# Patient Record
Sex: Female | Born: 1945 | Race: White | Hispanic: No | Smoking: Never smoker
Health system: Southern US, Community
[De-identification: ages and names within clinical notes are randomized; demographics above are authoritative.]

## PROBLEM LIST (undated history)

## (undated) DIAGNOSIS — M8580 Other specified disorders of bone density and structure, unspecified site: Secondary | ICD-10-CM

## (undated) DIAGNOSIS — Z9889 Other specified postprocedural states: Secondary | ICD-10-CM

## (undated) DIAGNOSIS — E785 Hyperlipidemia, unspecified: Secondary | ICD-10-CM

## (undated) DIAGNOSIS — I3139 Other pericardial effusion (noninflammatory): Secondary | ICD-10-CM

## (undated) DIAGNOSIS — J302 Other seasonal allergic rhinitis: Secondary | ICD-10-CM

## (undated) DIAGNOSIS — E119 Type 2 diabetes mellitus without complications: Secondary | ICD-10-CM

## (undated) DIAGNOSIS — I313 Pericardial effusion (noninflammatory): Secondary | ICD-10-CM

## (undated) DIAGNOSIS — R112 Nausea with vomiting, unspecified: Secondary | ICD-10-CM

## (undated) HISTORY — DX: Hyperlipidemia, unspecified: E78.5

## (undated) HISTORY — DX: Type 2 diabetes mellitus without complications: E11.9

## (undated) HISTORY — PX: OTHER SURGICAL HISTORY: SHX169

## (undated) HISTORY — DX: Other specified disorders of bone density and structure, unspecified site: M85.80

## (undated) HISTORY — DX: Other seasonal allergic rhinitis: J30.2

---

## 1999-10-08 ENCOUNTER — Other Ambulatory Visit: Admission: RE | Admit: 1999-10-08 | Discharge: 1999-10-08 | Payer: Self-pay | Admitting: Obstetrics and Gynecology

## 2001-01-06 ENCOUNTER — Other Ambulatory Visit: Admission: RE | Admit: 2001-01-06 | Discharge: 2001-01-06 | Payer: Self-pay | Admitting: Obstetrics and Gynecology

## 2002-01-23 ENCOUNTER — Other Ambulatory Visit: Admission: RE | Admit: 2002-01-23 | Discharge: 2002-01-23 | Payer: Self-pay | Admitting: Obstetrics and Gynecology

## 2003-02-06 ENCOUNTER — Other Ambulatory Visit: Admission: RE | Admit: 2003-02-06 | Discharge: 2003-02-06 | Payer: Self-pay | Admitting: Obstetrics and Gynecology

## 2004-02-25 ENCOUNTER — Other Ambulatory Visit: Admission: RE | Admit: 2004-02-25 | Discharge: 2004-02-25 | Payer: Self-pay | Admitting: Obstetrics and Gynecology

## 2011-02-23 ENCOUNTER — Other Ambulatory Visit (HOSPITAL_COMMUNITY): Payer: Managed Care, Other (non HMO)

## 2011-03-04 ENCOUNTER — Encounter (HOSPITAL_COMMUNITY): Admission: RE | Payer: Self-pay | Source: Ambulatory Visit

## 2011-03-04 ENCOUNTER — Ambulatory Visit (HOSPITAL_COMMUNITY)
Admission: RE | Admit: 2011-03-04 | Payer: Managed Care, Other (non HMO) | Source: Ambulatory Visit | Admitting: Obstetrics and Gynecology

## 2011-03-04 SURGERY — DILATATION & CURETTAGE/HYSTEROSCOPY WITH RESECTOCOPE
Anesthesia: General

## 2011-08-25 ENCOUNTER — Other Ambulatory Visit: Payer: Self-pay | Admitting: Family Medicine

## 2011-08-25 DIAGNOSIS — R109 Unspecified abdominal pain: Secondary | ICD-10-CM

## 2011-08-30 ENCOUNTER — Other Ambulatory Visit: Payer: Managed Care, Other (non HMO)

## 2012-02-16 ENCOUNTER — Encounter (HOSPITAL_COMMUNITY): Payer: Self-pay | Admitting: Pharmacy Technician

## 2012-02-16 ENCOUNTER — Encounter: Payer: Self-pay | Admitting: Cardiothoracic Surgery

## 2012-02-16 ENCOUNTER — Ambulatory Visit (INDEPENDENT_AMBULATORY_CARE_PROVIDER_SITE_OTHER): Payer: Medicare Other | Admitting: Cardiothoracic Surgery

## 2012-02-16 ENCOUNTER — Other Ambulatory Visit: Payer: Self-pay | Admitting: *Deleted

## 2012-02-16 VITALS — BP 107/71 | HR 72 | Resp 18 | Ht 64.25 in | Wt 113.0 lb

## 2012-02-16 DIAGNOSIS — I313 Pericardial effusion (noninflammatory): Secondary | ICD-10-CM

## 2012-02-16 DIAGNOSIS — J302 Other seasonal allergic rhinitis: Secondary | ICD-10-CM | POA: Insufficient documentation

## 2012-02-16 DIAGNOSIS — I319 Disease of pericardium, unspecified: Secondary | ICD-10-CM

## 2012-02-16 DIAGNOSIS — M8580 Other specified disorders of bone density and structure, unspecified site: Secondary | ICD-10-CM | POA: Insufficient documentation

## 2012-02-16 DIAGNOSIS — I3139 Other pericardial effusion (noninflammatory): Secondary | ICD-10-CM | POA: Insufficient documentation

## 2012-02-16 NOTE — Progress Notes (Signed)
PCP is Lupita Raider, MD Referring Provider is Donato Schultz, MD  Chief Complaint  Patient presents with  . Pericardial Effusion    Referral from Dr Anne Fu for surgical eval, Chest Ct on 02/15/12     HPI: 66 year old Caucasian female with no history of malignancy, history of diabetes and hypertension was evaluated by her primary care physician for severe episodic nausea. Ultrasound of the abdomen was negative but did indicate evidence of pericardial fluid. A followup CT scan of the chest showed a large circumferential pericardial effusion. There were no pulmonary nodules, there is no mediastinal adenopathy or other sign of malignancy. There is no evidence of pleural effusion. The patient was evaluated by Dr. Anne Fu and a 2-D echocardiogram confirmed the presence of pericardial effusion without tamponade. She was referred for evaluation for pericardial window and drainage.  Patient denies chest pain, weight loss, fever, cough, symptoms of viral prodrome Patient's husband died earlier this summer of stroke and dialysis-dependent renal failure. He history of hepatitis. The patient herself has no history of hepatitis Her diabetes is fairly well controlled last A1c was 6.9   Past Medical History  Diagnosis Date  . Diabetes mellitus   . Hyperlipidemia   . Seasonal allergies   . Osteopenia     Past Surgical History  Procedure Date  . Right wrist fx   . Rectal polyps removed     Family History  Problem Relation Age of Onset  . Heart attack Father   . Diabetes Brother   . Heart disease Brother   . Cancer Sister     ovarian    Social History History  Substance Use Topics  . Smoking status: Never Smoker   . Smokeless tobacco: Not on file  . Alcohol Use: No    Current Outpatient Prescriptions  Medication Sig Dispense Refill  . insulin glargine (LANTUS) 100 UNIT/ML injection Inject 7 Units into the skin at bedtime.      Marland Kitchen lisinopril (PRINIVIL,ZESTRIL) 5 MG tablet Take 5 mg by  mouth daily.      . metFORMIN (GLUCOPHAGE) 1000 MG tablet Take 1,000 mg by mouth 2 (two) times daily with a meal.      . Multiple Vitamin (MULTIVITAMIN) tablet Take 1 tablet by mouth daily.      . pravastatin (PRAVACHOL) 40 MG tablet Take 40 mg by mouth daily.      . traZODone (DESYREL) 50 MG tablet Take 50 mg by mouth at bedtime.        No Known Allergies  Review of Systems no weight loss fever or change in vision or difficulty swallowing no active dental complaints no history of thoracic trauma History of cardiac arrhythmia angina coronary disease MI CT scan of the chest showed no evidence of aortic pathology or aneurysm No GI history of otitis jaundice blood per No peripheral rash or disease no TIA no DVT no stroke   BP 107/71  Pulse 72  Resp 18  Ht 5' 4.25" (1.632 m)  Wt 113 lb (51.256 kg)  BMI 19.25 kg/m2  SpO2 98% Physical Exam Alert and comfortable HEENT normocephalic dentition good Neck without JVD mass or bruit Lymphatics without palpable adenopathy the neck Thorax without tenderness or deformity breath sounds clear and equal Cardiac rhythm regular murmur rub or gallop Abdomen soft nontender without pulsatile mass no organomegaly Extremities without clubbing cyanosis or edema Vascular palpable pulses in all extremities no varicosities or venous insufficiency Neurologic alert and oriented without focal motor deficit  Diagnostic Tests: The scan of  the chest reviewed, report a 2-D echo performed at [facility] cardiology today is pending but her a report by Dr. 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

## 2012-02-17 MED ORDER — DEXTROSE 5 % IV SOLN
1.5000 g | INTRAVENOUS | Status: AC
  Administered 2012-02-18: 1.5 g via INTRAVENOUS
  Filled 2012-02-17: qty 1.5

## 2012-02-18 ENCOUNTER — Encounter (HOSPITAL_COMMUNITY)
Admission: RE | Disposition: A | Discharge status: Home / Self Care | Payer: Self-pay | Source: Ambulatory Visit | Attending: Cardiothoracic Surgery

## 2012-02-18 ENCOUNTER — Inpatient Hospital Stay (HOSPITAL_COMMUNITY): Payer: Medicare Other | Admitting: Anesthesiology

## 2012-02-18 ENCOUNTER — Inpatient Hospital Stay (HOSPITAL_COMMUNITY): Payer: Medicare Other

## 2012-02-18 ENCOUNTER — Encounter (HOSPITAL_COMMUNITY): Payer: Self-pay | Admitting: *Deleted

## 2012-02-18 ENCOUNTER — Encounter (HOSPITAL_COMMUNITY): Payer: Self-pay | Admitting: Anesthesiology

## 2012-02-18 ENCOUNTER — Inpatient Hospital Stay (HOSPITAL_COMMUNITY)
Admission: RE | Admit: 2012-02-18 | Discharge: 2012-02-23 | DRG: 238 | Disposition: A | Discharge status: Home / Self Care | Payer: Medicare Other | Source: Ambulatory Visit | Attending: Cardiothoracic Surgery | Admitting: Cardiothoracic Surgery

## 2012-02-18 DIAGNOSIS — M899 Disorder of bone, unspecified: Secondary | ICD-10-CM | POA: Diagnosis present

## 2012-02-18 DIAGNOSIS — Z9889 Other specified postprocedural states: Secondary | ICD-10-CM

## 2012-02-18 DIAGNOSIS — I3139 Other pericardial effusion (noninflammatory): Secondary | ICD-10-CM | POA: Diagnosis present

## 2012-02-18 DIAGNOSIS — I313 Pericardial effusion (noninflammatory): Secondary | ICD-10-CM | POA: Diagnosis present

## 2012-02-18 DIAGNOSIS — I319 Disease of pericardium, unspecified: Principal | ICD-10-CM | POA: Diagnosis present

## 2012-02-18 DIAGNOSIS — I441 Atrioventricular block, second degree: Secondary | ICD-10-CM | POA: Diagnosis not present

## 2012-02-18 DIAGNOSIS — I309 Acute pericarditis, unspecified: Secondary | ICD-10-CM

## 2012-02-18 DIAGNOSIS — IMO0001 Reserved for inherently not codable concepts without codable children: Secondary | ICD-10-CM | POA: Diagnosis present

## 2012-02-18 HISTORY — PX: SUBXYPHOID PERICARDIAL WINDOW: SHX5075

## 2012-02-18 HISTORY — DX: Other pericardial effusion (noninflammatory): I31.39

## 2012-02-18 HISTORY — DX: Pericardial effusion (noninflammatory): I31.3

## 2012-02-18 HISTORY — DX: Other specified postprocedural states: Z98.890

## 2012-02-18 HISTORY — DX: Nausea with vomiting, unspecified: R11.2

## 2012-02-18 LAB — COMPREHENSIVE METABOLIC PANEL
ALT: 13 U/L (ref 0–35)
AST: 19 U/L (ref 0–37)
Albumin: 4 g/dL (ref 3.5–5.2)
Alkaline Phosphatase: 68 U/L (ref 39–117)
BUN: 14 mg/dL (ref 6–23)
CO2: 24 mEq/L (ref 19–32)
Calcium: 9.6 mg/dL (ref 8.4–10.5)
Chloride: 102 mEq/L (ref 96–112)
Creatinine, Ser: 0.43 mg/dL — ABNORMAL LOW (ref 0.50–1.10)
GFR calc Af Amer: >90 mL/min (ref >90)
GFR calc non Af Amer: >90 mL/min (ref >90)
Glucose, Bld: 222 mg/dL — ABNORMAL HIGH (ref 70–99)
Potassium: 4.1 mEq/L (ref 3.5–5.1)
Sodium: 138 mEq/L (ref 135–145)
Total Bilirubin: 0.2 mg/dL — ABNORMAL LOW (ref 0.3–1.2)
Total Protein: 7.2 g/dL (ref 6.0–8.3)

## 2012-02-18 LAB — BLOOD GAS, ARTERIAL
Acid-Base Excess: 2.3 mmol/L — ABNORMAL HIGH (ref 0.0–2.0)
Bicarbonate: 26.4 mEq/L — ABNORMAL HIGH (ref 20.0–24.0)
Drawn by: 181601
FIO2: 0.21 %
O2 Saturation: 97.9 %
Patient temperature: 98.6
TCO2: 27.6 mmol/L (ref 0–100)
pCO2 arterial: 41 mmHg (ref 35.0–45.0)
pH, Arterial: 7.424 (ref 7.350–7.450)
pO2, Arterial: 102 mmHg — ABNORMAL HIGH (ref 80.0–100.0)

## 2012-02-18 LAB — PROTIME-INR
INR: 0.96 (ref 0.00–1.49)
Prothrombin Time: 12.7 seconds (ref 11.6–15.2)

## 2012-02-18 LAB — GLUCOSE, CAPILLARY
Glucose-Capillary: 116 mg/dL — ABNORMAL HIGH (ref 70–99)
Glucose-Capillary: 159 mg/dL — ABNORMAL HIGH (ref 70–99)
Glucose-Capillary: 159 mg/dL — ABNORMAL HIGH (ref 70–99)
Glucose-Capillary: 190 mg/dL — ABNORMAL HIGH (ref 70–99)
Glucose-Capillary: 97 mg/dL (ref 70–99)

## 2012-02-18 LAB — CBC
HCT: 34.6 % — ABNORMAL LOW (ref 36.0–46.0)
Hemoglobin: 11.5 g/dL — ABNORMAL LOW (ref 12.0–15.0)
MCH: 29.1 pg (ref 26.0–34.0)
MCHC: 33.2 g/dL (ref 30.0–36.0)
MCV: 87.6 fL (ref 78.0–100.0)
Platelets: 293 10*3/uL (ref 150–400)
RBC: 3.95 MIL/uL (ref 3.87–5.11)
RDW: 13 % (ref 11.5–15.5)
WBC: 6.3 10*3/uL (ref 4.0–10.5)

## 2012-02-18 LAB — URINALYSIS, ROUTINE W REFLEX MICROSCOPIC
Bilirubin Urine: NEGATIVE
Glucose, UA: NEGATIVE mg/dL
Hgb urine dipstick: NEGATIVE
Ketones, ur: NEGATIVE mg/dL
Leukocytes, UA: NEGATIVE
Nitrite: NEGATIVE
Protein, ur: NEGATIVE mg/dL
Specific Gravity, Urine: 1.024 (ref 1.005–1.030)
Urobilinogen, UA: 1 mg/dL (ref 0.0–1.0)
pH: 7 (ref 5.0–8.0)

## 2012-02-18 LAB — SURGICAL PCR SCREEN
MRSA, PCR: NEGATIVE
Staphylococcus aureus: NEGATIVE

## 2012-02-18 LAB — TYPE AND SCREEN
ABO/RH(D): A NEG
Antibody Screen: NEGATIVE

## 2012-02-18 LAB — APTT: aPTT: 27 seconds (ref 24–37)

## 2012-02-18 LAB — ABO/RH: ABO/RH(D): A NEG

## 2012-02-18 SURGERY — CREATION, PERICARDIAL WINDOW, SUBXIPHOID APPROACH
Anesthesia: General | Site: Chest | Wound class: Clean

## 2012-02-18 MED ORDER — GLYCOPYRROLATE 0.2 MG/ML IJ SOLN
INTRAMUSCULAR | Status: DC | PRN
  Administered 2012-02-18: .6 mg via INTRAVENOUS

## 2012-02-18 MED ORDER — BISACODYL 5 MG PO TBEC
10.0000 mg | DELAYED_RELEASE_TABLET | Freq: Every day | ORAL | Status: DC
  Administered 2012-02-18 – 2012-02-23 (×6): 10 mg via ORAL
  Filled 2012-02-18 (×6): qty 2

## 2012-02-18 MED ORDER — ADULT MULTIVITAMIN W/MINERALS CH
1.0000 | ORAL_TABLET | Freq: Every day | ORAL | Status: DC
  Administered 2012-02-19 – 2012-02-23 (×5): 1 via ORAL
  Filled 2012-02-18 (×5): qty 1

## 2012-02-18 MED ORDER — MIDAZOLAM HCL 2 MG/2ML IJ SOLN
1.0000 mg | INTRAMUSCULAR | Status: DC | PRN
  Administered 2012-02-18: 2 mg via INTRAVENOUS

## 2012-02-18 MED ORDER — PANTOPRAZOLE SODIUM 40 MG PO TBEC
40.0000 mg | DELAYED_RELEASE_TABLET | Freq: Every day | ORAL | Status: DC
  Administered 2012-02-18 – 2012-02-23 (×6): 40 mg via ORAL
  Filled 2012-02-18 (×6): qty 1

## 2012-02-18 MED ORDER — ACETAMINOPHEN 10 MG/ML IV SOLN
INTRAVENOUS | Status: AC
  Administered 2012-02-18: 1000 mg via INTRAVENOUS
  Filled 2012-02-18: qty 100

## 2012-02-18 MED ORDER — PROPOFOL 10 MG/ML IV BOLUS
INTRAVENOUS | Status: DC | PRN
  Administered 2012-02-18: 170 mg via INTRAVENOUS

## 2012-02-18 MED ORDER — INSULIN ASPART 100 UNIT/ML ~~LOC~~ SOLN
0.0000 [IU] | SUBCUTANEOUS | Status: DC
  Administered 2012-02-18: 4 [IU] via SUBCUTANEOUS
  Administered 2012-02-18 – 2012-02-19 (×2): 2 [IU] via SUBCUTANEOUS
  Administered 2012-02-19: 4 [IU] via SUBCUTANEOUS
  Administered 2012-02-19: 12 [IU] via SUBCUTANEOUS

## 2012-02-18 MED ORDER — FENTANYL CITRATE 0.05 MG/ML IJ SOLN
INTRAMUSCULAR | Status: AC
  Filled 2012-02-18: qty 2

## 2012-02-18 MED ORDER — OXYCODONE HCL 5 MG PO TABS: 5.0000 mg | ORAL_TABLET | Freq: Once | ORAL | Status: DC | PRN

## 2012-02-18 MED ORDER — POTASSIUM CHLORIDE IN NACL 20-0.9 MEQ/L-% IV SOLN
INTRAVENOUS | Status: DC
  Administered 2012-02-18 – 2012-02-19 (×2): via INTRAVENOUS
  Filled 2012-02-18 (×5): qty 1000

## 2012-02-18 MED ORDER — MIDAZOLAM HCL 2 MG/2ML IJ SOLN: 1.0000 mg | INTRAMUSCULAR | Status: DC | PRN

## 2012-02-18 MED ORDER — MUPIROCIN 2 % EX OINT
TOPICAL_OINTMENT | Freq: Two times a day (BID) | CUTANEOUS | Status: DC
  Administered 2012-02-18: 09:00:00 via NASAL
  Filled 2012-02-18: qty 22

## 2012-02-18 MED ORDER — POTASSIUM CHLORIDE 10 MEQ/50ML IV SOLN: 10.0000 meq | Freq: Every day | INTRAVENOUS | Status: DC | PRN

## 2012-02-18 MED ORDER — LISINOPRIL 5 MG PO TABS
5.0000 mg | ORAL_TABLET | Freq: Every day | ORAL | Status: DC
  Administered 2012-02-20 – 2012-02-23 (×4): 5 mg via ORAL
  Filled 2012-02-18 (×4): qty 1

## 2012-02-18 MED ORDER — FENTANYL CITRATE 0.05 MG/ML IJ SOLN
INTRAMUSCULAR | Status: DC | PRN
  Administered 2012-02-18: 100 ug via INTRAVENOUS
  Administered 2012-02-18: 50 ug via INTRAVENOUS
  Administered 2012-02-18: 25 ug via INTRAVENOUS
  Administered 2012-02-18: 75 ug via INTRAVENOUS

## 2012-02-18 MED ORDER — TRAMADOL HCL 50 MG PO TABS
50.0000 mg | ORAL_TABLET | Freq: Four times a day (QID) | ORAL | Status: DC | PRN
  Administered 2012-02-19: 50 mg via ORAL
  Filled 2012-02-18: qty 1

## 2012-02-18 MED ORDER — FENTANYL CITRATE 0.05 MG/ML IJ SOLN
100.0000 ug | Freq: Once | INTRAMUSCULAR | Status: AC
Start: 2012-02-18 — End: 2012-02-18
  Administered 2012-02-18: 100 ug via INTRAVENOUS

## 2012-02-18 MED ORDER — HYDROMORPHONE HCL PF 1 MG/ML IJ SOLN
0.2500 mg | INTRAMUSCULAR | Status: DC | PRN
  Administered 2012-02-18 (×4): 0.5 mg via INTRAVENOUS

## 2012-02-18 MED ORDER — OXYCODONE HCL 5 MG/5ML PO SOLN: 5.0000 mg | Freq: Once | ORAL | Status: DC | PRN

## 2012-02-18 MED ORDER — MEPERIDINE HCL 25 MG/ML IJ SOLN: 6.2500 mg | INTRAMUSCULAR | Status: DC | PRN

## 2012-02-18 MED ORDER — PROMETHAZINE HCL 25 MG/ML IJ SOLN
INTRAMUSCULAR | Status: AC
  Filled 2012-02-18: qty 1

## 2012-02-18 MED ORDER — LACTATED RINGERS IV SOLN
INTRAVENOUS | Status: DC
  Administered 2012-02-18: 11:00:00 via INTRAVENOUS

## 2012-02-18 MED ORDER — LACTATED RINGERS IV SOLN
INTRAVENOUS | Status: DC | PRN
  Administered 2012-02-18: 11:00:00 via INTRAVENOUS

## 2012-02-18 MED ORDER — NEOSTIGMINE METHYLSULFATE 1 MG/ML IJ SOLN
INTRAMUSCULAR | Status: DC | PRN
  Administered 2012-02-18: 4 mg via INTRAVENOUS

## 2012-02-18 MED ORDER — EPHEDRINE SULFATE 50 MG/ML IJ SOLN
INTRAMUSCULAR | Status: DC | PRN
  Administered 2012-02-18: 10 mg via INTRAVENOUS

## 2012-02-18 MED ORDER — OXYCODONE-ACETAMINOPHEN 5-325 MG PO TABS
1.0000 | ORAL_TABLET | ORAL | Status: DC | PRN
  Administered 2012-02-19 – 2012-02-21 (×6): 2 via ORAL
  Administered 2012-02-22: 1 via ORAL
  Filled 2012-02-18 (×2): qty 2
  Filled 2012-02-18: qty 1
  Filled 2012-02-18 (×6): qty 2

## 2012-02-18 MED ORDER — DIPHENHYDRAMINE HCL 50 MG/ML IJ SOLN
INTRAMUSCULAR | Status: DC | PRN
  Administered 2012-02-18: 12.5 mg via INTRAVENOUS

## 2012-02-18 MED ORDER — PROMETHAZINE HCL 25 MG/ML IJ SOLN
6.2500 mg | INTRAMUSCULAR | Status: DC | PRN
  Administered 2012-02-18: 6.25 mg via INTRAVENOUS

## 2012-02-18 MED ORDER — MIDAZOLAM HCL 2 MG/2ML IJ SOLN
INTRAMUSCULAR | Status: AC
  Filled 2012-02-18: qty 2

## 2012-02-18 MED ORDER — FENTANYL CITRATE 0.05 MG/ML IJ SOLN
25.0000 ug | INTRAMUSCULAR | Status: DC | PRN
  Administered 2012-02-18 – 2012-02-19 (×3): 50 ug via INTRAVENOUS
  Filled 2012-02-18 (×3): qty 2

## 2012-02-18 MED ORDER — LIDOCAINE HCL (CARDIAC) 20 MG/ML IV SOLN
INTRAVENOUS | Status: DC | PRN
  Administered 2012-02-18: 50 mg via INTRAVENOUS

## 2012-02-18 MED ORDER — ONDANSETRON HCL 4 MG/2ML IJ SOLN
INTRAMUSCULAR | Status: DC | PRN
  Administered 2012-02-18: 4 mg via INTRAVENOUS

## 2012-02-18 MED ORDER — ONDANSETRON HCL 4 MG/2ML IJ SOLN
4.0000 mg | Freq: Four times a day (QID) | INTRAMUSCULAR | Status: DC | PRN
  Administered 2012-02-19: 4 mg via INTRAVENOUS
  Filled 2012-02-18 (×2): qty 2

## 2012-02-18 MED ORDER — ACETAMINOPHEN 10 MG/ML IV SOLN
1000.0000 mg | Freq: Four times a day (QID) | INTRAVENOUS | Status: AC
  Administered 2012-02-18 – 2012-02-19 (×4): 1000 mg via INTRAVENOUS
  Filled 2012-02-18 (×2): qty 100

## 2012-02-18 MED ORDER — FENTANYL CITRATE 0.05 MG/ML IJ SOLN: 50.0000 ug | INTRAMUSCULAR | Status: DC | PRN

## 2012-02-18 MED ORDER — ATROPINE SULFATE 0.1 MG/ML IJ SOLN
INTRAMUSCULAR | Status: AC
  Filled 2012-02-18: qty 10

## 2012-02-18 MED ORDER — HYDROMORPHONE HCL PF 1 MG/ML IJ SOLN
INTRAMUSCULAR | Status: AC
  Filled 2012-02-18: qty 1

## 2012-02-18 MED ORDER — SENNOSIDES-DOCUSATE SODIUM 8.6-50 MG PO TABS
1.0000 | ORAL_TABLET | Freq: Every evening | ORAL | Status: DC | PRN
  Administered 2012-02-18: 1 via ORAL
  Filled 2012-02-18: qty 1

## 2012-02-18 MED ORDER — SIMVASTATIN 20 MG PO TABS
20.0000 mg | ORAL_TABLET | Freq: Every day | ORAL | Status: DC
  Administered 2012-02-18 – 2012-02-22 (×5): 20 mg via ORAL
  Filled 2012-02-18 (×6): qty 1

## 2012-02-18 MED ORDER — ROCURONIUM BROMIDE 100 MG/10ML IV SOLN
INTRAVENOUS | Status: DC | PRN
  Administered 2012-02-18: 50 mg via INTRAVENOUS

## 2012-02-18 MED ORDER — ONE-DAILY MULTI VITAMINS PO TABS: 1.0000 | ORAL_TABLET | Freq: Every day | ORAL | Status: DC

## 2012-02-18 MED ORDER — OXYCODONE HCL 5 MG PO TABS
5.0000 mg | ORAL_TABLET | ORAL | Status: AC | PRN
  Administered 2012-02-18 – 2012-02-19 (×5): 10 mg via ORAL
  Filled 2012-02-18 (×5): qty 2

## 2012-02-18 MED ORDER — DEXTROSE 5 % IV SOLN
1.5000 g | Freq: Two times a day (BID) | INTRAVENOUS | Status: AC
  Administered 2012-02-18 – 2012-02-19 (×2): 1.5 g via INTRAVENOUS
  Filled 2012-02-18 (×2): qty 1.5

## 2012-02-18 SURGICAL SUPPLY — 48 items
APL SKNCLS STERI-STRIP NONHPOA (GAUZE/BANDAGES/DRESSINGS) ×1
ATTRACTOMAT 16X20 MAGNETIC DRP (DRAPES) ×3 IMPLANT
BENZOIN TINCTURE PRP APPL 2/3 (GAUZE/BANDAGES/DRESSINGS) ×3 IMPLANT
CANISTER SUCTION 2500CC (MISCELLANEOUS) ×3 IMPLANT
CATH THORACIC 28FR (CATHETERS) ×2 IMPLANT
CATH THORACIC 28FR RT ANG (CATHETERS) IMPLANT
CATH THORACIC 36FR (CATHETERS) IMPLANT
CATH THORACIC 36FR RT ANG (CATHETERS) IMPLANT
CLIP TI MEDIUM 24 (CLIP) ×2 IMPLANT
CLIP TI WIDE RED SMALL 24 (CLIP) ×2 IMPLANT
CLOSURE WOUND 1/2 X4 (GAUZE/BANDAGES/DRESSINGS) ×1
CLOTH BEACON ORANGE TIMEOUT ST (SAFETY) ×3 IMPLANT
CONT SPEC 4OZ CLIKSEAL STRL BL (MISCELLANEOUS) ×4 IMPLANT
COVER SURGICAL LIGHT HANDLE (MISCELLANEOUS) ×6 IMPLANT
DRAIN CHANNEL 28F RND 3/8 FF (WOUND CARE) ×3 IMPLANT
DRAPE LAPAROSCOPIC ABDOMINAL (DRAPES) ×3 IMPLANT
DRAPE PROXIMA HALF (DRAPES) ×3 IMPLANT
ELECT REM PT RETURN 9FT ADLT (ELECTROSURGICAL) ×3
ELECTRODE REM PT RTRN 9FT ADLT (ELECTROSURGICAL) ×1 IMPLANT
GLOVE BIO SURGEON STRL SZ7.5 (GLOVE) ×6 IMPLANT
HEMOSTAT POWDER SURGIFOAM 1G (HEMOSTASIS) IMPLANT
KIT BASIN OR (CUSTOM PROCEDURE TRAY) ×3 IMPLANT
KIT ROOM TURNOVER OR (KITS) ×3 IMPLANT
NS IRRIG 1000ML POUR BTL (IV SOLUTION) ×3 IMPLANT
PACK CHEST (CUSTOM PROCEDURE TRAY) ×3 IMPLANT
PAD ARMBOARD 7.5X6 YLW CONV (MISCELLANEOUS) ×6 IMPLANT
PAD ELECT DEFIB RADIOL ZOLL (MISCELLANEOUS) ×3 IMPLANT
SPONGE GAUZE 4X4 12PLY (GAUZE/BANDAGES/DRESSINGS) ×2 IMPLANT
STRIP CLOSURE SKIN 1/2X4 (GAUZE/BANDAGES/DRESSINGS) ×2 IMPLANT
SUT SILK  1 MH (SUTURE) ×4
SUT SILK 1 MH (SUTURE) IMPLANT
SUT SILK 2 0 SH CR/8 (SUTURE) ×3 IMPLANT
SUT VIC AB 1 CTX 18 (SUTURE) ×3 IMPLANT
SUT VIC AB 1 CTX 36 (SUTURE) ×3
SUT VIC AB 1 CTX36XBRD ANBCTR (SUTURE) ×1 IMPLANT
SUT VIC AB 3-0 X1 27 (SUTURE) ×3 IMPLANT
SWAB COLLECTION DEVICE MRSA (MISCELLANEOUS) IMPLANT
SYR 50ML SLIP (SYRINGE) IMPLANT
SYRINGE 10CC LL (SYRINGE) IMPLANT
SYSTEM SAHARA CHEST DRAIN ATS (WOUND CARE) ×2 IMPLANT
TAPE CLOTH SOFT 2X10 (GAUZE/BANDAGES/DRESSINGS) ×2 IMPLANT
TOWEL OR 17X24 6PK STRL BLUE (TOWEL DISPOSABLE) ×3 IMPLANT
TOWEL OR 17X26 10 PK STRL BLUE (TOWEL DISPOSABLE) ×9 IMPLANT
TRAP SPECIMEN MUCOUS 40CC (MISCELLANEOUS) ×4 IMPLANT
TRAY FOLEY CATH 14FRSI W/METER (CATHETERS) ×3 IMPLANT
TRAY FOLEY IC TEMP SENS 14FR (CATHETERS) ×3 IMPLANT
TUBE ANAEROBIC SPECIMEN COL (MISCELLANEOUS) IMPLANT
WATER STERILE IRR 1000ML POUR (IV SOLUTION) ×6 IMPLANT

## 2012-02-18 NOTE — Progress Notes (Signed)
  Echocardiogram Echocardiogram Transesophageal has been performed.  Audrey [name] 02/18/2012, 11:52 AM

## 2012-02-18 NOTE — Anesthesia Preprocedure Evaluation (Addendum)
Anesthesia Evaluation    Airway Mallampati: II  Neck ROM: Full    Dental  (+) Teeth Intact   Pulmonary neg pulmonary ROS,  breath sounds clear to auscultation        Cardiovascular Rhythm:Regular Rate:Normal  Pericardial effusion   Neuro/Psych    GI/Hepatic   Endo/Other  diabetes  Renal/GU      Musculoskeletal   Abdominal   Peds  Hematology  (+) Blood dyscrasia, anemia ,   Anesthesia Other Findings   Reproductive/Obstetrics                          Anesthesia Physical Anesthesia Plan  ASA: III  Anesthesia Plan: General   Post-op Pain Management:    Induction: Intravenous  Airway Management Planned: Oral ETT  Additional Equipment: Arterial line, CVP and TEE  Intra-op Plan:   Post-operative Plan: Extubation in OR  Informed Consent:   Dental advisory given  Plan Discussed with: CRNA and Surgeon  Anesthesia Plan Comments:        Anesthesia Quick Evaluation

## 2012-02-18 NOTE — Progress Notes (Signed)
TCTS BRIEF SICU PROGRESS NOTE  Day of Surgery  S/P Procedure(s) (LRB): SUBXYPHOID PERICARDIAL WINDOW (N/A)   Feels comfortable NSR w/ stable BP O2 sats 97% UOP adequate  Plan: Continue routine early postop  Seven Dollens H 02/18/2012 6:44 PM

## 2012-02-18 NOTE — Transfer of Care (Signed)
Immediate Anesthesia Transfer of Care Note  Patient: Audrey [name]  Procedure(s) Performed: Procedure(s) (LRB) with comments: SUBXYPHOID PERICARDIAL WINDOW (N/A) - TEE  Patient Location: PACU  Anesthesia Type:General  Level of Consciousness: awake and alert   Airway & Oxygen Therapy: Patient Spontanous Breathing and Patient connected to face mask oxygen  Post-op Assessment: Report given to PACU RN, Post -op Vital signs reviewed and stable and Patient moving all extremities X 4  Post vital signs: Reviewed and stable  Complications: No apparent anesthesia complications

## 2012-02-18 NOTE — Consult Note (Addendum)
Admit date: 02/18/2012 Referring Physician    Dr. Donata Clay Primary Physician  Dr. Cam Hai Primary Cardiologist  Dr. Donato Schultz Reason for Consultation  Heart block  HPI: This is a [phone] WF with a history of DM, dyslipidemia and recent episodic nausea with recent abdominal US showing pericardial fluid and followup CT showed large pericardial effusion.  A 2D echo confirmed large pericardial effusion with no tamponade.  SHe presented today and underwent pericardial window with drain.  Post-op she had some transient type I second degree AV block c/w Wenkebach block which has resolved.  Currently she is hemodynamically stable.       PMH:   Past Medical History  Diagnosis Date  . Diabetes mellitus   . Hyperlipidemia   . Seasonal allergies   . Osteopenia   . PONV (postoperative nausea and vomiting)   . Pericardial effusion      PSH:   Past Surgical History  Procedure Date  . Right wrist fx   . Rectal polyps removed     Allergies:  Review of patient's allergies indicates no known allergies. Prior to Admit Meds:   Prescriptions prior to admission  Medication Sig Dispense Refill  . Ascorbic Acid (VITAMIN C PO) Take 1 tablet by mouth daily.      Marland Kitchen CINNAMON PO Take 1 tablet by mouth daily.      . insulin glargine (LANTUS) 100 UNIT/ML injection Inject 7 Units into the skin at bedtime.      Marland Kitchen lisinopril (PRINIVIL,ZESTRIL) 5 MG tablet Take 5 mg by mouth daily.      . metFORMIN (GLUCOPHAGE) 1000 MG tablet Take 1,000 mg by mouth 2 (two) times daily with a meal.      . Multiple Vitamin (MULTIVITAMIN) tablet Take 1 tablet by mouth daily.      . pravastatin (PRAVACHOL) 40 MG tablet Take 40 mg by mouth daily.      . traZODone (DESYREL) 50 MG tablet Take 50 mg by mouth at bedtime as needed. For sleep       Fam HX:    Family History  Problem Relation Age of Onset  . Heart attack Father   . Diabetes Brother   . Heart disease Brother   . Cancer Sister     ovarian   Social HX:     History   Social History  . Marital Status: Widowed    Spouse Name: died Aug 14, 2011    Number of Children: 1  . Years of Education: N/A   Occupational History  . computer/ desk top    Social History Main Topics  . Smoking status: Never Smoker   . Smokeless tobacco: Not on file  . Alcohol Use: No  . Drug Use: No  . Sexually Active: Not on file   Other Topics Concern  . Not on file   Social History Narrative  . No narrative on file     ROS:  All 11 ROS were addressed and are negative except what is stated in the HPI  Physical Exam: Blood pressure 130/77, pulse 92, temperature 97.5 F (36.4 C), temperature source Oral, resp. rate 25, height 5\' 4"  (1.626 m), weight 49.8 kg (109 lb 12.6 oz), SpO2 100.00%.    General: Well developed, well nourished, in no acute distress Head: Eyes PERRLA, No xanthomas.   Normal cephalic and atramatic  Lungs:   Clear bilaterally to auscultation and percussion. Heart:   HRRR S1 S2 Pulses are 2+ & equal.  No carotid bruit. No JVD.  No abdominal bruits. No femoral bruits. Abdomen: Bowel sounds are positive, abdomen soft and non-tender without masses Extremities:   No clubbing, cyanosis or edema.  DP +1 Neuro: Alert and oriented X 3. Psych:  Good affect, responds appropriately    Labs:   Lab Results  Component Value Date   WBC 6.3 02/18/2012   HGB 11.5* 02/18/2012   HCT 34.6* 02/18/2012   MCV 87.6 02/18/2012   PLT 293 02/18/2012    Lab 02/18/12 0730  NA 138  K 4.1  CL 102  CO2 24  BUN 14  CREATININE 0.43*  CALCIUM 9.6  PROT 7.2  BILITOT 0.2*  ALKPHOS 68  ALT 13  AST 19  GLUCOSE 222*   No results found for this basename: PTT   Lab Results  Component Value Date   INR 0.96 02/18/2012       Radiology:  Dg Chest 2 View  02/18/2012  *RADIOLOGY REPORT*  Clinical Data: Preoperative radiograph.  Pericardial effusion.  CHEST - 2 VIEW  Comparison: 09/14/2005  Findings: Prominent cardiac contour.  Mediastinal contours  otherwise within normal range.  Lungs are predominately clear. Unchanged small infrahilar density which may reflect a vessel on end.  No pleural effusion or pneumothorax. No acute osseous finding.  IMPRESSION: Prominent cardiac contour.  Pericardial effusion is not excluded.   Original Report Authenticated By: Jearld Lesch, M.D.    Dg Chest Portable 1 View  02/18/2012  *RADIOLOGY REPORT*  Clinical Data: Pericardial effusion.  Status post pericardial window and a central line placement.  PORTABLE CHEST - 1 VIEW  Comparison: 02/18/2012  Findings: Two chest tubes are in place.  Tiny right apical pneumothorax.  Central venous catheter tip is in the superior vena cava at the level of the azygos vein.  The heart size and pulmonary vascularity are normal.  No infiltrates.  No effusions.  IMPRESSION: Tubes and central line good position.  Tiny right apical pneumothorax.   Original Report Authenticated By: Francene Boyers, M.D.     EKG:  NSR with transient type I second degree AV block  ASSESSMENT:  1.  Transient type I second degree AV block c/w Wenkebach block most likely vagal in origin and has now resolved.  She is on no AV nodal blocking agents at home. 2.  Large Pericardial effusion s/p window and drain - cultures and cytology pending 3.  DM  PLAN:   1.  Continue to monitor on telemetry - no indication for temporary pacer at this time 2.  Check TSH  Quintella Reichert, MD  02/18/2012  4:07 PM

## 2012-02-18 NOTE — Brief Op Note (Signed)
02/18/2012  12:24 PM  PATIENT:  Audrey [name]  66 y.o. female  PRE-OPERATIVE DIAGNOSIS:  PERICARDIAL EFFUSION  POST-OPERATIVE DIAGNOSIS:  PERICARDIAL EFFUSION  PROCEDURE:  TEE, SUBXYPHOID PERICARDIAL WINDOW    SURGEON:  Surgeon(s) and Role:    * Kerin Perna, MD - Primary  PHYSICIAN ASSISTANT: Doree Fudge PA-C   ANESTHESIA:   general  EBL:  Total I/O In: 1200 [I.V.:1200] Out: 350 [Urine:350]  BLOOD ADMINISTERED:none  DRAINS: One 67 French Chest Tube(s) in the right pleural space and (One 28 ) Blake drain(s) in the pericardial space   LOCAL MEDICATIONS USED:  NONE  SPECIMEN:  Source of Specimen:  Pericardial biopsies  DISPOSITION OF SPECIMEN:  PATHOLOGY. Cultures and cytology also sent  COUNTS CORRECT:  YES  DICTATION: .Dragon Dictation  PLAN OF CARE: Admit to inpatient   PATIENT DISPOSITION:  PACU - hemodynamically stable.   Delay start of Pharmacological VTE agent (>24hrs) due to surgical blood loss or risk of bleeding: yes

## 2012-02-18 NOTE — Progress Notes (Signed)
The patient was examined and preop studies reviewed. There has been no change from the prior exam and the patient is ready for surgery.  Plan pericardial window on [facility]

## 2012-02-18 NOTE — Anesthesia Postprocedure Evaluation (Signed)
  Anesthesia Post-op Note  Patient: Audrey [name]  Procedure(s) Performed: Procedure(s) (LRB) with comments: SUBXYPHOID PERICARDIAL WINDOW (N/A) - TEE  Patient Location: PACU  Anesthesia Type:General  Level of Consciousness: awake  Airway and Oxygen Therapy: Patient Spontanous Breathing  Post-op Pain: mild  Post-op Assessment: Post-op Vital signs reviewed  Post-op Vital Signs: stable  Complications: No apparent anesthesia complications

## 2012-02-19 DIAGNOSIS — I319 Disease of pericardium, unspecified: Principal | ICD-10-CM

## 2012-02-19 LAB — BASIC METABOLIC PANEL
BUN: 7 mg/dL (ref 6–23)
Calcium: 9 mg/dL (ref 8.4–10.5)
GFR calc non Af Amer: >90 mL/min (ref >90)
Glucose, Bld: 177 mg/dL — ABNORMAL HIGH (ref 70–99)
Potassium: 4.3 mEq/L (ref 3.5–5.1)

## 2012-02-19 LAB — POCT I-STAT 3, ART BLOOD GAS (G3+)
Acid-Base Excess: 1 mmol/L (ref 0.0–2.0)
Bicarbonate: 25.9 mEq/L — ABNORMAL HIGH (ref 20.0–24.0)
O2 Saturation: 96 %
Patient temperature: 99
TCO2: 27 mmol/L (ref 0–100)
pCO2 arterial: 43.1 mmHg (ref 35.0–45.0)
pH, Arterial: 7.388 (ref 7.350–7.450)
pO2, Arterial: 86 mmHg (ref 80.0–100.0)

## 2012-02-19 LAB — GLUCOSE, CAPILLARY
Glucose-Capillary: 150 mg/dL — ABNORMAL HIGH (ref 70–99)
Glucose-Capillary: 189 mg/dL — ABNORMAL HIGH (ref 70–99)
Glucose-Capillary: 257 mg/dL — ABNORMAL HIGH (ref 70–99)
Glucose-Capillary: 97 mg/dL (ref 70–99)
Glucose-Capillary: 192 mg/dL — ABNORMAL HIGH (ref 70–99)

## 2012-02-19 LAB — CBC
HCT: 36.3 % (ref 36.0–46.0)
Hemoglobin: 12.3 g/dL (ref 12.0–15.0)
MCH: 29.6 pg (ref 26.0–34.0)
MCHC: 33.9 g/dL (ref 30.0–36.0)
MCV: 87.5 fL (ref 78.0–100.0)

## 2012-02-19 LAB — TSH: TSH: 1.637 u[IU]/mL (ref 0.350–4.500)

## 2012-02-19 MED ORDER — INSULIN ASPART 100 UNIT/ML ~~LOC~~ SOLN
0.0000 [IU] | Freq: Three times a day (TID) | SUBCUTANEOUS | Status: DC
  Administered 2012-02-20: 5 [IU] via SUBCUTANEOUS
  Administered 2012-02-20: 2 [IU] via SUBCUTANEOUS
  Administered 2012-02-20 – 2012-02-22 (×4): 3 [IU] via SUBCUTANEOUS
  Administered 2012-02-22: 5 [IU] via SUBCUTANEOUS
  Administered 2012-02-23: 2 [IU] via SUBCUTANEOUS

## 2012-02-19 MED ORDER — INSULIN GLARGINE 100 UNIT/ML ~~LOC~~ SOLN
7.0000 [IU] | Freq: Every day | SUBCUTANEOUS | Status: DC
  Administered 2012-02-19 – 2012-02-20 (×2): 7 [IU] via SUBCUTANEOUS

## 2012-02-19 MED ORDER — INSULIN ASPART 100 UNIT/ML ~~LOC~~ SOLN
0.0000 [IU] | Freq: Every day | SUBCUTANEOUS | Status: DC
  Administered 2012-02-21: 2 [IU] via SUBCUTANEOUS

## 2012-02-19 MED ORDER — TRAZODONE HCL 50 MG PO TABS
50.0000 mg | ORAL_TABLET | Freq: Every evening | ORAL | Status: DC | PRN
  Filled 2012-02-19: qty 1

## 2012-02-19 NOTE — Op Note (Signed)
NAME:  Audrey, [name] NO.:  [id number]  MEDICAL RECORD NO.:  [id number]  LOCATION:  2314                         FACILITY:  [facility]  PHYSICIAN:  Burna Forts, M.D.DATE OF BIRTH:  Oct 14, 1945  DATE OF PROCEDURE:  02/18/2012 DATE OF DISCHARGE:                              OPERATIVE REPORT   INDICATIONS FOR PROCEDURE:  Ms. Audrey [name] is a 66 year old female who presents today for drainage of a presumed pericardial effusion.  She was brought to the holding area the day of surgery where under local anesthesia, central line and arterial lines are placed.  We have been asked by Dr. Kathlee Nations Trigt, her cardiothoracic surgeon, to place the TEE probe intraoperatively for assessment of effusion in cardiac structures and function.  After induction of general anesthesia, the TEE probe was prepared and passed oropharyngeally into the stomach and withdrawn slightly for imaging of the cardiac structures.  On initial exam after insertion of the TEE, there is seem to be a large pericardial effusion.  There is a small cardiac silhouette seen in short- axis view within this large effusion.  In the short axis view, there is a circumferential effusion appreciated.  There is a larger effusion noted posteriorly with width of 4.5 cm.  Anteriorly and laterally, the effusion is only 1-1.5 cm in depth.  Again, this is seen in the short- axis view.  Attention was then turned to the cardiac structures themselves and of note:  The left ventricular chamber seen in the short axis view as a normal functioning chamber, incised and structured.  The right ventricular chamber is the same.  All valvular structures are normal in appearance and function.  On views of the atria, of the right atrium does demonstrate some collapse of the free atrial wall during the cardiac cycle.  Overall, this is a normal cardiac structure in both function and formed.  The procedure is begun.  The drainage of the  effusion is carried out. An postcardiac TEE examination shows essentially no effusion left with well-outlined cardiac structures seen in both short and long axis views. The effusion is now gone.  The patient is returned to the PACU in stable condition.          ______________________________ Burna Forts, M.D.     JTM/MEDQ  D:  02/18/2012  T:  02/19/2012  Job:  [id number]

## 2012-02-19 NOTE — Op Note (Signed)
NAMEMarland Kitchen  Audrey [name], Audrey [name] NO.:  [id number]  MEDICAL RECORD NO.:  [id number]  LOCATION:  2314                         FACILITY:  [facility]  PHYSICIAN:  Kerin Perna, M.D.  DATE OF BIRTH:  1945-06-04  DATE OF PROCEDURE:  02/18/2012 DATE OF DISCHARGE:                              OPERATIVE REPORT   OPERATION:  Subxiphoid pericardial window.  SURGEON:  Kerin Perna, M.D.  ASSISTANT:  Doree Fudge, PA-C  PREOPERATIVE DIAGNOSES:  Large pericardial effusion with symptomatic epigastric pain and nausea.  POSTOPERATIVE DIAGNOSES:  Large pericardial effusion with symptomatic epigastric pain and nausea.  ANESTHESIA:  General.  INDICATIONS:  The patient is a 66 year old Caucasian female, nonsmoker, who presents with recent episodes of severe upper abdominal discomfort and nausea with increasing frequency.  An ultrasound of the abdomen showed no pathologic findings, but there was pericardial fluid noted.  A followup CT scan as well as a 2D echocardiogram performed by her cardiologist, Dr. Anne Fu demonstrated a large circumferential pericardial effusion without tamponade.  Pericardial window and drainage of the fluid was recommended.  Prior to surgery, I examined the patient in the office and reviewed the results of the 2D echo and CT scan.  I discussed the indications and benefits of drainage of the pericardial fluid as well as the details of the surgery including the location of the surgical incision, use of general anesthesia, and the expected postoperative hospital recovery. The risks of the operation including risks to anesthesia, bleeding, infection were explained to the patient.  She understood and agreed to proceed with surgery.  OPERATIVE PROCEDURE:  The patient was brought to the operating room and placed in supine on the operating room table where general anesthesia was induced.  A transesophageal echo probe was placed by the anesthesiologist, which  confirmed the preoperative diagnosis of a large pericardial effusion.  The patient was prepped and draped as a sterile field.  A proper time-out was performed.  A small incision was made, centered on the xiphoid.  The xiphoid was excised.  The fascia was opened and the sternal elevating retractor was placed.  The soft tissue over the anterior pericardium was debrided and the pericardium appeared to be normal.  The right pleura was very thin at this area and an opening in the right pleura was noted.  A 15-blade scalpel was used to make an incision in the pericardium and clear, but somewhat viscous fluid was drained under pressure.  A 500 mL total was drained.  This was sent for cytology and culture.  A 3-cm x 4- cm pericardial section was excised and sent for culture and pathology.  The anterior surface of the heart was examined and the epicardium was normal without evidence of inflammation or edema.  The pericardial tissue itself was thin and delicate without evidence of inflammation or pathology.  A soft Bard catheter was placed in the deep portion of the pericardium and brought out through separate incision.  A small chest tube was placed in the right pleural space and brought out through separate incision.  These were secured to the skin.  The retractors were removed. The fascia was closed with interrupted #1 Vicryl.  The  subcutaneous and skin layers were closed in running Vicryl and the patient had a sterile dressing applied.  The chest tubes were connected to an underwater seal Pleur-evac system.  The patient was extubated and returned to the recovery room in stable condition.     Kerin Perna, M.D.     PV/MEDQ  D:  02/18/2012  T:  02/19/2012  Job:  [id number]  cc:   Jake Bathe, MD

## 2012-02-19 NOTE — Progress Notes (Signed)
Pt transferred to 3314 via wheelchair. Vitals per frequient flowsheet

## 2012-02-19 NOTE — Progress Notes (Signed)
TCTS BRIEF SICU PROGRESS NOTE  1 Day Post-Op  S/P Procedure(s) (LRB): SUBXYPHOID PERICARDIAL WINDOW (N/A)   Stable day  Plan: Continue current plan  OWEN,CLARENCE H 02/19/2012 5:48 PM

## 2012-02-19 NOTE — Progress Notes (Addendum)
   CARDIOTHORACIC SURGERY PROGRESS NOTE  1 Day Post-Op  S/P Procedure(s) (LRB): SUBXYPHOID PERICARDIAL WINDOW (N/A)  Subjective: Feels sore.  Some nausea earlier.  No SOB.  Objective: Vital signs in last 24 hours: Temp:  [96 F (35.6 C)-99 F (37.2 C)] 98.9 F (37.2 C) (12/14 0810) Pulse Rate:  [44-108] 95  (12/14 0900) Cardiac Rhythm:  [-] Sinus tachycardia (12/14 0800) Resp:  [9-28] 19  (12/14 0900) BP: (100-132)/(45-87) 114/55 mmHg (12/14 0900) SpO2:  [92 %-100 %] 93 % (12/14 0900) Arterial Line BP: (76-178)/(27-80) 102/53 mmHg (12/14 0900) Weight:  [51.8 kg (114 lb 3.2 oz)] 51.8 kg (114 lb 3.2 oz) (12/14 0700)  Physical Exam:  Rhythm:   sinus  Breath sounds: clear  Heart sounds:  RRR  Incisions:  Dressings dry  Abdomen:  Soft, non-distended, non-tender  Extremities:  Warm, well-perfused  Chest tube(s):  No air leak, low volume serosanguinous output   Intake/Output from previous day: 12/13 0701 - 12/14 0700 In: 2821.7 [P.O.:60; I.V.:2411.7; IV Piggyback:350] Out: 1760 [Urine:1460; Chest Tube:300] Intake/Output this shift: Total I/O In: 452 [I.V.:300; IV Piggyback:152] Out: 625 [Urine:575; Chest Tube:50]  Lab Results:  Basename 02/19/12 0454 02/18/12 0730  WBC 8.9 6.3  HGB 12.3 11.5*  HCT 36.3 34.6*  PLT 255 293   BMET:  Basename 02/19/12 0454 02/18/12 0730  NA 136 138  K 4.3 4.1  CL 102 102  CO2 25 24  GLUCOSE 177* 222*  BUN 7 14  CREATININE 0.51 0.43*  CALCIUM 9.0 9.6    CBG (last 3)   Basename 02/19/12 0808 02/19/12 0401 02/18/12 2346  GLUCAP 189* 150* 97    CXR:  *RADIOLOGY REPORT*  Clinical Data: Pericardial effusion. Status post pericardial  window and a central line placement.  PORTABLE CHEST - 1 VIEW  Comparison: 02/18/2012  Findings: Two chest tubes are in place. Tiny right apical  pneumothorax. Central venous catheter tip is in the superior vena  cava at the level of the azygos vein.  The heart size and pulmonary vascularity are  normal. No  infiltrates. No effusions.  IMPRESSION:  Tubes and central line good position. Tiny right apical  pneumothorax.  Original Report Authenticated By: Francene Boyers, M.D.   Assessment/Plan: S/P Procedure(s) (LRB): SUBXYPHOID PERICARDIAL WINDOW (N/A)  Doing well POD1 Mobilize D/C a-line D/C right pleural tube Leave pericardial tube 1 more day Restart lantus insulin but hold metformin until po intake improves Transfer step down  Adrian Dinovo H 02/19/2012 10:28 AM

## 2012-02-19 NOTE — Progress Notes (Signed)
   Covering for [facility] cardiology. Consult note by Dr. Mayford Knife reviewed. Patient status post pericardial window with drainage of large pericardial effusion. Heart rhythm has been stable by telemetry - no progressive or recurring heart block. Heart rtae 90-110 in sinus rhythm and SBP 100-130 range. TSH normal at 1.6. Continue observation.  Jonelle Sidle, M.D., F.A.C.C.

## 2012-02-20 ENCOUNTER — Inpatient Hospital Stay (HOSPITAL_COMMUNITY): Payer: Medicare Other

## 2012-02-20 DIAGNOSIS — Z9889 Other specified postprocedural states: Secondary | ICD-10-CM

## 2012-02-20 LAB — CBC
HCT: 34.7 % — ABNORMAL LOW (ref 36.0–46.0)
Hemoglobin: 11.9 g/dL — ABNORMAL LOW (ref 12.0–15.0)
RBC: 3.91 MIL/uL (ref 3.87–5.11)
RDW: 13.2 % (ref 11.5–15.5)
WBC: 7.2 10*3/uL (ref 4.0–10.5)

## 2012-02-20 LAB — GLUCOSE, CAPILLARY
Glucose-Capillary: 205 mg/dL — ABNORMAL HIGH (ref 70–99)
Glucose-Capillary: 191 mg/dL — ABNORMAL HIGH (ref 70–99)
Glucose-Capillary: 253 mg/dL — ABNORMAL HIGH (ref 70–99)
Glucose-Capillary: 198 mg/dL — ABNORMAL HIGH (ref 70–99)

## 2012-02-20 LAB — COMPREHENSIVE METABOLIC PANEL
Albumin: 2.9 g/dL — ABNORMAL LOW (ref 3.5–5.2)
Alkaline Phosphatase: 67 U/L (ref 39–117)
BUN: 7 mg/dL (ref 6–23)
CO2: 26 mEq/L (ref 19–32)
Chloride: 99 mEq/L (ref 96–112)
GFR calc non Af Amer: >90 mL/min (ref >90)
Glucose, Bld: 161 mg/dL — ABNORMAL HIGH (ref 70–99)
Potassium: 4.1 mEq/L (ref 3.5–5.1)
Total Bilirubin: 0.4 mg/dL (ref 0.3–1.2)

## 2012-02-20 MED ORDER — METFORMIN HCL 500 MG PO TABS
500.0000 mg | ORAL_TABLET | Freq: Two times a day (BID) | ORAL | Status: DC
  Administered 2012-02-20: 500 mg via ORAL
  Filled 2012-02-20 (×4): qty 1

## 2012-02-20 MED ORDER — OXYCODONE-ACETAMINOPHEN 5-325 MG PO TABS: 1.0000 | ORAL_TABLET | ORAL | Status: DC | PRN

## 2012-02-20 MED ORDER — SODIUM CHLORIDE 0.9 % IJ SOLN
INTRAMUSCULAR | Status: AC
  Administered 2012-02-20: 04:00:00
  Filled 2012-02-20: qty 10

## 2012-02-20 NOTE — Progress Notes (Addendum)
2 Days Post-Op Procedure(s) (LRB): SUBXYPHOID PERICARDIAL WINDOW (N/A) Subjective:  Audrey [name] complains of fatigue this morning.  She continues to have pain at chest tube site.   Objective: Vital signs in last 24 hours: Temp:  [97.8 F (36.6 C)-99.1 F (37.3 C)] 98.4 F (36.9 C) (12/15 0719) Pulse Rate:  [95-120] 100  (12/15 0720) Cardiac Rhythm:  [-] Sinus tachycardia;Normal sinus rhythm (12/15 0720) Resp:  [15-27] 15  (12/15 0720) BP: (105-143)/(52-81) 135/61 mmHg (12/15 0720) SpO2:  [94 %-100 %] 95 % (12/15 0720) Arterial Line BP: (141-158)/(69-74) 141/69 mmHg (12/14 1100) Weight:  [114 lb 6.7 oz (51.9 kg)] 114 lb 6.7 oz (51.9 kg) (12/14 2038)   Intake/Output from previous day: 12/14 0701 - 12/15 0700 In: 1336.8 [P.O.:530; I.V.:654.8; IV Piggyback:152] Out: 3025 [Urine:2850; Chest Tube:175]  General appearance: alert, cooperative and no distress Heart: regular rate and rhythm Lungs: clear to auscultation bilaterally Abdomen: soft, non-tender; bowel sounds normal; no masses,  no organomegaly Extremities: extremities normal, atraumatic, no cyanosis or edema Wound: clean and dry  Lab Results:  [facility] 02/20/12 0455 02/19/12 0454  WBC 7.2 8.9  HGB 11.9* 12.3  HCT 34.7* 36.3  PLT 173 255   BMET:  Basename 02/20/12 0455 02/19/12 0454  NA 136 136  K 4.1 4.3  CL 99 102  CO2 26 25  GLUCOSE 161* 177*  BUN 7 7  CREATININE 0.52 0.51  CALCIUM 9.2 9.0    PT/INR:  Basename 02/18/12 0730  LABPROT 12.7  INR 0.96   ABG    Component Value Date/Time   PHART 7.388 02/19/2012 0508   HCO3 25.9* 02/19/2012 0508   TCO2 27 02/19/2012 0508   O2SAT 96.0 02/19/2012 0508   CBG (last 3)   Basename 02/20/12 0743 02/19/12 2205 02/19/12 1547  GLUCAP 205* 192* 97    Assessment/Plan: S/P Procedure(s) (LRB): SUBXYPHOID PERICARDIAL WINDOW (N/A)  1. Chest tube in place- 175cc out last 24 hours, may benefit to leave chest tube in one more day 2. DM- sugars uncontrolled, will  restart home Metformin 3. Dispo- chest tube management per Dr. Cornelius Moras   LOS: 2 days    BARRETT, Denny Peon 02/20/2012   I have seen and examined the patient and agree with the assessment and plan as outlined.  Leave pericardial tube 1 more day.  OWEN,CLARENCE H 02/20/2012 10:50 AM

## 2012-02-20 NOTE — Discharge Summary (Addendum)
Physician Discharge Summary  Patient ID: Audrey [name] MRN: [id number] DOB/AGE: 1945/03/18 66 y.o.  Admit date: 02/18/2012 Discharge date: 02/23/2012  Admission Diagnoses:  Patient Active Problem List  Diagnosis  . Diabetes mellitus  . Seasonal allergies  . Osteopenia  . Pericardial effusion  . Second degree AV block, Mobitz type I   Discharge Diagnoses:   Patient Active Problem List  Diagnosis  . Diabetes mellitus  . Seasonal allergies  . Osteopenia  . Pericardial effusion  . Second degree AV block, Mobitz type I  . S/P pericardial surgery   Discharged Condition: good  History of Present Illness:   Audrey [name] is a 66 yo white female no known history of malignancy or coronary disease who presented to her PCP with a complaint of severe episodic nausea.  Ultrasound of the abdomen was obtained and was negative.  There was however some pericardial fluid present.  CT scan of the chest was obtained and confirmed the presence of a large circumferential pericardial effusion.  There was no evidence of pulmonary nodule or mediastinal adenopathy present.  Patient underwent further workup by Dr. Jacklyn Shell who performed an Echocardiogram which again showed pericardial effusion without evidence of tamponade.  The patient was referred to [facility] for further evaluation.  She was evaluated by Dr. Donata Clay on 02/16/2012 at which time it was felt the patient would benefit from a pericardial window.  The risks and benefits of the procedure were explained to the patient and she was agreeable to proceed.    Hospital Course:   Audrey [name] presented to [facility] on 02/18/2012.  She was taken to the operating room and underwent a subxiphoid pericardial window.  Fluid was obtained for culture and cytology and pericardium was sent for pathology.  The patient tolerated the procedure well.  She was extubated and taken to the SICU in stable condition.  The patient has done well post operatively.  Her chest tube and  pericardial drain were removed without difficulty.  Chest xray did not show evidence of significant pleural effusion or pneumothorax.  Tissue and fluid cultures have been negative.  She was tachycardic with heart rates into the 110-120's. She had no further evidence of AV block and was started on low dose Lopressor.  She was placed on NSAIDS for discomfort possibly related to pericarditis.She will need to follow up with Dr. Donata Clay in 2 weeks with a chest xray.  She will also need to follow up with Dr. Anne Fu.      Treatments: surgery: Subxiphoid Pericardial Window  Disposition: Home   Medication List     As of 02/23/2012  9:43 AM    TAKE these medications         CINNAMON PO   Take 1 tablet by mouth daily.      insulin glargine 100 UNIT/ML injection   Commonly known as: LANTUS   Inject 7 Units into the skin at bedtime.      lisinopril 5 MG tablet   Commonly known as: PRINIVIL,ZESTRIL   Take 5 mg by mouth daily.      metFORMIN 1000 MG tablet   Commonly known as: GLUCOPHAGE   Take 1,000 mg by mouth 2 (two) times daily with a meal.      metoprolol tartrate 25 MG tablet   Commonly known as: LOPRESSOR   Take 0.5 tablets (12.5 mg total) by mouth 2 (two) times daily.      multivitamin tablet   Take 1 tablet by mouth daily.  oxyCODONE-acetaminophen 5-325 MG per tablet   Commonly known as: PERCOCET/ROXICET   Take 1-2 tablets by mouth every 4 (four) hours as needed.      pravastatin 40 MG tablet   Commonly known as: PRAVACHOL   Take 40 mg by mouth daily.      traZODone 50 MG tablet   Commonly known as: DESYREL   Take 50 mg by mouth at bedtime as needed. For sleep      VITAMIN C PO   Take 1 tablet by mouth daily.           dlise Signed: Lowella Dandy 02/20/2012, 10:41 AM

## 2012-02-21 ENCOUNTER — Inpatient Hospital Stay (HOSPITAL_COMMUNITY): Payer: Medicare Other

## 2012-02-21 ENCOUNTER — Encounter (HOSPITAL_COMMUNITY): Payer: Self-pay | Admitting: Cardiothoracic Surgery

## 2012-02-21 LAB — TISSUE CULTURE
Culture: NO GROWTH
Gram Stain: NONE SEEN

## 2012-02-21 LAB — GLUCOSE, CAPILLARY
Glucose-Capillary: 223 mg/dL — ABNORMAL HIGH (ref 70–99)
Glucose-Capillary: 224 mg/dL — ABNORMAL HIGH (ref 70–99)
Glucose-Capillary: 111 mg/dL — ABNORMAL HIGH (ref 70–99)
Glucose-Capillary: 222 mg/dL — ABNORMAL HIGH (ref 70–99)

## 2012-02-21 MED ORDER — METFORMIN HCL 500 MG PO TABS
1000.0000 mg | ORAL_TABLET | Freq: Two times a day (BID) | ORAL | Status: DC
  Administered 2012-02-21 – 2012-02-23 (×5): 1000 mg via ORAL
  Filled 2012-02-21 (×7): qty 2

## 2012-02-21 MED ORDER — IBUPROFEN 600 MG PO TABS
600.0000 mg | ORAL_TABLET | Freq: Three times a day (TID) | ORAL | Status: DC
  Administered 2012-02-21 – 2012-02-22 (×5): 600 mg via ORAL
  Filled 2012-02-21 (×10): qty 1

## 2012-02-21 MED ORDER — INSULIN GLARGINE 100 UNIT/ML ~~LOC~~ SOLN
4.0000 [IU] | Freq: Every day | SUBCUTANEOUS | Status: DC
  Administered 2012-02-21: 4 [IU] via SUBCUTANEOUS

## 2012-02-21 MED ORDER — METOPROLOL TARTRATE 12.5 MG HALF TABLET
12.5000 mg | ORAL_TABLET | Freq: Two times a day (BID) | ORAL | Status: DC
  Administered 2012-02-21 – 2012-02-23 (×5): 12.5 mg via ORAL
  Filled 2012-02-21 (×6): qty 1

## 2012-02-21 NOTE — Progress Notes (Addendum)
                   [address]            [place] [address]          [phone]      3 Days Post-Op Procedure(s) (LRB): SUBXYPHOID PERICARDIAL WINDOW (N/A)  Subjective: Patient not feeling all that well this am. Has occasional nausea but no emesis or abdominal pain. Has pain under left breast area (ribs).  Objective: Vital signs in last 24 hours: Temp:  [97.9 F (36.6 C)-98.4 F (36.9 C)] 97.9 F (36.6 C) (12/16 0445) Pulse Rate:  [98-107] 99  (12/16 0445) Cardiac Rhythm:  [-] Normal sinus rhythm (12/16 0445) Resp:  [15-24] 21  (12/16 0445) BP: (101-135)/(56-82) 101/56 mmHg (12/16 0445) SpO2:  [94 %-99 %] 99 % (12/16 0445)   Current Weight  02/19/12 51.9 kg (114 lb 6.7 oz)      Intake/Output from previous day: 12/15 0701 - 12/16 0700 In: 1140 [P.O.:720; I.V.:420] Out: 1745 [Urine:1550; Chest Tube:195]   Physical Exam:  Cardiovascular: Tachycardic Pulmonary: Clear to auscultation on right and diminished at left base; no rales, wheezes, or rhonchi. Abdomen: Soft, non tender, bowel sounds present. Extremities: No lower extremity edema. Wounds: Clean and dry.  No erythema or signs of infection.  Lab Results: CBC: Basename 02/20/12 0455 02/19/12 0454  WBC 7.2 8.9  HGB 11.9* 12.3  HCT 34.7* 36.3  PLT 173 255   BMET:  Basename 02/20/12 0455 02/19/12 0454  NA 136 136  K 4.1 4.3  CL 99 102  CO2 26 25  GLUCOSE 161* 177*  BUN 7 7  CREATININE 0.52 0.51  CALCIUM 9.2 9.0    PT/INR:  Lab Results  Component Value Date   INR 0.96 02/18/2012   ABG:  INR: Will add last result for INR, ABG once components are confirmed Will add last 4 CBG results once components are confirmed  Assessment/Plan:  1. CV - PACs/ST. Will start Lopressor with parameters 2.  Pulmonary - Pericardial tube with 195 cc of output and is on water seal. Leave for now. Encourage incentive spirometer.CXR this am appears to show patient is rotated to the left, no  pneumothorax, atelectasis and small left pleural effusion. 3.DM-CBGs 191/253/198. On Metformin and Insulin. Will increase Metformin to pre op dose for better glucose control   ZIMMERMAN,DONIELLE MPA-C 02/21/2012,7:15 AM patient examined and medical record reviewed,agree with above note.  Leave pericardial drain Start po ibupropen VAN TRIGT III,Kaylene Dawn 02/21/2012

## 2012-02-21 NOTE — Discharge Summary (Signed)
patient examined and medical record reviewed,agree with above note. VAN TRIGT III,Audrey [name] 02/21/2012    

## 2012-02-21 NOTE — Progress Notes (Signed)
Inpatient Diabetes Program Recommendations  AACE/ADA: New Consensus Statement on Inpatient Glycemic Control (2013)  Target Ranges:  Prepandial:   less than 140 mg/dL      Peak postprandial:   less than 180 mg/dL (1-2 hours)      Critically ill patients:  140 - 180 mg/dL   Reason for Visit: Hyperglycemia   Results for [name], [name] (MRN [id number]) as of 02/21/2012 16:16  Ref. Range 02/20/2012 07:43 02/20/2012 11:02 02/20/2012 17:24 02/20/2012 21:26 02/21/2012 08:04 02/21/2012 11:18  Glucose-Capillary Latest Range: 70-99 mg/dL 409 (H) 811 (H) 914 (H) 198 (H) 223 (H) 224 (H)    Note: Received home dose of Lantus 7 units last night.  Currently scheduled to receive Lantus 4 units at HS.  Please consider changing Lantus dose back to at least 7 units at HS.  May even benefit from slight increase to 8 or 9 units.  Thank you.  Tiphanie Vo S. Elsie Lincoln, RN, CNS, CDE Inpatient Diabetes Program, team pager [phone]

## 2012-02-21 NOTE — Progress Notes (Signed)
Utilization review completed.  

## 2012-02-21 NOTE — Progress Notes (Addendum)
Uncomfortable. Worse with the act of getting up out of seated position.   Sinus tachy. Vitals reviewed.  No RUB,Tachy RR  Await cytology  Hopefully when drainage removed, pain will be reduced.  66 year old with large pericardial effusion s/p pericardial window.   ?NSAIDS, such as ibuprofen 600 TID for discomfort, pericardial effusion. Will defer to Dr. Lorrin Mais team.   No further evidence of second degree heart block (likely vagal). OK with metoprolol.   In office, RF and RNP were positive. ANA neg.

## 2012-02-22 ENCOUNTER — Inpatient Hospital Stay (HOSPITAL_COMMUNITY): Payer: Medicare Other

## 2012-02-22 LAB — GLUCOSE, CAPILLARY
Glucose-Capillary: 219 mg/dL — ABNORMAL HIGH (ref 70–99)
Glucose-Capillary: 187 mg/dL — ABNORMAL HIGH (ref 70–99)
Glucose-Capillary: 271 mg/dL — ABNORMAL HIGH (ref 70–99)
Glucose-Capillary: 85 mg/dL (ref 70–99)

## 2012-02-22 LAB — BODY FLUID CULTURE: Culture: NO GROWTH

## 2012-02-22 MED ORDER — INSULIN GLARGINE 100 UNIT/ML ~~LOC~~ SOLN
7.0000 [IU] | Freq: Every day | SUBCUTANEOUS | Status: DC
  Administered 2012-02-22: 7 [IU] via SUBCUTANEOUS

## 2012-02-22 NOTE — Progress Notes (Addendum)
                    [address]            [place] [address]          [phone]     4 Days Post-Op Procedure(s) (LRB): SUBXYPHOID PERICARDIAL WINDOW (N/A)  Subjective: Feels well, less sore today.  No complaints.  Objective: Vital signs in last 24 hours: Patient Vitals for the past 24 hrs:  BP Temp Temp src Pulse Resp SpO2  02/22/12 0803 103/55 mmHg 98 F (36.7 C) Oral - - -  02/22/12 0406 115/60 mmHg 98.1 F (36.7 C) Oral - - -  02/22/12 0034 106/59 mmHg 98.1 F (36.7 C) Oral 85  17  96 %  02/21/12 2038 118/62 mmHg 98.5 F (36.9 C) Oral 90  20  96 %  02/21/12 1539 - 98.2 F (36.8 C) Oral - - 97 %  02/21/12 1120 106/57 mmHg 98.4 F (36.9 C) Oral - - -   Current Weight  02/19/12 114 lb 6.7 oz (51.9 kg)     Intake/Output from previous day: 12/16 0701 - 12/17 0700 In: 240 [P.O.:240] Out: 900 [Urine:875; Chest Tube:25]  CBGs 111-222-219  PHYSICAL EXAM:  Heart: RRR Lungs: Clear Wound: Clean and dry    Lab Results: CBC: Basename 02/20/12 0455  WBC 7.2  HGB 11.9*  HCT 34.7*  PLT 173   BMET:  Basename 02/20/12 0455  NA 136  K 4.1  CL 99  CO2 26  GLUCOSE 161*  BUN 7  CREATININE 0.52  CALCIUM 9.2    PT/INR: No results found for this basename: LABPROT,INR in the last 72 hours  CXR: Findings: The patient has undergone pericardial window. Right  internal jugular center venous catheter remains in appropriate  position. Heart size is stable. There is decreased left basilar  atelectasis since prior exam. Persistent tiny left pleural  effusion noted. Right lung remains clear. No pneumothorax  identified.  IMPRESSION:  1. Decreased left basilar atelectasis.  2. Persistent tiny left pleural effusion. No pneumothorax  identified.    Cytology negative   Assessment/Plan: S/P Procedure(s) (LRB): SUBXYPHOID PERICARDIAL WINDOW (N/A) Chest tube output scant- will d/c CT this am. CBGs trending up.  Continue po meds, and will increase  Lantus to home dose. Hopefully home in am if remains stable.    LOS: 4 days    COLLINS,GINA H 02/22/2012   patient examined and medical record reviewed,agree with above note.  Path of pericardial tissue negative, fluid cytology negative VAN TRIGT III,PETER 02/22/2012

## 2012-02-22 NOTE — Progress Notes (Signed)
Courtesy note  Feels better  Cytology negative  Chest tube to be removed as well as RIJ central line.   Ibuprofen.   Pericardial effusion - window. Reassuring fluid analysis.  Discussed with Dr. Maren Beach.

## 2012-02-23 ENCOUNTER — Inpatient Hospital Stay (HOSPITAL_COMMUNITY): Payer: Medicare Other

## 2012-02-23 LAB — GLUCOSE, CAPILLARY: Glucose-Capillary: 151 mg/dL — ABNORMAL HIGH (ref 70–99)

## 2012-02-23 MED ORDER — METOPROLOL TARTRATE 25 MG PO TABS: 12.5000 mg | ORAL_TABLET | Freq: Two times a day (BID) | ORAL | Status: DC

## 2012-02-23 NOTE — Progress Notes (Addendum)
                   [address]            [place] [address]          [phone]      5 Days Post-Op Procedure(s) (LRB): SUBXYPHOID PERICARDIAL WINDOW (N/A)  Subjective: Patient did not sleep well. Left rib pain is nearly gone now that pericardial tube was removed  Objective: Vital signs in last 24 hours: Temp:  [97.4 F (36.3 C)-99 F (37.2 C)] 97.7 F (36.5 C) (12/18 0743) Pulse Rate:  [73-94] 73  (12/18 0420) Cardiac Rhythm:  [-] Normal sinus rhythm;Sinus tachycardia (12/18 0826) Resp:  [16-19] 18  (12/18 0420) BP: (104-119)/(48-65) 108/51 mmHg (12/18 0743) SpO2:  [96 %-97 %] 96 % (12/18 0420)   Current Weight  02/19/12 51.9 kg (114 lb 6.7 oz)      Intake/Output from previous day: 12/17 0701 - 12/18 0700 In: 240 [P.O.:240] Out: 1150 [Urine:1150]   Physical Exam:  Cardiovascular: RRR Pulmonary: Clear to auscultation bilaterally; no rales, wheezes, or rhonchi. Abdomen: Soft, non tender, bowel sounds present. Extremities: No lower extremity edema. Wounds: Clean and dry.  No erythema or signs of infection.  Lab Results: CBC:No results found for this basename: WBC:2,HGB:2,HCT:2,PLT:2 in the last 72 hours BMET: No results found for this basename: NA:2,K:2,CL:2,CO2:2,GLUCOSE:2,BUN:2,CREATININE:2,CALCIUM:2 in the last 72 hours  PT/INR:  Lab Results  Component Value Date   INR 0.96 02/18/2012   ABG:  INR: Will add last result for INR, ABG once components are confirmed Will add last 4 CBG results once components are confirmed  Assessment/Plan:  1. CV - Has not had second degree heart block in days.Previous PACs/ST. SR.Will start Lopressor with parameters 2.  Pulmonary - Pericardial tube removed yesterday.Encourage incentive spirometer.CXR this am shows patient is rotated to the left, no pneumothorax. 3.DM-CBGs 271/85/151. On Metformin and Insulin.  4.Discharge patient  ZIMMERMAN,DONIELLE MPA-C 02/23/2012,9:30 AM

## 2012-02-23 NOTE — Progress Notes (Signed)
Discharge instructions given-forms signed and copies given. Pt verbalized understanding. PIV removed. No signs of infection at surgical sites. VSS. All meds current to time.

## 2012-02-25 NOTE — Discharge Summary (Signed)
patient examined and medical record reviewed,agree with above note. VAN TRIGT III,PETER 02/25/2012    

## 2012-03-03 ENCOUNTER — Other Ambulatory Visit: Payer: Self-pay | Admitting: *Deleted

## 2012-03-03 DIAGNOSIS — I313 Pericardial effusion (noninflammatory): Secondary | ICD-10-CM

## 2012-03-06 ENCOUNTER — Ambulatory Visit (INDEPENDENT_AMBULATORY_CARE_PROVIDER_SITE_OTHER): Payer: Self-pay | Admitting: Physician Assistant

## 2012-03-06 ENCOUNTER — Ambulatory Visit
Admission: RE | Admit: 2012-03-06 | Discharge: 2012-03-06 | Disposition: A | Discharge status: Home / Self Care | Payer: Medicare Other | Source: Ambulatory Visit | Attending: Cardiothoracic Surgery | Admitting: Cardiothoracic Surgery

## 2012-03-06 VITALS — BP 100/60 | HR 88 | Resp 16 | Ht 63.5 in | Wt 112.0 lb

## 2012-03-06 DIAGNOSIS — I309 Acute pericarditis, unspecified: Secondary | ICD-10-CM

## 2012-03-06 DIAGNOSIS — Z09 Encounter for follow-up examination after completed treatment for conditions other than malignant neoplasm: Secondary | ICD-10-CM

## 2012-03-06 DIAGNOSIS — I313 Pericardial effusion (noninflammatory): Secondary | ICD-10-CM

## 2012-03-06 NOTE — Progress Notes (Signed)
                    [address]            [place] [address]          [phone]     HPI: Patient returns for routine postoperative follow-up having undergone subxiphoid pericardial window on 02/18/2012 by Dr. Donata Clay.  The patient's postoperative course was generally uneventful.  Her fluid cultures and pericardial biopsy were negative for malignancy.  She was tachycardic, and was started on Lopressor.  She otherwise remained stable and was discharged home on 02/23/2012 in good condition.  Since hospital discharge, the patient has done well.  She remains weak and fatigues easily, but her breathing has been stable.  She is only taking ibuprofen prn for pain and has not required any narcotic pain medication.  She has no specific complaints today.    Current Outpatient Prescriptions  Medication Sig Dispense Refill  . Ascorbic Acid (VITAMIN C PO) Take 1 tablet by mouth daily.      Marland Kitchen CINNAMON PO Take 1 tablet by mouth daily.      Marland Kitchen ibuprofen (ADVIL,MOTRIN) 400 MG tablet Take 400 mg by mouth every 6 (six) hours as needed.      . insulin glargine (LANTUS) 100 UNIT/ML injection Inject 7 Units into the skin at bedtime.      Marland Kitchen lisinopril (PRINIVIL,ZESTRIL) 5 MG tablet Take 5 mg by mouth daily.      . metFORMIN (GLUCOPHAGE) 1000 MG tablet Take 1,000 mg by mouth 2 (two) times daily with a meal.      . metoprolol tartrate (LOPRESSOR) 25 MG tablet Take 0.5 tablets (12.5 mg total) by mouth 2 (two) times daily.  30 tablet  0  . Multiple Vitamin (MULTIVITAMIN) tablet Take 1 tablet by mouth daily.      . pravastatin (PRAVACHOL) 40 MG tablet Take 40 mg by mouth daily.      Marland Kitchen oxyCODONE-acetaminophen (PERCOCET/ROXICET) 5-325 MG per tablet Take 1-2 tablets by mouth every 4 (four) hours as needed.  30 tablet  0  . traZODone (DESYREL) 50 MG tablet Take 50 mg by mouth at bedtime as needed. For sleep         Physical Exam: BP 100/60 HR 88 Resp 16 Wounds: Clean and dry Heart: regular  rate and rhythm Lungs: Clear    Diagnostic Tests: Chest xray: Dg Chest 2 View  03/06/2012  *RADIOLOGY REPORT*  Clinical Data: History of pericardial effusion.  Follow-up after surgery 02/18/2012.  CHEST - 2 VIEW  Comparison: 02/23/2012 radiographs.  Findings: The heart size and mediastinal contours are stable without evidence of recurrent pericardial effusion.  Left pleural effusion and left lower lobe atelectasis are unchanged.  The right lung is clear.  There is no pneumothorax.  IMPRESSION: Stable examination with small left pleural effusion and left basilar atelectasis.   Original Report Authenticated By: Carey Bullocks, M.D.        Assessment/Plan: The patient has done well, status post pericardial window.  She has an appointment with Dr. Anne Fu next week.  She has no issues related to her surgery.  We will see her back as needed.  She may resume her regular activities at this point, and may return to work from our standpoint.

## 2012-03-20 ENCOUNTER — Other Ambulatory Visit: Payer: Self-pay | Admitting: Physician Assistant

## 2012-03-23 ENCOUNTER — Other Ambulatory Visit: Payer: Self-pay | Admitting: Physician Assistant

## 2013-05-31 ENCOUNTER — Encounter (HOSPITAL_COMMUNITY): Payer: Self-pay | Admitting: Dietician

## 2013-05-31 NOTE — Progress Notes (Signed)
[facility] Diabetes Class Completion  Date:May 31, 2013  Time: 1730  Pt attended [facility]'s Diabetes Group Education Class on May 31, 2013.   Patient was educated on the following topics:   -Survival skills (signs and symptoms of hyperglycemia and hypoglycemia, treatment for hypoglycemia, ideal levels for fasting and postprandial blood sugars, goal Hgb A1c level, foot care basics)  -Recommendations for physical activity   -Carbohydrate metabolism in relation to diabetes   -Meal planning (sources of carbohydrate, carbohydrate counting, meal planning strategies, food label reading, and portion control).  Handouts provided:  -"Diabetes and You: Taking Charge of Your Health"  -"Carbohydrate Counting and Meal Planning"  -"Your Guide to Better Office Visits"   Sherod Cisse A. Kerra Guilfoil, RD, LDN  

## 2013-09-17 ENCOUNTER — Encounter: Payer: Self-pay | Admitting: Cardiology

## 2013-10-24 ENCOUNTER — Ambulatory Visit: Payer: Medicare Other | Admitting: Cardiology

## 2013-11-01 ENCOUNTER — Ambulatory Visit: Payer: Medicare Other | Admitting: Cardiology

## 2013-11-13 ENCOUNTER — Telehealth: Payer: Self-pay | Admitting: Cardiology

## 2013-11-13 NOTE — Telephone Encounter (Signed)
Calling stating yesterday she felt like she was going to pass out, very weak, (L) arm hurting around elbow, heart fluttering.  She is wondering if she might have fluid build up around heart again.  States she is having some SOB when she walks. States her  BS was high yesterday at 4. Hasn't taken BP. States she usually doesn't have problem with BP.  Is taking Lisinopril 2.5 mg daily. Is not taking the Metoprolol. Today she states she just feels "washed out" and is nauseated. States she hasn't felt good over the past several weeks. Has an app w/Dr. Marlou Porch on 9/30 and she is scheduled  to see Dr. Loanne Drilling on 9/10. Advised would forward message to Dr. Marlou Porch for his recommendations.

## 2013-11-13 NOTE — Telephone Encounter (Signed)
New problem:    Per pt she has not been feeling well for the pass 2 days.  Yesterday heart fluttering, very tiered, some Nausea and thinks she needs to be seen.

## 2013-11-14 NOTE — Telephone Encounter (Signed)
See if she can come in today at 9:15am. Candee Furbish, MD

## 2013-11-14 NOTE — Telephone Encounter (Signed)
Left message for pt at home number to call back.  Was able to contact her at her work number.  She is not able to come into the office today for eval but has been double booked for tomorrow at 3:15 pm  She is aware is will probably have to wait to be seen since the schedule is already double booked.  She is not having problems at this time.

## 2013-11-15 ENCOUNTER — Encounter: Payer: Self-pay | Admitting: Endocrinology

## 2013-11-15 ENCOUNTER — Encounter: Payer: Self-pay | Admitting: Cardiology

## 2013-11-15 ENCOUNTER — Ambulatory Visit (INDEPENDENT_AMBULATORY_CARE_PROVIDER_SITE_OTHER): Payer: Medicare Other | Admitting: Cardiology

## 2013-11-15 ENCOUNTER — Ambulatory Visit (INDEPENDENT_AMBULATORY_CARE_PROVIDER_SITE_OTHER): Payer: Medicare Other | Admitting: Endocrinology

## 2013-11-15 VITALS — BP 112/74 | HR 95 | Ht 63.5 in | Wt 115.0 lb

## 2013-11-15 VITALS — BP 126/84 | HR 97 | Temp 98.0°F | Ht 63.5 in | Wt 114.0 lb

## 2013-11-15 DIAGNOSIS — E1165 Type 2 diabetes mellitus with hyperglycemia: Secondary | ICD-10-CM

## 2013-11-15 DIAGNOSIS — I313 Pericardial effusion (noninflammatory): Secondary | ICD-10-CM

## 2013-11-15 DIAGNOSIS — E119 Type 2 diabetes mellitus without complications: Secondary | ICD-10-CM

## 2013-11-15 DIAGNOSIS — M25529 Pain in unspecified elbow: Secondary | ICD-10-CM

## 2013-11-15 DIAGNOSIS — I3139 Other pericardial effusion (noninflammatory): Secondary | ICD-10-CM

## 2013-11-15 DIAGNOSIS — I319 Disease of pericardium, unspecified: Secondary | ICD-10-CM

## 2013-11-15 DIAGNOSIS — M25522 Pain in left elbow: Secondary | ICD-10-CM | POA: Insufficient documentation

## 2013-11-15 NOTE — Progress Notes (Signed)
[facility]. [address], [address] [place], [place]  [address] Phone: [phone] Fax:  [phone]  Date:  11/15/2013   ID:  Audrey [name], Audrey [name] 1945-06-30, MRN [id number]  PCP:  Mayra Neer, MD   History of Present Illness: Audrey [name] is a 67 y.o. female been feeling washed out for past few weeks. Sugar has been erratic. She's concerned that she may have effusion again, pericardial effusion. She had a window in the past. Her blood pressure has been running fairly low. She is on low-dose lisinopril. She felt an occasional heart flutter. She was worried that left elbow pain may be angina. She did pick up her nephew however. She denies any recent fevers, nausea, vomiting. She does have some generalized anxiety.   Wt Readings from Last 3 Encounters:  11/15/13 115 lb (52.164 kg)  11/15/13 114 lb (51.71 kg)  03/06/12 112 lb (50.803 kg)     Past Medical History  Diagnosis Date  . Diabetes mellitus   . Hyperlipidemia   . Seasonal allergies   . Osteopenia   . PONV (postoperative nausea and vomiting)   . Pericardial effusion     Past Surgical History  Procedure Laterality Date  . Right wrist fx    . Rectal polyps removed    . Subxyphoid pericardial window  02/18/2012    Procedure: SUBXYPHOID PERICARDIAL WINDOW;  Surgeon: Ivin Poot, MD;  Location: [facility] OR;  Service: Thoracic;  Laterality: N/A;  TEE    Current Outpatient Prescriptions  Medication Sig Dispense Refill  . aspirin 81 MG tablet Take 81 mg by mouth daily.      . Calcium Carb-Cholecalciferol (CALTRATE 600+D) 600-800 MG-UNIT TABS Take by mouth.      . diphenhydrAMINE (BENADRYL) 25 mg capsule Take 25 mg by mouth every 6 (six) hours as needed.      . insulin glargine (LANTUS) 100 UNIT/ML injection Inject 6 Units into the skin at bedtime.       . Insulin Lispro, Human, (HUMALOG KWIKPEN [place]) Inject 3 Units into the skin 3 (three) times daily with meals.       Marland Kitchen lisinopril (PRINIVIL,ZESTRIL) 5 MG tablet Take 2.5 mg  by mouth daily.       . pravastatin (PRAVACHOL) 40 MG tablet Take 40 mg by mouth daily.       No current facility-administered medications for this visit.    Allergies:    Allergies  Allergen Reactions  . Januvia [Sitagliptin]     Social History:  The patient  reports that she has never smoked. She does not have any smokeless tobacco history on file. She reports that she does not drink alcohol or use illicit drugs.   Family History  Problem Relation Age of Onset  . Heart attack Father   . Diabetes Brother   . Heart disease Brother   . Cancer Sister     ovarian    ROS:  Please see the history of present illness.   Generalized malaise, left elbow pain, weakness, fatigue.   All other systems reviewed and negative.   PHYSICAL EXAM: VS:  BP 112/74  Pulse 95  Ht 5' 3.5" (1.613 m)  Wt 115 lb (52.164 kg)  BMI 20.05 kg/m2 Thin, in no acute distress HEENT: normal, /AT, EOMI Neck: no JVD, normal carotid upstroke, no bruit Cardiac:  normal S1, S2; RRR; no murmurno RUB Lungs:  clear to auscultation bilaterally, no wheezing, rhonchi or rales Abd: soft, nontender, no hepatomegaly, no  bruits Ext: no edema, 2+ distal pulses Skin: warm and dry GU: deferred Neuro: no focal abnormalities noted, AAO x 3, anxious  EKG:  11/15/13-sinus rhythm, 95, vertical axis, no other ST segment changes. Right atrial enlargement noted.    ASSESSMENT AND PLAN:  1. Generalized weakness-with her prior pericardial effusion, window, I will check a echocardiogram to ensure proper structure and function and no return of significant effusion. I do not think that her left arm/elbow pain is associated 2 angina. It may be from lifting her nephew, tennis elbow. Also, her blood pressure usually runs on the low side and at home she felt quite weak as though she was going to pass out. Because of this, I will stop her lisinopril. Gatorade, liberalize salt. 2. Diabetes-he is had trouble recently controlling her blood  sugars. Because of her symptom of weakness/near syncope, I will stop her low-dose lisinopril. At some point in the future, we may wish to restart this from a renal protective situation. 3. We'll see her back in one month.  Signed, Candee Furbish, MD [facility]  11/15/2013 3:20 PM

## 2013-11-15 NOTE — Patient Instructions (Addendum)
good diet and exercise habits significanly improve the control of your diabetes.  please let me know if you wish to be referred to a dietician.  high blood sugar is very risky to your health.  you should see an eye doctor and dentist every year.  You are at higher than average risk for pneumonia and hepatitis-B.  You should be vaccinated against both.   controlling your blood pressure and cholesterol drastically reduces the damage diabetes does to your body.  this also applies to quitting smoking.  please discuss these with your doctor.  check your blood sugar twice a day.  vary the time of day when you check, between before the 3 meals, and at bedtime.  also check if you have symptoms of your blood sugar being too high or too low.  please keep a record of the readings and bring it to your next appointment here.  You can write it on any piece of paper.  please call us sooner if your blood sugar goes below 70, or if you have a lot of readings over 200.  Please reduce the lantus to 6 units at bedtime, and: Increase the humalog to 3 units 3 times a day (just before each meal). Please come back for a follow-up appointment in 2 weeks.

## 2013-11-15 NOTE — Progress Notes (Signed)
Subjective:    Patient ID: Audrey [name], female    DOB: February 18, 1946, 68 y.o.   MRN: [id number]  HPI pt states DM was dx'ed in 06-07-2005, when she presented with severe hyperglycemia but not DKA; she has mild if any neuropathy of the lower extremities; he is unaware of any associated chronic complications; he has been on insulin since 07-Jun-2009; pt says her diet and exercise are good; she has never had GDM, pancreatitis, severe hypoglycemia or DKA.   She says cbg's vary widely.  She has hypoglycemia twice a week, usually before breakfast.   Past Medical History  Diagnosis Date  . Diabetes mellitus   . Hyperlipidemia   . Seasonal allergies   . Osteopenia   . PONV (postoperative nausea and vomiting)   . Pericardial effusion     Past Surgical History  Procedure Laterality Date  . Right wrist fx    . Rectal polyps removed    . Subxyphoid pericardial window  02/18/2012    Procedure: SUBXYPHOID PERICARDIAL WINDOW;  Surgeon: Ivin Poot, MD;  Location: [facility];  Service: Thoracic;  Laterality: N/A;  TEE    History   Social History  . Marital Status: Widowed    Spouse Name: died 06/08/11    Number of Children: 1  . Years of Education: N/A   Occupational History  . computer/ desk top    Social History Main Topics  . Smoking status: Never Smoker   . Smokeless tobacco: Not on file  . Alcohol Use: No  . Drug Use: No  . Sexual Activity: Not on file   Other Topics Concern  . Not on file   Social History Narrative  . No narrative on file    Current Outpatient Prescriptions on File Prior to Visit  Medication Sig Dispense Refill  . aspirin 81 MG tablet Take 81 mg by mouth daily.      . insulin glargine (LANTUS) 100 UNIT/ML injection Inject 6 Units into the skin at bedtime.       . pravastatin (PRAVACHOL) 40 MG tablet Take 40 mg by mouth daily.       No current facility-administered medications on file prior to visit.    Allergies  Allergen Reactions  . Januvia [Sitagliptin]      Family History  Problem Relation Age of Onset  . Heart attack Father   . Diabetes Brother   . Heart disease Brother   . Cancer Sister     ovarian   BP 126/84  Pulse 97  Temp(Src) 98 F (36.7 C) (Oral)  Ht 5' 3.5" (1.613 m)  Wt 114 lb (51.71 kg)  BMI 19.87 kg/m2  SpO2 98%  Review of Systems denies weight loss, fever, blurry vision, headache, chest pain, sob, n/v, urinary frequency, muscle cramps, excessive diaphoresis, cold intolerance, rhinorrhea, and easy bruising.  She has depression (she says this is compromising her ability to care fore her DM).      Objective:   Physical Exam VS: see vs page GEN: no distress HEAD: head: no deformity eyes: no periorbital swelling, no proptosis external nose and ears are normal mouth: no lesion seen NECK: supple, thyroid is not enlarged CHEST WALL: no deformity LUNGS:  Clear to auscultation CV: reg rate and rhythm, no murmur ABD: abdomen is soft, nontender.  no hepatosplenomegaly.  not distended.  no hernia MUSCULOSKELETAL: muscle bulk and strength are grossly normal.  no obvious joint swelling.  gait is normal and steady EXTEMITIES: no deformity.  no  ulcer on the feet.  feet are of normal color and temp.  no edema PULSES: dorsalis pedis intact bilat.  no carotid bruit NEURO:  cn 2-12 grossly intact.   readily moves all 4's.  sensation is intact to touch on the feet SKIN:  Normal texture and temperature.  No rash or suspicious lesion is visible.   NODES:  None palpable at the neck PSYCH: alert, well-oriented.  Does not appear anxious nor depressed.   outside test results are reviewed: A1c=8.9.  i have reviewed the following outside records: Office notes  i reviewed electrocardiogram: 02/18/12    Assessment & Plan:  DM: moderate exacerbation Depression, new to me.  This complicates the rx of her DM: she is advised to work with her drs to optimize rx of this. Lean body habitus: pt is advised she is likely evolving type 1  DM, and will need lifelong insulin rx.   Side-effect of rx: hypoglycemia.  Patient is advised the following: Patient Instructions  good diet and exercise habits significanly improve the control of your diabetes.  please let me know if you wish to be referred to a dietician.  high blood sugar is very risky to your health.  you should see an eye doctor and dentist every year.  You are at higher than average risk for pneumonia and hepatitis-B.  You should be vaccinated against both.   controlling your blood pressure and cholesterol drastically reduces the damage diabetes does to your body.  this also applies to quitting smoking.  please discuss these with your doctor.  check your blood sugar twice a day.  vary the time of day when you check, between before the 3 meals, and at bedtime.  also check if you have symptoms of your blood sugar being too high or too low.  please keep a record of the readings and bring it to your next appointment here.  You can write it on any piece of paper.  please call us sooner if your blood sugar goes below 70, or if you have a lot of readings over 200.  Please reduce the lantus to 6 units at bedtime, and: Increase the humalog to 3 units 3 times a day (just before each meal). Please come back for a follow-up appointment in 2 weeks.

## 2013-11-15 NOTE — Patient Instructions (Signed)
Please stop your Lisinopril.  Continue all other medications as listed.  Please increase your fluid and sodium intake.  Your physician has requested that you have an echocardiogram. Echocardiography is a painless test that uses sound waves to create images of your heart. It provides your doctor with information about the size and shape of your heart and how well your heart's chambers and valves are working. This procedure takes approximately one hour. There are no restrictions for this procedure.  Follow up in 1 month with Dr Marlou Porch.

## 2013-11-16 ENCOUNTER — Telehealth: Payer: Self-pay | Admitting: Endocrinology

## 2013-11-16 NOTE — Telephone Encounter (Signed)
Patient called with a question  She was wondering if she still needs to wait 15 minutes after injection before she can eat?  Please advise   Thank you

## 2013-11-16 NOTE — Telephone Encounter (Signed)
Lvom advising to continue waiting 15 minutes after the injection to eat. Requested call back if pt had any questions.

## 2013-12-05 ENCOUNTER — Ambulatory Visit: Payer: Medicare Other | Admitting: Cardiology

## 2013-12-05 ENCOUNTER — Ambulatory Visit: Payer: Medicare Other | Admitting: Internal Medicine

## 2013-12-05 ENCOUNTER — Ambulatory Visit (HOSPITAL_COMMUNITY): Payer: Medicare Other | Attending: Cardiovascular Disease

## 2013-12-05 ENCOUNTER — Ambulatory Visit (INDEPENDENT_AMBULATORY_CARE_PROVIDER_SITE_OTHER): Payer: Medicare Other | Admitting: Endocrinology

## 2013-12-05 ENCOUNTER — Ambulatory Visit: Payer: Medicare Other | Admitting: Endocrinology

## 2013-12-05 ENCOUNTER — Encounter: Payer: Self-pay | Admitting: Endocrinology

## 2013-12-05 VITALS — BP 132/74 | HR 90 | Temp 98.3°F | Ht 63.5 in | Wt 116.0 lb

## 2013-12-05 DIAGNOSIS — I319 Disease of pericardium, unspecified: Secondary | ICD-10-CM | POA: Insufficient documentation

## 2013-12-05 DIAGNOSIS — I313 Pericardial effusion (noninflammatory): Secondary | ICD-10-CM

## 2013-12-05 DIAGNOSIS — E1165 Type 2 diabetes mellitus with hyperglycemia: Secondary | ICD-10-CM

## 2013-12-05 DIAGNOSIS — E785 Hyperlipidemia, unspecified: Secondary | ICD-10-CM | POA: Diagnosis not present

## 2013-12-05 DIAGNOSIS — E119 Type 2 diabetes mellitus without complications: Secondary | ICD-10-CM | POA: Diagnosis not present

## 2013-12-05 DIAGNOSIS — I3139 Other pericardial effusion (noninflammatory): Secondary | ICD-10-CM

## 2013-12-05 NOTE — Progress Notes (Signed)
2D Echo completed. 12/05/2013

## 2013-12-05 NOTE — Progress Notes (Signed)
Subjective:    Patient ID: Audrey [name], female    DOB: 05/10/1945, 68 y.o.   MRN: [id number]  HPI Pt returns for f/u of diabetes mellitus: DM type: type 1 is dx'ed, based on lean body habitus and insulin sensitivity.   Dx'ed: 3614 Complications: none Therapy: insulin since 06/11/09 GDM: never DKA: never Severe hypoglycemia: never Pancreatitis: never Other: she takes multiple daily injections Interval history:  she brings a record of her cbg's which i have reviewed today.  It varies from 79-300, but most are in the 100's.  It is highest at hs, and lowest in am, but not necessarily so.   Past Medical History  Diagnosis Date  . Diabetes mellitus   . Hyperlipidemia   . Seasonal allergies   . Osteopenia   . PONV (postoperative nausea and vomiting)   . Pericardial effusion     Past Surgical History  Procedure Laterality Date  . Right wrist fx    . Rectal polyps removed    . Subxyphoid pericardial window  02/18/2012    Procedure: SUBXYPHOID PERICARDIAL WINDOW;  Surgeon: Ivin Poot, MD;  Location: [facility];  Service: Thoracic;  Laterality: N/A;  TEE    History   Social History  . Marital Status: Widowed    Spouse Name: died 12-Jun-2011    Number of Children: 1  . Years of Education: N/A   Occupational History  . computer/ desk top    Social History Main Topics  . Smoking status: Never Smoker   . Smokeless tobacco: Not on file  . Alcohol Use: No  . Drug Use: No  . Sexual Activity: Not on file   Other Topics Concern  . Not on file   Social History Narrative  . No narrative on file    Current Outpatient Prescriptions on File Prior to Visit  Medication Sig Dispense Refill  . aspirin 81 MG tablet Take 81 mg by mouth daily.      . Calcium Carb-Cholecalciferol (CALTRATE 600+D) 600-800 MG-UNIT TABS Take by mouth.      . diphenhydrAMINE (BENADRYL) 25 mg capsule Take 25 mg by mouth every 6 (six) hours as needed.      . insulin glargine (LANTUS) 100 UNIT/ML injection Inject 5  Units into the skin at bedtime.       . Insulin Lispro, Human, (HUMALOG KWIKPEN [place]) Inject 3 Units into the skin 3 (three) times daily with meals. 3 times a day (just before each meal) 3-3-4 units      . pravastatin (PRAVACHOL) 40 MG tablet Take 40 mg by mouth daily.       No current facility-administered medications on file prior to visit.    Allergies  Allergen Reactions  . Januvia [Sitagliptin]     Family History  Problem Relation Age of Onset  . Heart attack Father   . Diabetes Brother   . Heart disease Brother   . Cancer Sister     ovarian    BP 132/74  Pulse 90  Temp(Src) 98.3 F (36.8 C) (Oral)  Ht 5' 3.5" (1.613 m)  Wt 116 lb (52.617 kg)  BMI 20.22 kg/m2  SpO2 97%  Review of Systems She denies hypoglycemia and weight change.      Objective:   Physical Exam VITAL SIGNS:  See vs page GENERAL: no distress SKIN:  Insulin injection sites at the anterior abdomen are normal Pulses: dorsalis pedis intact bilat.   Feet: no deformity.  no edema. Skin:  no ulcer  on the feet.  normal color and temp.  Neuro: sensation is intact to touch on the feet.       Assessment & Plan:  DM: mild exacerbation: Based on the pattern of her cbg's, she needs some adjustment in her therapy.     Patient is advised the following: Patient Instructions  check your blood sugar twice a day.  vary the time of day when you check, between before the 3 meals, and at bedtime.  also check if you have symptoms of your blood sugar being too high or too low.  please keep a record of the readings and bring it to your next appointment here.  You can write it on any piece of paper.  please call us sooner if your blood sugar goes below 70, or if you have a lot of readings over 200.  Please reduce the lantus to 5 units at bedtime, and: Increase the humalog to 3 times a day (just before each meal) 3-3-4 units Please come back for a follow-up appointment in 6 weeks.

## 2013-12-05 NOTE — Patient Instructions (Addendum)
check your blood sugar twice a day.  vary the time of day when you check, between before the 3 meals, and at bedtime.  also check if you have symptoms of your blood sugar being too high or too low.  please keep a record of the readings and bring it to your next appointment here.  You can write it on any piece of paper.  please call us sooner if your blood sugar goes below 70, or if you have a lot of readings over 200.  Please reduce the lantus to 5 units at bedtime, and: Increase the humalog to 3 times a day (just before each meal) 3-3-4 units Please come back for a follow-up appointment in 6 weeks.

## 2013-12-10 ENCOUNTER — Telehealth: Payer: Self-pay | Admitting: Cardiology

## 2013-12-10 NOTE — Telephone Encounter (Signed)
Reviewed results of echo with pt who states understanding.

## 2013-12-10 NOTE — Telephone Encounter (Signed)
New problem:    Pt needs a call back at work till 2:45 pm home after that for her Echo results.

## 2014-01-16 ENCOUNTER — Ambulatory Visit (INDEPENDENT_AMBULATORY_CARE_PROVIDER_SITE_OTHER): Payer: Medicare Other | Admitting: Endocrinology

## 2014-01-16 ENCOUNTER — Ambulatory Visit: Payer: Medicare Other | Admitting: Endocrinology

## 2014-01-16 ENCOUNTER — Encounter: Payer: Self-pay | Admitting: Endocrinology

## 2014-01-16 VITALS — BP 122/80 | HR 90 | Temp 98.2°F | Ht 63.5 in | Wt 116.0 lb

## 2014-01-16 DIAGNOSIS — E109 Type 1 diabetes mellitus without complications: Secondary | ICD-10-CM

## 2014-01-16 LAB — TSH: TSH: 0.95 u[IU]/mL (ref 0.35–4.50)

## 2014-01-16 LAB — BASIC METABOLIC PANEL
BUN: 21 mg/dL (ref 6–23)
CHLORIDE: 105 meq/L (ref 96–112)
CO2: 22 meq/L (ref 19–32)
CREATININE: 0.8 mg/dL (ref 0.4–1.2)
Calcium: 9.9 mg/dL (ref 8.4–10.5)
GFR: 73.5 mL/min (ref >60.00)
GLUCOSE: 258 mg/dL — AB (ref 70–99)
Potassium: 4.1 mEq/L (ref 3.5–5.1)
Sodium: 137 mEq/L (ref 135–145)

## 2014-01-16 LAB — HEMOGLOBIN A1C: HEMOGLOBIN A1C: 9.5 % — AB (ref 4.6–6.5)

## 2014-01-16 LAB — LIPID PANEL
CHOL/HDL RATIO: 3
Cholesterol: 135 mg/dL (ref 0–200)
HDL: 44 mg/dL (ref >39.00)
LDL Cholesterol: 77 mg/dL (ref 0–99)
NonHDL: 91
TRIGLYCERIDES: 68 mg/dL (ref 0.0–149.0)
VLDL: 13.6 mg/dL (ref 0.0–40.0)

## 2014-01-16 LAB — MICROALBUMIN / CREATININE URINE RATIO
CREATININE, U: 33.7 mg/dL
Microalb Creat Ratio: 2.1 mg/g (ref 0.0–30.0)
Microalb, Ur: 0.7 mg/dL (ref 0.0–1.9)

## 2014-01-16 NOTE — Progress Notes (Signed)
Subjective:    Patient ID: Audrey [name], female    DOB: Jul 04, 1945, 68 y.o.   MRN: [id number]  HPI Pt returns for f/u of diabetes mellitus: DM type: type 1 is dx'ed, based on lean body habitus and insulin sensitivity.   Dx'ed: 2706 Complications: none Therapy: insulin since 2009-06-20 GDM: never DKA: never Severe hypoglycemia: never Pancreatitis: never Other: she takes multiple daily injections Interval history: she brings a record of her cbg's which i have reviewed today.  It varies from 92-300, but most are in the high-100's.  It is lowest in the afternoon, and highest at hs, and in am.  pt states she feels well in general.   Past Medical History  Diagnosis Date  . Diabetes mellitus   . Hyperlipidemia   . Seasonal allergies   . Osteopenia   . PONV (postoperative nausea and vomiting)   . Pericardial effusion     Past Surgical History  Procedure Laterality Date  . Right wrist fx    . Rectal polyps removed    . Subxyphoid pericardial window  02/18/2012    Procedure: SUBXYPHOID PERICARDIAL WINDOW;  Surgeon: Ivin Poot, MD;  Location: [facility];  Service: Thoracic;  Laterality: N/A;  TEE    History   Social History  . Marital Status: Widowed    Spouse Name: died 2011-06-21    Number of Children: 1  . Years of Education: N/A   Occupational History  . computer/ desk top    Social History Main Topics  . Smoking status: Never Smoker   . Smokeless tobacco: Not on file  . Alcohol Use: No  . Drug Use: No  . Sexual Activity: Not on file   Other Topics Concern  . Not on file   Social History Narrative    Current Outpatient Prescriptions on File Prior to Visit  Medication Sig Dispense Refill  . aspirin 81 MG tablet Take 81 mg by mouth daily.    . Calcium Carb-Cholecalciferol (CALTRATE 600+D) 600-800 MG-UNIT TABS Take by mouth.    . diphenhydrAMINE (BENADRYL) 25 mg capsule Take 25 mg by mouth every 6 (six) hours as needed.    . insulin glargine (LANTUS) 100 UNIT/ML injection  Inject 5 Units into the skin at bedtime.     . Insulin Lispro, Human, (HUMALOG KWIKPEN [place]) Inject 3 Units into the skin 3 (three) times daily with meals. 3 times a day (just before each meal) 3-2-7 units    . pravastatin (PRAVACHOL) 40 MG tablet Take 40 mg by mouth daily.     No current facility-administered medications on file prior to visit.    Allergies  Allergen Reactions  . Januvia [Sitagliptin]     Family History  Problem Relation Age of Onset  . Heart attack Father   . Diabetes Brother   . Heart disease Brother   . Cancer Sister     ovarian    BP 122/80 mmHg  Pulse 90  Temp(Src) 98.2 F (36.8 C) (Oral)  Ht 5' 3.5" (1.613 m)  Wt 116 lb (52.617 kg)  BMI 20.22 kg/m2  SpO2 97%  Review of Systems She denies hypoglycemia and weight change.      Objective:   Physical Exam VITAL SIGNS:  See vs page GENERAL: no distress Pulses: dorsalis pedis intact bilat.   Feet: no deformity.  no edema Skin:  normal temp. Neuro: sensation is intact to touch on the feet  Lab Results  Component Value Date   HGBA1C 9.5* 01/16/2014  Assessment & Plan:  DM: moderate exacerbation: i discussed with Audrey [name], the possibility of pump rx.  They discussed, and pt declines for now.     Patient is advised the following: Patient Instructions  check your blood sugar twice a day.  vary the time of day when you check, between before the 3 meals, and at bedtime.  also check if you have symptoms of your blood sugar being too high or too low.  please keep a record of the readings and bring it to your next appointment here.  You can write it on any piece of paper.  please call us sooner if your blood sugar goes below 70, or if you have a lot of readings over 200.  Please continue the lantus, 5 units at bedtime, and: change the humalog to 3 times a day (just before each meal) 3-2-7 units. Blood and urine tests are being requested for you today.  We'll contact you with results. Please come back for  a follow-up appointment in 3 months.

## 2014-01-16 NOTE — Patient Instructions (Addendum)
check your blood sugar twice a day.  vary the time of day when you check, between before the 3 meals, and at bedtime.  also check if you have symptoms of your blood sugar being too high or too low.  please keep a record of the readings and bring it to your next appointment here.  You can write it on any piece of paper.  please call us sooner if your blood sugar goes below 70, or if you have a lot of readings over 200.  Please continue the lantus, 5 units at bedtime, and: change the humalog to 3 times a day (just before each meal) 3-2-7 units. Blood and urine tests are being requested for you today.  We'll contact you with results. Please come back for a follow-up appointment in 3 months.

## 2014-01-17 ENCOUNTER — Telehealth: Payer: Self-pay | Admitting: Endocrinology

## 2014-01-17 NOTE — Telephone Encounter (Signed)
Patient stated that Dr Audrey [name] changed  Humalog 7 units, at dinner her b/s dropped to 33, she thinks 7 units is to much for her.  Please advise

## 2014-01-17 NOTE — Telephone Encounter (Addendum)
See below and please advise, Thanks!  

## 2014-01-17 NOTE — Addendum Note (Signed)
Addended by: Renato Shin on: 01/17/2014 04:43 PM   Modules accepted: Level of Service

## 2014-01-17 NOTE — Telephone Encounter (Signed)
Requested call back to discuss.  

## 2014-01-17 NOTE — Telephone Encounter (Signed)
Ok, please reduce supper humalog to 6 units

## 2014-01-18 NOTE — Telephone Encounter (Signed)
Pt advised of not below and voiced understanding.

## 2014-02-18 ENCOUNTER — Ambulatory Visit (INDEPENDENT_AMBULATORY_CARE_PROVIDER_SITE_OTHER): Payer: Medicare Other | Admitting: Endocrinology

## 2014-02-18 ENCOUNTER — Encounter: Payer: Self-pay | Admitting: Endocrinology

## 2014-02-18 VITALS — BP 126/74 | HR 94 | Temp 98.3°F | Ht 63.5 in | Wt 115.0 lb

## 2014-02-18 DIAGNOSIS — E109 Type 1 diabetes mellitus without complications: Secondary | ICD-10-CM

## 2014-02-18 NOTE — Progress Notes (Signed)
Subjective:    Patient ID: Audrey [name], female    DOB: [birth year], 68 y.o.   MRN: [id number]  HPI Pt returns for f/u of diabetes mellitus:  DM type: type 1 is dx'ed, based on lean body habitus and insulin sensitivity.   Dx'ed: 1093 Complications: none. Therapy: insulin since 09-Jun-2009 GDM: never DKA: never Severe hypoglycemia: never. Pancreatitis: never Other: she takes multiple daily injections; she declines insulin pump. Interval history: no cbg record, but states cbg's vary from 53 (at HS) to 200 (am).  pt states she feels well in general.  Past Medical History  Diagnosis Date  . Diabetes mellitus   . Hyperlipidemia   . Seasonal allergies   . Osteopenia   . PONV (postoperative nausea and vomiting)   . Pericardial effusion     Past Surgical History  Procedure Laterality Date  . Right wrist fx    . Rectal polyps removed    . Subxyphoid pericardial window  02/18/2012    Procedure: SUBXYPHOID PERICARDIAL WINDOW;  Surgeon: Ivin Poot, MD;  Location: [facility];  Service: Thoracic;  Laterality: N/A;  TEE    History   Social History  . Marital Status: Widowed    Spouse Name: died Jun 10, 2011    Number of Children: 1  . Years of Education: N/A   Occupational History  . computer/ desk top    Social History Main Topics  . Smoking status: Never Smoker   . Smokeless tobacco: Not on file  . Alcohol Use: No  . Drug Use: No  . Sexual Activity: Not on file   Other Topics Concern  . Not on file   Social History Narrative    Current Outpatient Prescriptions on File Prior to Visit  Medication Sig Dispense Refill  . aspirin 81 MG tablet Take 81 mg by mouth daily.    . Calcium Carb-Cholecalciferol (CALTRATE 600+D) 600-800 MG-UNIT TABS Take by mouth.    . diphenhydrAMINE (BENADRYL) 25 mg capsule Take 25 mg by mouth every 6 (six) hours as needed.    . insulin glargine (LANTUS) 100 UNIT/ML injection Inject 8 Units into the skin at bedtime.     . Insulin Lispro, Human, (HUMALOG  KWIKPEN [place]) Inject 3 Units into the skin 3 (three) times daily with meals. 3 times a day (just before each meal) 3-2-5 units    . pravastatin (PRAVACHOL) 40 MG tablet Take 40 mg by mouth daily.     No current facility-administered medications on file prior to visit.    Allergies  Allergen Reactions  . Januvia [Sitagliptin]     Family History  Problem Relation Age of Onset  . Heart attack Father   . Diabetes Brother   . Heart disease Brother   . Cancer Sister     ovarian    BP 126/74 mmHg  Pulse 94  Temp(Src) 98.3 F (36.8 C) (Oral)  Ht 5' 3.5" (1.613 m)  Wt 115 lb (52.164 kg)  BMI 20.05 kg/m2  SpO2 96%    Review of Systems Denies LOC and weight change.      Objective:   Physical Exam VITAL SIGNS:  See vs page GENERAL: no distress Pulses: dorsalis pedis intact bilat.   Feet: no deformity.  no edema Skin:  no ulcer on the feet.  normal color and temp. Neuro: sensation is intact to touch on the feet   Lab Results  Component Value Date   HGBA1C 9.5* 01/16/2014       Assessment & Plan:  DM: moderate exacerbation.   Noncompliance with cbg recording: I'll work around this as best I can.     Patient is advised the following: Patient Instructions  Please increase the lantus to 8 units at bedtime, and:  change the humalog to 3 times a day (just before each meal) 3-2-5 units. check your blood sugar 4 times a day: before the 3 meals, and at bedtime.  also check if you have symptoms of your blood sugar being too high or too low.  please keep a record of the readings and bring it to your next appointment here.  You can write it on any piece of paper.  please call us sooner if your blood sugar goes below 70, or if you have a lot of readings over 200. Please come back for a follow-up appointment in 2 months.

## 2014-02-18 NOTE — Patient Instructions (Addendum)
Please increase the lantus to 8 units at bedtime, and:  change the humalog to 3 times a day (just before each meal) 3-2-5 units. check your blood sugar 4 times a day: before the 3 meals, and at bedtime.  also check if you have symptoms of your blood sugar being too high or too low.  please keep a record of the readings and bring it to your next appointment here.  You can write it on any piece of paper.  please call us sooner if your blood sugar goes below 70, or if you have a lot of readings over 200. Please come back for a follow-up appointment in 2 months.

## 2014-03-04 ENCOUNTER — Telehealth: Payer: Self-pay | Admitting: Endocrinology

## 2014-03-04 ENCOUNTER — Other Ambulatory Visit: Payer: Self-pay | Admitting: *Deleted

## 2014-03-04 MED ORDER — GLUCOSE BLOOD VI STRP: ORAL_STRIP | Status: DC

## 2014-03-04 NOTE — Telephone Encounter (Signed)
Patient called stating that per last visit Dr. Loanne Drilling had increased her testing to 4x daily   Please call in test strips as she is out   Freestyle lite  Pharmacy: [facility]   Thank you

## 2014-03-07 ENCOUNTER — Telehealth: Payer: Self-pay | Admitting: Endocrinology

## 2014-03-07 NOTE — Telephone Encounter (Signed)
Patient called stating that per last visit Dr. Loanne Drilling had increased her testing to 4x daily   Please call in test strips as she is out   Freestyle lite  Pharmacy: [facility]   Thank you

## 2014-04-24 ENCOUNTER — Ambulatory Visit (INDEPENDENT_AMBULATORY_CARE_PROVIDER_SITE_OTHER): Payer: Medicare Other | Admitting: Endocrinology

## 2014-04-24 ENCOUNTER — Encounter: Payer: Self-pay | Admitting: Endocrinology

## 2014-04-24 VITALS — BP 128/78 | HR 96 | Temp 98.1°F | Ht 63.0 in | Wt 116.0 lb

## 2014-04-24 DIAGNOSIS — E119 Type 2 diabetes mellitus without complications: Secondary | ICD-10-CM

## 2014-04-24 LAB — HEMOGLOBIN A1C: HEMOGLOBIN A1C: 8.8 % — AB (ref 4.6–6.5)

## 2014-04-24 NOTE — Progress Notes (Signed)
Subjective:    Patient ID: Audrey [name], female    DOB: [birth year], 69 y.o.   MRN: [id number]  HPI Pt returns for f/u of diabetes mellitus:  DM type: type 1 is dx'ed, based on lean body habitus and insulin sensitivity.   Dx'ed: 8937 Complications: none. Therapy: insulin since 2009/06/24 GDM: never DKA: never Severe hypoglycemia: never. Pancreatitis: never Other: she takes multiple daily injections; she declines insulin pump. Interval history: she brings a record of her cbg's which i have reviewed today.  It varies from 63-200's.  There is no trend throughout the day.  cbg's are mildly low approx 2-3 times per month.  This happens after a smaller-than-expected meal.  She seldom misses the insulin.   Past Medical History  Diagnosis Date  . Diabetes mellitus   . Hyperlipidemia   . Seasonal allergies   . Osteopenia   . PONV (postoperative nausea and vomiting)   . Pericardial effusion     Past Surgical History  Procedure Laterality Date  . Right wrist fx    . Rectal polyps removed    . Subxyphoid pericardial window  02/18/2012    Procedure: SUBXYPHOID PERICARDIAL WINDOW;  Surgeon: Ivin Poot, MD;  Location: [facility];  Service: Thoracic;  Laterality: N/A;  TEE    History   Social History  . Marital Status: Widowed    Spouse Name: died 25-Jun-2011  . Number of Children: 1  . Years of Education: N/A   Occupational History  . computer/ desk top    Social History Main Topics  . Smoking status: Never Smoker   . Smokeless tobacco: Not on file  . Alcohol Use: No  . Drug Use: No  . Sexual Activity: Not on file   Other Topics Concern  . Not on file   Social History Narrative    Current Outpatient Prescriptions on File Prior to Visit  Medication Sig Dispense Refill  . aspirin 81 MG tablet Take 81 mg by mouth daily.    . Calcium Carb-Cholecalciferol (CALTRATE 600+D) 600-800 MG-UNIT TABS Take by mouth.    . diphenhydrAMINE (BENADRYL) 25 mg capsule Take 25 mg by mouth every 6  (six) hours as needed.    Marland Kitchen glucose blood (FREESTYLE LITE) test strip Use to check blood sugar before each meal and at bedtime (4 times per day) dx code E11.9 125 each 3  . insulin glargine (LANTUS) 100 UNIT/ML injection Inject 9 Units into the skin at bedtime.     . Insulin Lispro, Human, (HUMALOG KWIKPEN [place]) Inject 3-5 Units into the skin 3 (three) times daily with meals.     . pravastatin (PRAVACHOL) 40 MG tablet Take 40 mg by mouth daily.     No current facility-administered medications on file prior to visit.    Allergies  Allergen Reactions  . Januvia [Sitagliptin]     Family History  Problem Relation Age of Onset  . Heart attack Father   . Diabetes Brother   . Heart disease Brother   . Cancer Sister     ovarian    BP 128/78 mmHg  Pulse 96  Temp(Src) 98.1 F (36.7 C) (Oral)  Ht 5\' 3"  (1.6 m)  Wt 116 lb (52.617 kg)  BMI 20.55 kg/m2  SpO2 97%    Review of Systems Denies LOC and weight change    Objective:   Physical Exam VITAL SIGNS:  See vs page GENERAL: no distress.  Pulses: dorsalis pedis intact bilat.   MSK: no deformity of  the feet CV: no leg edema Skin:  no ulcer on the feet.  normal temp on the feet. Neuro: sensation is intact to touch on the feet.    Lab Results  Component Value Date   HGBA1C 8.8* 04/24/2014        Assessment & Plan:  DM: moderate exacerbation.  She does not take enough insulin for the "V-GO" pump to be an option.    Patient is advised the following: Patient Instructions  Please continue the lantus, 8 units at bedtime, and:  change the humalog to 3-5 units (depending on the size of the meal), 3 times a day (just before each meal).   check your blood sugar 4 times a day: before the 3 meals, and at bedtime.  also check if you have symptoms of your blood sugar being too high or too low.  please keep a record of the readings and bring it to your next appointment here.  You can write it on any piece of paper.  please call us  sooner if your blood sugar goes below 70, or if you have a lot of readings over 200. Please come back for a follow-up appointment in 3 months.   addendum: increase lantus 9 units qhs.

## 2014-04-24 NOTE — Patient Instructions (Addendum)
Please continue the lantus, 8 units at bedtime, and:  change the humalog to 3-5 units (depending on the size of the meal), 3 times a day (just before each meal).   check your blood sugar 4 times a day: before the 3 meals, and at bedtime.  also check if you have symptoms of your blood sugar being too high or too low.  please keep a record of the readings and bring it to your next appointment here.  You can write it on any piece of paper.  please call us sooner if your blood sugar goes below 70, or if you have a lot of readings over 200. Please come back for a follow-up appointment in 3 months.

## 2014-04-26 ENCOUNTER — Telehealth: Payer: Self-pay | Admitting: Endocrinology

## 2014-04-26 NOTE — Telephone Encounter (Signed)
Audrey [name] called back returning your call  About lab she ask that you call back.  Thanks Baxter Flattery

## 2014-04-26 NOTE — Telephone Encounter (Signed)
Unable to reach pt will try again at a later time.  

## 2014-05-07 NOTE — Telephone Encounter (Signed)
error 

## 2014-07-24 ENCOUNTER — Other Ambulatory Visit: Payer: Self-pay

## 2014-07-24 ENCOUNTER — Ambulatory Visit (INDEPENDENT_AMBULATORY_CARE_PROVIDER_SITE_OTHER): Payer: Medicare Other | Admitting: Endocrinology

## 2014-07-24 ENCOUNTER — Encounter: Payer: Medicare Other | Attending: Endocrinology | Admitting: Nutrition

## 2014-07-24 ENCOUNTER — Encounter: Payer: Self-pay | Admitting: Endocrinology

## 2014-07-24 VITALS — BP 132/70 | HR 96 | Ht 63.0 in | Wt 117.0 lb

## 2014-07-24 DIAGNOSIS — Z794 Long term (current) use of insulin: Secondary | ICD-10-CM | POA: Insufficient documentation

## 2014-07-24 DIAGNOSIS — E109 Type 1 diabetes mellitus without complications: Secondary | ICD-10-CM | POA: Diagnosis not present

## 2014-07-24 DIAGNOSIS — E119 Type 2 diabetes mellitus without complications: Secondary | ICD-10-CM

## 2014-07-24 DIAGNOSIS — Z713 Dietary counseling and surveillance: Secondary | ICD-10-CM | POA: Insufficient documentation

## 2014-07-24 NOTE — Progress Notes (Signed)
This patient is reporting irratic blood sugars and is asking about insulin pump therapy. We discussed the advantages and disadvantages of pump therapy, as well as cost.  She has chosen either the [place] or OmniPod pump due to it's simplicity.   She was given brochures and we discussed each pumps advantages and disadvantages.  She will look over the brochures and call me if questions.    She was scheduled for a c-peptide and FBS for tomorrow morning and will let me know if she has made a decision.

## 2014-07-24 NOTE — Progress Notes (Signed)
Subjective:    Patient ID: Audrey [name], female    DOB: 10-23-1945, 69 y.o.   MRN: [id number]  HPI Pt returns for f/u of diabetes mellitus:  DM type: type 1 is dx'ed, based on lean body habitus and insulin sensitivity.   Dx'ed: 7564 Complications: none. Therapy: insulin since 06/13/09 GDM: never.   DKA: never. Severe hypoglycemia: never. Pancreatitis: never Other: she takes multiple daily injections; she declines insulin pump. Interval history: she brings a record of her cbg's which i have reviewed today.  It varies from 39-200, but most are in the 100's.  There is no trend throughout the day, except it may be lower in the afternoon, and at HS. pt states she feels well in general.  She misses less than 1 insulin dose per week.   Past Medical History  Diagnosis Date  . Diabetes mellitus   . Hyperlipidemia   . Seasonal allergies   . Osteopenia   . PONV (postoperative nausea and vomiting)   . Pericardial effusion     Past Surgical History  Procedure Laterality Date  . Right wrist fx    . Rectal polyps removed    . Subxyphoid pericardial window  02/18/2012    Procedure: SUBXYPHOID PERICARDIAL WINDOW;  Surgeon: Ivin Poot, MD;  Location: [facility];  Service: Thoracic;  Laterality: N/A;  TEE    History   Social History  . Marital Status: Widowed    Spouse Name: died 06/14/11  . Number of Children: 1  . Years of Education: N/A   Occupational History  . computer/ desk top    Social History Main Topics  . Smoking status: Never Smoker   . Smokeless tobacco: Not on file  . Alcohol Use: No  . Drug Use: No  . Sexual Activity: Not on file   Other Topics Concern  . Not on file   Social History Narrative    Current Outpatient Prescriptions on File Prior to Visit  Medication Sig Dispense Refill  . aspirin 81 MG tablet Take 81 mg by mouth daily.    . Calcium Carb-Cholecalciferol (CALTRATE 600+D) 600-800 MG-UNIT TABS Take by mouth.    . diphenhydrAMINE (BENADRYL) 25 mg capsule  Take 25 mg by mouth every 6 (six) hours as needed.    Marland Kitchen glucose blood (FREESTYLE LITE) test strip Use to check blood sugar before each meal and at bedtime (4 times per day) dx code E11.9 125 each 3  . insulin glargine (LANTUS) 100 UNIT/ML injection Inject 9 Units into the skin at bedtime.     . Insulin Lispro, Human, (HUMALOG KWIKPEN [place]) Inject 3-5 Units into the skin 3 (three) times daily with meals.     . pravastatin (PRAVACHOL) 40 MG tablet Take 40 mg by mouth daily.     No current facility-administered medications on file prior to visit.    Allergies  Allergen Reactions  . Januvia [Sitagliptin]     Family History  Problem Relation Age of Onset  . Heart attack Father   . Diabetes Brother   . Heart disease Brother   . Cancer Sister     ovarian    BP 132/70 mmHg  Pulse 96  Ht 5\' 3"  (1.6 m)  Wt 117 lb (53.071 kg)  BMI 20.73 kg/m2  SpO2 93%    Review of Systems Denies LOC.     Objective:   Physical Exam VITAL SIGNS:  See vs page GENERAL: no distress Pulses: dorsalis pedis intact bilat.   MSK:  no deformity of the feet CV: no leg edema Skin:  no ulcer on the feet.  normal color and temp on the feet. Neuro: sensation is intact to touch on the feet   Lab Results  Component Value Date   HGBA1C 8.5* 07/25/2014       Assessment & Plan:  DM: i discussed with Leonia Reader, RN.  Pt will pursue pump rx.  Same insulin for now.    Patient is advised the following: Patient Instructions  Please continue the same insulin for now Please see [name] today to discuss the pump.   check your blood sugar 4 times a day: before the 3 meals, and at bedtime.  also check if you have symptoms of your blood sugar being too high or too low.  please keep a record of the readings and bring it to your next appointment here.  You can write it on any piece of paper.  please call us sooner if your blood sugar goes below 70, or if you have a lot of readings over 200. Please come back for a  follow-up appointment in 3 months.

## 2014-07-24 NOTE — Patient Instructions (Addendum)
Please continue the same insulin for now Please see [name] today to discuss the pump.   check your blood sugar 4 times a day: before the 3 meals, and at bedtime.  also check if you have symptoms of your blood sugar being too high or too low.  please keep a record of the readings and bring it to your next appointment here.  You can write it on any piece of paper.  please call us sooner if your blood sugar goes below 70, or if you have a lot of readings over 200. Please come back for a follow-up appointment in 3 months.

## 2014-07-25 ENCOUNTER — Other Ambulatory Visit (INDEPENDENT_AMBULATORY_CARE_PROVIDER_SITE_OTHER): Payer: Medicare Other

## 2014-07-25 DIAGNOSIS — E109 Type 1 diabetes mellitus without complications: Secondary | ICD-10-CM

## 2014-07-25 DIAGNOSIS — E119 Type 2 diabetes mellitus without complications: Secondary | ICD-10-CM

## 2014-07-25 LAB — HEMOGLOBIN A1C: Hgb A1c MFr Bld: 8.5 % — ABNORMAL HIGH (ref 4.6–6.5)

## 2014-07-26 ENCOUNTER — Telehealth: Payer: Self-pay | Admitting: Endocrinology

## 2014-07-26 NOTE — Telephone Encounter (Signed)
Patient contacted and advised her A1C results were 8.5. Patient will be notified once C-peptide results come back.

## 2014-07-26 NOTE — Telephone Encounter (Signed)
Patient would like to know if her A1C results are available   Please call patient after 3:15 pm today    Thank you

## 2014-07-27 LAB — C-PEPTIDE: C PEPTIDE: 0.6 ng/mL — AB (ref 1.1–4.4)

## 2014-07-30 ENCOUNTER — Other Ambulatory Visit: Payer: Self-pay

## 2014-07-30 MED ORDER — INSULIN GLARGINE 100 UNIT/ML ~~LOC~~ SOLN: 9.0000 [IU] | Freq: Every day | SUBCUTANEOUS | Status: DC

## 2014-08-06 ENCOUNTER — Telehealth: Payer: Self-pay

## 2014-08-06 ENCOUNTER — Telehealth: Payer: Self-pay | Admitting: Endocrinology

## 2014-08-06 NOTE — Telephone Encounter (Signed)
I contacted the patient. Patient wanted to verify what blood test she is having done tomorrow. I advised she is having the fasting blood glucose done tomorrow. The compnay for her insulin pump requires this blood test. Patient voiced understanding.

## 2014-08-06 NOTE — Telephone Encounter (Signed)
I contacted the patient and left a voicemail advising we need her to come in for a fasting blood glucose. Requested the patient call back to schedule a lab appointment.

## 2014-08-06 NOTE — Telephone Encounter (Signed)
Patient ask you to call her. °

## 2014-08-07 ENCOUNTER — Other Ambulatory Visit: Payer: Self-pay

## 2014-08-07 ENCOUNTER — Other Ambulatory Visit (INDEPENDENT_AMBULATORY_CARE_PROVIDER_SITE_OTHER): Payer: Medicare Other

## 2014-08-07 DIAGNOSIS — E119 Type 2 diabetes mellitus without complications: Secondary | ICD-10-CM

## 2014-08-07 LAB — GLUCOSE, RANDOM: GLUCOSE: 178 mg/dL — AB (ref 70–99)

## 2014-08-08 LAB — C-PEPTIDE: C PEPTIDE: 0.86 ng/mL (ref 0.80–3.90)

## 2014-08-13 ENCOUNTER — Other Ambulatory Visit: Payer: Self-pay | Admitting: Endocrinology

## 2014-08-13 DIAGNOSIS — E109 Type 1 diabetes mellitus without complications: Secondary | ICD-10-CM

## 2014-08-29 ENCOUNTER — Telehealth: Payer: Self-pay | Admitting: Endocrinology

## 2014-08-29 MED ORDER — GLUCOSE BLOOD VI STRP: ORAL_STRIP | Status: DC

## 2014-08-29 MED ORDER — INSULIN LISPRO 100 UNIT/ML (KWIKPEN): 3.0000 [IU] | PEN_INJECTOR | Freq: Three times a day (TID) | SUBCUTANEOUS | Status: DC

## 2014-08-29 MED ORDER — INSULIN GLARGINE 100 UNIT/ML SOLOSTAR PEN: PEN_INJECTOR | SUBCUTANEOUS | Status: DC

## 2014-08-29 NOTE — Telephone Encounter (Signed)
I contacted the pt. Pt stated since coming off the metformin her blood sugars have been consisently over 200. Pt is checking her blood sugar 4 times per day. (1 fasting 2,3,and 4 before meals). Pt is currently taking 9 units of lantus and bedtime and 3-5 units of humalog before each meal. Pt is requesting another oral diabetic medication to be called in to take along with her Insulin. Could you review and please advise during Dr. Cordelia Pen absence? Thanks!

## 2014-08-29 NOTE — Telephone Encounter (Signed)
Since I don't know her history well I would recommend increasing her Lantus to 14 units until Dr. Loanne Drilling comes back

## 2014-08-29 NOTE — Telephone Encounter (Signed)
Pt called saud she has high sugar readings and wants nurse to call her @ [phone]

## 2014-08-30 ENCOUNTER — Other Ambulatory Visit: Payer: Self-pay

## 2014-08-30 MED ORDER — GLUCOSE BLOOD VI STRP: ORAL_STRIP | Status: DC

## 2014-08-30 NOTE — Telephone Encounter (Signed)
I contacted the pt and advised of note below and voiced understanding.  

## 2014-09-03 ENCOUNTER — Telehealth: Payer: Self-pay

## 2014-09-03 NOTE — Telephone Encounter (Signed)
The pt stated she was having issues with the insulin pump being shipped to her. I spoke with [name] about this and she is going to contact a representative and let me know what she is able to find out.

## 2014-09-03 NOTE — Telephone Encounter (Signed)
No, i don't think this would help. What do you think about the pump?  Please let me know

## 2014-09-03 NOTE — Telephone Encounter (Signed)
Could you review the telephone note from 08/29/2014. Pt wanted to know if she could add another oral DM medication. Thanks!

## 2014-09-05 NOTE — Telephone Encounter (Signed)
Pt advised of note below and voiced understanding.  

## 2014-09-05 NOTE — Telephone Encounter (Signed)
I called the pt. She states her insurance is not going to cover the omni pod insulin pump. Pt was advised that her insurance would cover a more "traditional" insulin pump and she does not want to persue these options. Pt stated this morning her fasting blood sugar was 290. Yesterday fasting blood sugar was 322, before dinner 255 and before bed 269. Pt is taking 14 units of Lantus at bed time and 3-5 units of Humalog before each meal. Please advise, Thanks!

## 2014-09-05 NOTE — Telephone Encounter (Signed)
Please increase humalog to 6 units 3 times a day (just before each meal) Please call us next week to tell us how the blood sugar is doing

## 2014-09-10 ENCOUNTER — Telehealth: Payer: Self-pay | Admitting: Endocrinology

## 2014-09-10 NOTE — Telephone Encounter (Signed)
Pt has had a few times the blood sugar had dropped over the weekend she does think the increase in her insulin dosages should help the more she is on them.

## 2014-09-11 NOTE — Telephone Encounter (Signed)
As cbg's are so variable, please continue the same insulins.

## 2014-09-11 NOTE — Telephone Encounter (Signed)
Pt advised of note below and voiced understanding. Pt wanted Md to be informed today at 3pm her blood sugar dropped to 39.

## 2014-09-11 NOTE — Telephone Encounter (Signed)
Pt called to report blood sugar readings.  7/1:  Fasting: 302 Before Lunch: 303 Before Supper: 260 Before Bed: 119  7/2: Fasting: 230 Before Lunch: 97 Before Dinner: 100  7/4:  Fasting: 155 Before Dinner: 157  7/5:  Fasting: 205  Before Lunch:232 Before Supper: 255  7/6: Fasting: 186  Before Lunch: 200  Pt confirmed she is taking 6 units of humalog before each meal and 14 units of Lantus.  Thanks!

## 2014-09-11 NOTE — Addendum Note (Signed)
Addended by: Renato Shin on: 09/11/2014 04:50 PM   Modules accepted: Orders, Medications

## 2014-09-11 NOTE — Telephone Encounter (Signed)
Ok, please reduce lunch humalog to 3 units

## 2014-09-12 NOTE — Telephone Encounter (Signed)
Pt advised of note below and voiced understanding.  

## 2014-09-17 ENCOUNTER — Telehealth: Payer: Self-pay | Admitting: Endocrinology

## 2014-09-17 NOTE — Telephone Encounter (Signed)
Pt called to give sugar readings, please call pt at home after 3pm

## 2014-09-17 NOTE — Telephone Encounter (Signed)
I will call this pt after 3pm to receive the blood sugar readings.

## 2014-09-20 ENCOUNTER — Telehealth: Payer: Self-pay | Admitting: Endocrinology

## 2014-09-20 ENCOUNTER — Telehealth: Payer: Self-pay

## 2014-09-20 NOTE — Telephone Encounter (Signed)
Please reduce lantus to 6 units at bedtime i'll see you next time.   Please call next week, to report cbg's

## 2014-09-20 NOTE — Telephone Encounter (Signed)
09/16/2014 breakfast 159 lunch 50 dinner 198 bedtime 39 09/17/2014 breakfast 252 lunch 315 dinner 80  09/18/2014 breakfast 151 lunch 59 dinner 194 09/19/2014 breakfast 154 lunch 57 dinner 106 bedtime 45 09/20/2014 breakfast 157   These are the pt blood sugar readings.Please advise pt as to what she should do to keep them in the normal ranges.

## 2014-09-20 NOTE — Telephone Encounter (Signed)
Pt called to give sugar readings please give pt a call back @ [phone]

## 2014-09-20 NOTE — Telephone Encounter (Signed)
Spoke with pt and she understands that she is to reduce Lantus to 6 units at bedtime.

## 2014-09-20 NOTE — Telephone Encounter (Signed)
Left pt a VM with this information.I will try and call her back.

## 2014-10-08 ENCOUNTER — Telehealth: Payer: Self-pay | Admitting: Endocrinology

## 2014-10-08 NOTE — Telephone Encounter (Signed)
Increase Lantus to 10 units and reduce morning Humalog to 4 units

## 2014-10-08 NOTE — Telephone Encounter (Signed)
Pt advised of note below and voiced understanding.  

## 2014-10-08 NOTE — Telephone Encounter (Signed)
Patient ask if you would call her about her b/s readings, she is at work call her before 2:45 pm

## 2014-10-08 NOTE — Telephone Encounter (Signed)
Pt called and reported blood sugar readings. Blood sugar readings are from 7/23-7/28.  7/23: Fasting: 230 2 hours before lunch: 101 2 hours before supper: 191  7/24:  Fasting: 246  2 hours before lunch: 136 2 hours before supper: 207   7/25 Fasting: 253  2 hours before lunch: 92 2 hours before supper: 139  7/26: Fasting: 181 2 hours before lunch: 242  2 hours before supper: 95  7/27: Fasting: 233 2 hours before lunch: 101  2 hours before supper: 103  7/28:  Fasting: 205  2 hours before lunch: 91 2 hours before supper: 148  Pt is taking Humalog 6-3-6 and 6 units of Lantus at bedtime.  Could you please review during Dr. Cordelia Pen absence? thanks!

## 2014-10-18 ENCOUNTER — Telehealth: Payer: Self-pay | Admitting: Endocrinology

## 2014-10-18 NOTE — Telephone Encounter (Signed)
Readings: 8/5 AM 216, lunch 269, dinner 84 8/6 lunch 55, 211 dinner, 68 bedtime 8/7 150 AM 104 Lunch 86 dinner 8/8 AM 225 lunch 100 dinner 138 8/9 am 180 lunch 257 dinner 161 8/10 am 121 lunch 96 dinner 119 8/11 154 am 74 lunch 124 dinner

## 2014-10-18 NOTE — Telephone Encounter (Signed)
See note below. Pt is currently taking 10 units of Lantus and 4-3-6 units of humalog before each meal. Please advise, Thanks!

## 2014-10-18 NOTE — Telephone Encounter (Signed)
Left voicemail advising of note below. Requested call back if the pt would like to discuss.  

## 2014-10-18 NOTE — Telephone Encounter (Signed)
Reduce breakfast humalog to 2 units  Ret as scheduled

## 2014-11-07 ENCOUNTER — Ambulatory Visit (INDEPENDENT_AMBULATORY_CARE_PROVIDER_SITE_OTHER): Payer: Medicare Other | Admitting: Endocrinology

## 2014-11-07 ENCOUNTER — Encounter: Payer: Self-pay | Admitting: Endocrinology

## 2014-11-07 VITALS — BP 128/87 | HR 79 | Temp 97.7°F | Ht 63.0 in | Wt 117.0 lb

## 2014-11-07 DIAGNOSIS — E119 Type 2 diabetes mellitus without complications: Secondary | ICD-10-CM | POA: Diagnosis not present

## 2014-11-07 LAB — POCT GLYCOSYLATED HEMOGLOBIN (HGB A1C): Hemoglobin A1C: 8.6

## 2014-11-07 MED ORDER — INSULIN GLARGINE 100 UNIT/ML ~~LOC~~ SOLN: 10.0000 [IU] | Freq: Every day | SUBCUTANEOUS | Status: DC

## 2014-11-07 NOTE — Progress Notes (Signed)
Subjective:    Patient ID: Audrey [name], female    DOB: [birth year], 69 y.o.   MRN: [id number]  HPI Pt returns for f/u of diabetes mellitus:  DM type: type 1 is dx'ed, based on lean body habitus and insulin sensitivity.   Dx'ed: 6237 Complications: none. Therapy: insulin since 06/10/2009 GDM: never.   DKA: never. Severe hypoglycemia: never. Pancreatitis: never. Other: she takes multiple daily injections; she declines insulin pump, at least for now. Interval history: She takes 10 units of Lantus qhs, and 2-3-6 units of humalog before each meal.  no cbg record, but states cbg last week was 35, 2 hrs after the evening meal.  She feels as though the cbg is very affected by the size of a meal. Past Medical History  Diagnosis Date  . Diabetes mellitus   . Hyperlipidemia   . Seasonal allergies   . Osteopenia   . PONV (postoperative nausea and vomiting)   . Pericardial effusion     Past Surgical History  Procedure Laterality Date  . Right wrist fx    . Rectal polyps removed    . Subxyphoid pericardial window  02/18/2012    Procedure: SUBXYPHOID PERICARDIAL WINDOW;  Surgeon: Ivin Poot, MD;  Location: [facility] OR;  Service: Thoracic;  Laterality: N/A;  TEE    Social History   Social History  . Marital Status: Widowed    Spouse Name: died 06-11-11  . Number of Children: 1  . Years of Education: N/A   Occupational History  . computer/ desk top    Social History Main Topics  . Smoking status: Never Smoker   . Smokeless tobacco: Not on file  . Alcohol Use: No  . Drug Use: No  . Sexual Activity: Not on file   Other Topics Concern  . Not on file   Social History Narrative    Current Outpatient Prescriptions on File Prior to Visit  Medication Sig Dispense Refill  . aspirin 81 MG tablet Take 81 mg by mouth daily.    . Calcium Carb-Cholecalciferol (CALTRATE 600+D) 600-800 MG-UNIT TABS Take by mouth.    . diphenhydrAMINE (BENADRYL) 25 mg capsule Take 25 mg by mouth every 6 (six)  hours as needed.    Marland Kitchen glucose blood (FREESTYLE LITE) test strip Use to check blood sugar before each meal and at bedtime (4 times per day) dx code E11.9 125 each 3  . insulin lispro (HUMALOG KWIKPEN) 100 UNIT/ML KiwkPen Inject 0.03-0.05 mLs (3-5 Units total) into the skin 3 (three) times daily with meals. 15 mL 3  . pravastatin (PRAVACHOL) 40 MG tablet Take 40 mg by mouth daily.     No current facility-administered medications on file prior to visit.    Allergies  Allergen Reactions  . Januvia [Sitagliptin]     Family History  Problem Relation Age of Onset  . Heart attack Father   . Diabetes Brother   . Heart disease Brother   . Cancer Sister     ovarian    BP 128/87 mmHg  Pulse 79  Temp(Src) 97.7 F (36.5 C) (Oral)  Ht 5\' 3"  (1.6 m)  Wt 117 lb (53.071 kg)  BMI 20.73 kg/m2  SpO2 96%  Review of Systems Denies LOC    Objective:   Physical Exam VITAL SIGNS:  See vs page GENERAL: no distress Pulses: dorsalis pedis intact bilat.   MSK: no deformity of the feet CV: no leg edema Skin:  no ulcer on the feet.  normal color  and temp on the feet. Neuro: sensation is intact to touch on the feet.    A1c=8.6%    Assessment & Plan:  DM: she needs increased rx.  She needs to dose humalog based on CHO.  I discussed this with Audrey [name], RD, and with patient.  The patient declines for now, but says she'll think it over.   Patient is advised the following: Patient Instructions  Please continue the same insulin for now. Please come back for a follow-up appointment in 3 months.  Please let me know if you want to see our dietician, Audrey [name], to consider basing your humalog based on the carbohydrate content of your meals.  check your blood sugar 4 times a day: before the 3 meals, and at bedtime.  also check if you have symptoms of your blood sugar being too high or too low.  please keep a record of the readings and bring it to your next appointment here.  You can write it on any  piece of paper.  please call us sooner if your blood sugar goes below 70, or if you have a lot of readings over 200. Please come back for a follow-up appointment in 3 months.

## 2014-11-07 NOTE — Patient Instructions (Addendum)
Please continue the same insulin for now. Please come back for a follow-up appointment in 3 months.  Please let me know if you want to see our dietician, Antonieta Iba, to consider basing your humalog based on the carbohydrate content of your meals.  check your blood sugar 4 times a day: before the 3 meals, and at bedtime.  also check if you have symptoms of your blood sugar being too high or too low.  please keep a record of the readings and bring it to your next appointment here.  You can write it on any piece of paper.  please call us sooner if your blood sugar goes below 70, or if you have a lot of readings over 200. Please come back for a follow-up appointment in 3 months.

## 2015-02-10 ENCOUNTER — Ambulatory Visit: Payer: Medicare Other | Admitting: Endocrinology

## 2015-02-24 ENCOUNTER — Encounter: Payer: Self-pay | Admitting: Endocrinology

## 2015-02-24 ENCOUNTER — Ambulatory Visit (INDEPENDENT_AMBULATORY_CARE_PROVIDER_SITE_OTHER): Payer: Medicare Other | Admitting: Endocrinology

## 2015-02-24 ENCOUNTER — Ambulatory Visit: Payer: Medicare Other | Admitting: Endocrinology

## 2015-02-24 VITALS — BP 122/73 | HR 84 | Temp 97.8°F | Ht 63.0 in | Wt 116.0 lb

## 2015-02-24 DIAGNOSIS — E119 Type 2 diabetes mellitus without complications: Secondary | ICD-10-CM | POA: Diagnosis not present

## 2015-02-24 DIAGNOSIS — Z794 Long term (current) use of insulin: Secondary | ICD-10-CM | POA: Diagnosis not present

## 2015-02-24 LAB — MICROALBUMIN / CREATININE URINE RATIO
CREATININE, U: 97.9 mg/dL
MICROALB/CREAT RATIO: 2.1 mg/g (ref 0.0–30.0)
Microalb, Ur: 2.1 mg/dL — ABNORMAL HIGH (ref 0.0–1.9)

## 2015-02-24 LAB — POCT GLYCOSYLATED HEMOGLOBIN (HGB A1C): Hemoglobin A1C: 9.8

## 2015-02-24 MED ORDER — INSULIN GLARGINE 100 UNIT/ML ~~LOC~~ SOLN: 15.0000 [IU] | SUBCUTANEOUS | Status: DC

## 2015-02-24 MED ORDER — INSULIN LISPRO 100 UNIT/ML (KWIKPEN): 5.0000 [IU] | PEN_INJECTOR | Freq: Every day | SUBCUTANEOUS | Status: DC

## 2015-02-24 NOTE — Patient Instructions (Addendum)
Please increase the lantus to 15 units, and take it in the morning, and: Decrease the humalog to none at breakfast or lunch, and 5 units with the evening meal.   Please take these amounts no matter what your blood sugar is.  check your blood sugar 4 times a day: before the 3 meals, and at bedtime.  also check if you have symptoms of your blood sugar being too high or too low.  please keep a record of the readings and bring it to your next appointment here.  You can write it on any piece of paper.  please call us sooner if your blood sugar goes below 70, or if you have a lot of readings over 200. Please come back for a follow-up appointment in 2 months.

## 2015-02-24 NOTE — Progress Notes (Signed)
Subjective:    Patient ID: Audrey [name], female    DOB: [birth year], 69 y.o.   MRN: [id number]  HPI Pt returns for f/u of diabetes mellitus:  DM type: type 1 is dx'ed, based on lean body habitus and insulin sensitivity.   Dx'ed: [id number] Complications: none. Therapy: insulin since 06-21-09 GDM: never.   DKA: never. Severe hypoglycemia: never. Pancreatitis: never. Other: she takes multiple daily injections; insurance declined omnipod; she declines any other type of  insulin pump, at least for now. Interval history: She misses approx 2 insulin doses per month.  She has mild hypoglycemia approx twice per month.  This happens more commonly at lunch or afternoon.  she brings a record of her cbg's which i have reviewed today.  cbg's continue to be extremely variable.  She sometimes takes more than the prescribed dosage of humalog.  Past Medical History  Diagnosis Date  . Diabetes mellitus ([place])   . Hyperlipidemia   . Seasonal allergies   . Osteopenia   . PONV (postoperative nausea and vomiting)   . Pericardial effusion     Past Surgical History  Procedure Laterality Date  . Right wrist fx    . Rectal polyps removed    . Subxyphoid pericardial window  02/18/2012    Procedure: SUBXYPHOID PERICARDIAL WINDOW;  Surgeon: Ivin Poot, MD;  Location: [facility] OR;  Service: Thoracic;  Laterality: N/A;  TEE    Social History   Social History  . Marital Status: Widowed    Spouse Name: died 06/22/2011  . Number of Children: 1  . Years of Education: N/A   Occupational History  . computer/ desk top    Social History Main Topics  . Smoking status: Never Smoker   . Smokeless tobacco: Not on file  . Alcohol Use: No  . Drug Use: No  . Sexual Activity: Not on file   Other Topics Concern  . Not on file   Social History Narrative    Current Outpatient Prescriptions on File Prior to Visit  Medication Sig Dispense Refill  . aspirin 81 MG tablet Take 81 mg by mouth daily.    . Calcium  Carb-Cholecalciferol (CALTRATE 600+D) 600-800 MG-UNIT TABS Take by mouth.    . diphenhydrAMINE (BENADRYL) 25 mg capsule Take 25 mg by mouth every 6 (six) hours as needed.    Marland Kitchen glucose blood (FREESTYLE LITE) test strip Use to check blood sugar before each meal and at bedtime (4 times per day) dx code E11.9 125 each 3  . pravastatin (PRAVACHOL) 40 MG tablet Take 40 mg by mouth daily.     No current facility-administered medications on file prior to visit.    Allergies  Allergen Reactions  . Januvia [Sitagliptin]     Family History  Problem Relation Age of Onset  . Heart attack Father   . Diabetes Brother   . Heart disease Brother   . Cancer Sister     ovarian    BP 122/73 mmHg  Pulse 84  Temp(Src) 97.8 F (36.6 C) (Oral)  Ht 5\' 3"  (1.6 m)  Wt 116 lb (52.617 kg)  BMI 20.55 kg/m2  SpO2 98%   Review of Systems Denies LOC    Objective:   Physical Exam VITAL SIGNS:  See vs page GENERAL: no distress SKIN:  Insulin injection sites at the anterior abdomen are normal    A1c=9.8%    Assessment & Plan:  DM: ongoing poor control.  she needs a simpler regimen.  Patient is advised the following: Patient Instructions  Please increase the lantus to 15 units, and take it in the morning, and: Decrease the humalog to none at breakfast or lunch, and 5 units with the evening meal.   Please take these amounts no matter what your blood sugar is.  check your blood sugar 4 times a day: before the 3 meals, and at bedtime.  also check if you have symptoms of your blood sugar being too high or too low.  please keep a record of the readings and bring it to your next appointment here.  You can write it on any piece of paper.  please call us sooner if your blood sugar goes below 70, or if you have a lot of readings over 200. Please come back for a follow-up appointment in 2 months.

## 2015-02-27 ENCOUNTER — Telehealth: Payer: Self-pay | Admitting: Endocrinology

## 2015-02-27 NOTE — Telephone Encounter (Signed)
Please continue the same insulin for now. i'll see you next time.

## 2015-02-27 NOTE — Telephone Encounter (Signed)
See note below and please advise, Thanks! 

## 2015-02-27 NOTE — Telephone Encounter (Signed)
Pt advised of note below and voiced understanding.  

## 2015-02-27 NOTE — Telephone Encounter (Signed)
Patient b/s  12/20 7:00 mor 283 5:30 Lun 311 9:30 Din 85  12/21 7:00 Mor 89 5:30 Lun 384 9:30 Din 321  7:00 Mor 228

## 2015-03-08 ENCOUNTER — Other Ambulatory Visit: Payer: Self-pay | Admitting: Endocrinology

## 2015-03-11 ENCOUNTER — Telehealth: Payer: Self-pay | Admitting: Endocrinology

## 2015-03-11 MED ORDER — GLUCOSE BLOOD VI STRP: ORAL_STRIP | Status: DC

## 2015-03-11 NOTE — Telephone Encounter (Signed)
Left a voicemail advising of the not below. Requested a call back if the pt would like to discuss.

## 2015-03-11 NOTE — Telephone Encounter (Signed)
See note below about pt's blood sugar readings.

## 2015-03-11 NOTE — Telephone Encounter (Signed)
Please increase the lantus to 20 units each morning, and: Decrease the humalog to none at breakfast or lunch, and 3 units with the evening meal.

## 2015-03-11 NOTE — Telephone Encounter (Signed)
Patient need new prescription for test strips,  B/S 12/26 Mor 327 lunch 364 Dinner 120 Bed 116 12/27 Mor 132 lunch 310 Dinner 260 Bed 136 12/28 Mor 239 lunch 314 dinner  -      Bed  59 12/29 Mor 249 lunch 362 dinner 278  Bed 170 12/30 Mor 157 lunch 319 dinner 281  Bed  - 12/31 Mor 187 lunch 274 dinner 246  Bed  - 1/1    Mor  303 lunch 310 dinner -       Bed 139 1/2    Mor 126  lunch 254 dinner -       Bed 222 1/3    Mor 219

## 2015-04-09 ENCOUNTER — Telehealth: Payer: Self-pay | Admitting: Nutrition

## 2015-04-09 NOTE — Telephone Encounter (Signed)
Pt wants to make appt with linda after her appt with Dr. Dwyane Dee

## 2015-04-29 ENCOUNTER — Encounter: Payer: Self-pay | Admitting: Endocrinology

## 2015-04-29 ENCOUNTER — Encounter: Payer: Medicare HMO | Attending: Endocrinology | Admitting: Nutrition

## 2015-04-29 ENCOUNTER — Ambulatory Visit (INDEPENDENT_AMBULATORY_CARE_PROVIDER_SITE_OTHER): Payer: Medicare Other | Admitting: Endocrinology

## 2015-04-29 VITALS — BP 128/80 | HR 86 | Temp 98.1°F | Ht 63.0 in | Wt 116.0 lb

## 2015-04-29 DIAGNOSIS — Z794 Long term (current) use of insulin: Secondary | ICD-10-CM

## 2015-04-29 DIAGNOSIS — E109 Type 1 diabetes mellitus without complications: Secondary | ICD-10-CM

## 2015-04-29 DIAGNOSIS — E119 Type 2 diabetes mellitus without complications: Secondary | ICD-10-CM

## 2015-04-29 LAB — POCT GLYCOSYLATED HEMOGLOBIN (HGB A1C): HEMOGLOBIN A1C: 8.1

## 2015-04-29 NOTE — Progress Notes (Signed)
Subjective:    Patient ID: Audrey [name], female    DOB: 10/09/1945, 70 y.o.   MRN: [id number]  HPI Pt returns for f/u of diabetes mellitus:  DM type: type 1 is dx'ed, based on lean body habitus and insulin sensitivity.   Dx'ed: [id number] Complications: none. Therapy: insulin since 06-14-09. GDM: never.   DKA: never. Severe hypoglycemia: never. Pancreatitis: never.  Other: she takes multiple daily injections; insurance declined omnipod; she declines any other type of  insulin pump, at least for now. Interval history: she brings a record of her cbg's which i have reviewed today.  It varies from 35-200's.  It is highest at supper, and lowest at lunch.  It is lower on days when she is active. She sometimes takes humalog up to 5 units with breakfast or lunch, depending on her cbg.  Past Medical History  Diagnosis Date  . Diabetes mellitus ([place])   . Hyperlipidemia   . Seasonal allergies   . Osteopenia   . PONV (postoperative nausea and vomiting)   . Pericardial effusion     Past Surgical History  Procedure Laterality Date  . Right wrist fx    . Rectal polyps removed    . Subxyphoid pericardial window  02/18/2012    Procedure: SUBXYPHOID PERICARDIAL WINDOW;  Surgeon: Ivin Poot, MD;  Location: [facility] OR;  Service: Thoracic;  Laterality: N/A;  TEE    Social History   Social History  . Marital Status: Widowed    Spouse Name: died 06-15-2011  . Number of Children: 1  . Years of Education: N/A   Occupational History  . computer/ desk top    Social History Main Topics  . Smoking status: Never Smoker   . Smokeless tobacco: Not on file  . Alcohol Use: No  . Drug Use: No  . Sexual Activity: Not on file   Other Topics Concern  . Not on file   Social History Narrative    Current Outpatient Prescriptions on File Prior to Visit  Medication Sig Dispense Refill  . aspirin 81 MG tablet Take 81 mg by mouth daily.    . Calcium Carb-Cholecalciferol (CALTRATE 600+D) 600-800 MG-UNIT TABS Take  by mouth.    . diphenhydrAMINE (BENADRYL) 25 mg capsule Take 25 mg by mouth every 6 (six) hours as needed.    Marland Kitchen glucose blood (FREESTYLE LITE) test strip USE TO CHECK GLUCOSE AFTER EACH MEAL AND AT BEDTIME (4 TIMES PER DAY) 150 each 6  . insulin glargine (LANTUS) 100 UNIT/ML injection Inject 0.15 mLs (15 Units total) into the skin every morning. 10 mL 1  . insulin lispro (HUMALOG KWIKPEN) 100 UNIT/ML KiwkPen Inject 0.05 mLs (5 Units total) into the skin daily with supper. (Patient taking differently: Inject 3 Units into the skin daily with supper. 1-2 units with meals depending on blood sugar.) 15 mL 3  . pravastatin (PRAVACHOL) 40 MG tablet Take 40 mg by mouth daily.     No current facility-administered medications on file prior to visit.    Allergies  Allergen Reactions  . Januvia [Sitagliptin]     Family History  Problem Relation Age of Onset  . Heart attack Father   . Diabetes Brother   . Heart disease Brother   . Cancer Sister     ovarian    BP 128/80 mmHg  Pulse 86  Temp(Src) 98.1 F (36.7 C) (Oral)  Ht 5\' 3"  (1.6 m)  Wt 116 lb (52.617 kg)  BMI 20.55 kg/m2  SpO2  96%  Review of Systems Denies LOC    Objective:   Physical Exam VITAL SIGNS:  See vs page GENERAL: no distress PSYCH: Alert and well-oriented.  Does not appear anxious nor depressed.   A1c=8.1%    Assessment & Plan:  DM: she needs increased rx  Patient is advised the following: Patient Instructions  Please continue the lantus, 15 units, and take it in the morning, and:  take the humalog, 5 units with the evening meal.   You can take humalog with breakfast or lunch, but it is not safe to take more than 2 units at a time.   Please take these amounts no matter what your blood sugar is.  check your blood sugar 4 times a day: before the 3 meals, and at bedtime.  also check if you have symptoms of your blood sugar being too high or too low.  please keep a record of the readings and bring it to your next  appointment here.  You can write it on any piece of paper.  please call us sooner if your blood sugar goes below 70, or if you have a lot of readings over 200. Please come back for a follow-up appointment in 2 months.

## 2015-04-29 NOTE — Patient Instructions (Addendum)
Please continue the lantus, 15 units, and take it in the morning, and:  take the humalog, 5 units with the evening meal.   You can take humalog with breakfast or lunch, but it is not safe to take more than 2 units at a time.   Please take these amounts no matter what your blood sugar is.  check your blood sugar 4 times a day: before the 3 meals, and at bedtime.  also check if you have symptoms of your blood sugar being too high or too low.  please keep a record of the readings and bring it to your next appointment here.  You can write it on any piece of paper.  please call us sooner if your blood sugar goes below 70, or if you have a lot of readings over 200. Please come back for a follow-up appointment in 2 months.

## 2015-05-07 NOTE — Progress Notes (Signed)
We discussed the advantages and disadvantages of insulin pump therapy.  She was shown all of the pumps and we discussed each pump's advantages and disadvantages.  She is wanting simplicity, and was given brochures on all of the pumps to review.  She was incouraged to review each company's web sites, or to call each pump's representative, and she agreed to do this.   We discussed the need to count carbs and the need to see a dietitian to review this.   She had no final questions.

## 2015-05-07 NOTE — Patient Instructions (Signed)
Review information given, and to to websites for additional information. Call if questions.

## 2015-05-08 ENCOUNTER — Telehealth: Payer: Self-pay | Admitting: Endocrinology

## 2015-05-08 DIAGNOSIS — E119 Type 2 diabetes mellitus without complications: Secondary | ICD-10-CM | POA: Insufficient documentation

## 2015-05-08 DIAGNOSIS — M25522 Pain in left elbow: Secondary | ICD-10-CM

## 2015-05-08 DIAGNOSIS — E109 Type 1 diabetes mellitus without complications: Secondary | ICD-10-CM

## 2015-05-08 NOTE — Telephone Encounter (Signed)
Pt advised of note below and scheduled for 05/13/2015.

## 2015-05-08 NOTE — Telephone Encounter (Signed)
please call patient: Form requires fasting blood tests. i have ordered for you.

## 2015-05-13 ENCOUNTER — Other Ambulatory Visit (INDEPENDENT_AMBULATORY_CARE_PROVIDER_SITE_OTHER): Payer: Medicare HMO

## 2015-05-13 DIAGNOSIS — E109 Type 1 diabetes mellitus without complications: Secondary | ICD-10-CM

## 2015-05-13 LAB — BASIC METABOLIC PANEL
BUN: 20 mg/dL (ref 6–23)
CHLORIDE: 104 meq/L (ref 96–112)
CO2: 27 meq/L (ref 19–32)
CREATININE: 0.73 mg/dL (ref 0.40–1.20)
Calcium: 9.9 mg/dL (ref 8.4–10.5)
GFR: 83.73 mL/min (ref >60.00)
Glucose, Bld: 195 mg/dL — ABNORMAL HIGH (ref 70–99)
Potassium: 4.1 mEq/L (ref 3.5–5.1)
Sodium: 142 mEq/L (ref 135–145)

## 2015-05-14 LAB — C-PEPTIDE: C-Peptide: 0.68 ng/mL — ABNORMAL LOW (ref 0.80–3.85)

## 2015-05-20 ENCOUNTER — Telehealth: Payer: Self-pay | Admitting: Endocrinology

## 2015-05-20 NOTE — Telephone Encounter (Signed)
PT said that she was returning a voicemail that was left?

## 2015-05-20 NOTE — Telephone Encounter (Signed)
See result note.  

## 2015-05-22 DIAGNOSIS — E109 Type 1 diabetes mellitus without complications: Secondary | ICD-10-CM | POA: Diagnosis not present

## 2015-06-01 ENCOUNTER — Telehealth: Payer: Self-pay | Admitting: Endocrinology

## 2015-06-01 NOTE — Telephone Encounter (Signed)
Please start with: continue basal rate of 0.3 units/hr, except for units/hr, 3 am to 6 am continue mealtime bolus of 1 unit/ 50 grams carbohydrate. continue correction bolus (which some people call "sensitivity," or "insulin sensitivity ratio," or just "isr") of 1 unit for each 100 by which your glucose exceeds 100 Please have pt see both of [place] on weds 06/04/15, thank you.

## 2015-06-02 DIAGNOSIS — R636 Underweight: Secondary | ICD-10-CM | POA: Diagnosis not present

## 2015-06-02 DIAGNOSIS — E1165 Type 2 diabetes mellitus with hyperglycemia: Secondary | ICD-10-CM | POA: Diagnosis not present

## 2015-06-02 DIAGNOSIS — Z1231 Encounter for screening mammogram for malignant neoplasm of breast: Secondary | ICD-10-CM | POA: Diagnosis not present

## 2015-06-02 DIAGNOSIS — I10 Essential (primary) hypertension: Secondary | ICD-10-CM | POA: Diagnosis not present

## 2015-06-02 DIAGNOSIS — Z794 Long term (current) use of insulin: Secondary | ICD-10-CM | POA: Diagnosis not present

## 2015-06-02 DIAGNOSIS — E782 Mixed hyperlipidemia: Secondary | ICD-10-CM | POA: Diagnosis not present

## 2015-06-09 ENCOUNTER — Encounter: Payer: Medicare HMO | Attending: Endocrinology | Admitting: Nutrition

## 2015-06-09 DIAGNOSIS — Z029 Encounter for administrative examinations, unspecified: Secondary | ICD-10-CM | POA: Diagnosis present

## 2015-06-09 DIAGNOSIS — E109 Type 1 diabetes mellitus without complications: Secondary | ICD-10-CM

## 2015-06-10 ENCOUNTER — Encounter: Payer: Medicare HMO | Admitting: Nutrition

## 2015-06-10 DIAGNOSIS — R928 Other abnormal and inconclusive findings on diagnostic imaging of breast: Secondary | ICD-10-CM | POA: Diagnosis not present

## 2015-06-10 DIAGNOSIS — Z794 Long term (current) use of insulin: Principal | ICD-10-CM

## 2015-06-10 DIAGNOSIS — R922 Inconclusive mammogram: Secondary | ICD-10-CM | POA: Diagnosis not present

## 2015-06-10 DIAGNOSIS — E118 Type 2 diabetes mellitus with unspecified complications: Secondary | ICD-10-CM

## 2015-06-11 ENCOUNTER — Encounter: Payer: Medicare HMO | Admitting: Nutrition

## 2015-06-11 DIAGNOSIS — IMO0001 Reserved for inherently not codable concepts without codable children: Secondary | ICD-10-CM

## 2015-06-11 DIAGNOSIS — Z794 Long term (current) use of insulin: Principal | ICD-10-CM

## 2015-06-11 DIAGNOSIS — E119 Type 2 diabetes mellitus without complications: Principal | ICD-10-CM

## 2015-06-11 NOTE — Progress Notes (Signed)
Patient reported no difficulty giving boluses yesterday, and no difficulty disconnecting infusion set for shower, or sleeping with the pump.   She did a cartridge and infusion set change with much assistance from me.  She will do another one on Monday with me for more help with this.   Basal rate: 0.3u/hr, 3AM-6AM: 0.4uhr. 6AM-MN: 0.3u/hr.  Target: 100, ISF: 100 , and I/C ratio: 50.   Blood sugar acS: 229,  HS: 88.  FBS (6AM): 158, she exercised for 30 min. And blood sugar was 120acB. No changes made to settings.

## 2015-06-11 NOTE — Patient Instructions (Signed)
1.  Review the steps on how to give a bolus 2.  Call if questions.

## 2015-06-11 NOTE — Progress Notes (Signed)
Audrey [name] came in today saying she was able to give the bolus twice, but twice she had some difficulty.  We reviewed this again several times, and she made notes on her handout the the steps for this. She reported that she thought she was ready to connect to the pump, so she inserted a Mio 78mm infusion set into her upper left abdomen, after being shown how to do this.    We reviewed the need to not take any more Lantus, and she was given my number to call if she had questions today.  I will see her tomorrow to review cartridge change for the end of the week, and we will review the above bolus and cartridge change steps.

## 2015-06-11 NOTE — Progress Notes (Signed)
This patient came in, very upset that she was not able to follow the video about how to fill a cartridge and give a bolus.  She thinks that she " can not do this".   We discussed how this pump works, and I assured her that we would take this very slowly, and that she did not need to begin this pump today. We reviewed the prepump list and she says that she has a good understanding of the difference between basal and bolus insulin delivery.  She has counted carbs, but may need to review this.   She was shown how to fill a cartridge and she did this with some assistance from me.  She did not connect to the pump, but rather, we reviewed how to give a bolus several times, and written directions were given for this.  She felt that she did not feel comfortable enough with doing this, to start on the pump today. She was told to go home and practice giving a bolus several times, especially when she eats, but that she needed to give insulin via her pen for this.  She agreed to do this and to give her Lantus today and we would revisit this tomorrow.

## 2015-06-11 NOTE — Patient Instructions (Signed)
Review video on how to change the cartridge before returning on Monday. Call help line if questions after today.

## 2015-06-11 NOTE — Patient Instructions (Signed)
Review how to give a bolus and change the cartridge on the video. Bring vial of Humalog tomorrow to change out the cartridge.

## 2015-06-16 ENCOUNTER — Telehealth: Payer: Self-pay | Admitting: Endocrinology

## 2015-06-16 NOTE — Telephone Encounter (Signed)
Homer from from [facility] called  asking if you recieved the certificate of medical necessity form. [phone]  Fax # [phone]

## 2015-06-17 NOTE — Telephone Encounter (Signed)
Please read message below.  

## 2015-06-17 NOTE — Telephone Encounter (Signed)
Paper work has been received completed by Dr. Loanne Drilling and faxed back.

## 2015-06-18 ENCOUNTER — Other Ambulatory Visit: Payer: Self-pay

## 2015-06-18 ENCOUNTER — Encounter: Payer: Medicare HMO | Admitting: Nutrition

## 2015-06-18 ENCOUNTER — Telehealth: Payer: Self-pay | Admitting: Endocrinology

## 2015-06-18 DIAGNOSIS — E109 Type 1 diabetes mellitus without complications: Secondary | ICD-10-CM

## 2015-06-18 MED ORDER — INSULIN ASPART 100 UNIT/ML ~~LOC~~ SOLN: 40.0000 [IU] | Freq: Three times a day (TID) | SUBCUTANEOUS | Status: DC

## 2015-06-18 MED ORDER — INSULIN LISPRO 100 UNIT/ML ~~LOC~~ SOLN: 40.0000 [IU] | Freq: Three times a day (TID) | SUBCUTANEOUS | Status: DC

## 2015-06-18 MED ORDER — INSULIN ASPART 100 UNIT/ML ~~LOC~~ SOLN: SUBCUTANEOUS | Status: DC

## 2015-06-18 NOTE — Telephone Encounter (Signed)
I contacted the pt and the pharmacy. Pharmacy stated they needed a diagnosis code for the insulin to submit to the insurance. Rx re sent.. Pt advised of the rx being resubmitted and she would be received a phone call about a day and time for an appointment.

## 2015-06-18 NOTE — Telephone Encounter (Signed)
Gabriel Cirri from [place] called asking if patient could get a 90 day supply for medication Novalog. [phone] (Phone)  Please advise

## 2015-06-18 NOTE — Progress Notes (Signed)
Patient is here again to review how to fill a cartridge.  We wrote step by step instructions for this process for her which included the steps to prime and insert the infusion set for her as well as to fill the canula and steps to use for the pump.  She reported better understanding of this and said this lists would help her with this process.  She will do a change on her own on Saturday.   I suggested she me next week to review the check list as well as learn some other features of the pump, but did not want to rush this, as she is very slow in understanding all of this.   She is still uncertain with carb counting, but appears to be accurate with this the last 2 days.  Blood sugars are in the mid 100s, except when eating out-- when it goes over 200.  No changes were made to settings.   She had no final questions.

## 2015-06-18 NOTE — Telephone Encounter (Signed)
Patient called back and asked you to call her.

## 2015-06-18 NOTE — Patient Instructions (Signed)
Review video for cartridge and infusion set changes. Call 800 telephone number on back of pump if questions

## 2015-07-01 ENCOUNTER — Ambulatory Visit (INDEPENDENT_AMBULATORY_CARE_PROVIDER_SITE_OTHER): Payer: Medicare HMO | Admitting: Endocrinology

## 2015-07-01 ENCOUNTER — Encounter: Payer: Self-pay | Admitting: Endocrinology

## 2015-07-01 ENCOUNTER — Encounter: Payer: Medicare HMO | Admitting: Nutrition

## 2015-07-01 VITALS — BP 122/76 | HR 83 | Temp 98.3°F | Ht 63.0 in | Wt 116.0 lb

## 2015-07-01 DIAGNOSIS — E109 Type 1 diabetes mellitus without complications: Secondary | ICD-10-CM | POA: Diagnosis not present

## 2015-07-01 DIAGNOSIS — E119 Type 2 diabetes mellitus without complications: Secondary | ICD-10-CM

## 2015-07-01 LAB — POCT GLYCOSYLATED HEMOGLOBIN (HGB A1C): HEMOGLOBIN A1C: 7.8

## 2015-07-01 NOTE — Progress Notes (Signed)
Patient is changing cartridge and giving bolus with no help from me.  We reviewed the checklist and she signed off as understanding all topics.  She had no final questions and will call if questions develop. She made changes to her basal rate and carb ratio per Dr. Cordelia Pen instructions, with a little assistance from me.  New basal rate: 0.2u/hr.

## 2015-07-01 NOTE — Patient Instructions (Signed)
Continue to read manual as needed, and watch videos as needed. Continue to test blood sugars before meals and at bedtime.

## 2015-07-01 NOTE — Patient Instructions (Addendum)
Please: decrease basal rate to 0.2 units/hr increase mealtime bolus to 1 unit/ 40 grams carbohydrate, except please add 1 units to her calculated breakfast bolus.   continue correction bolus (which some people call "sensitivity," or "insulin sensitivity ratio," or just "isr") of 1 unit for each 100 by which your glucose exceeds 100.   Please come back for a follow-up appointment in 3 months.  check your blood sugar 4 times a day: before the 3 meals, and at bedtime.  also check if you have symptoms of your blood sugar being too high or too low.  please keep a record of the readings and bring it to your next appointment here (or you can bring the meter itself).  You can write it on any piece of paper.  please call us sooner if your blood sugar goes below 70, or if you have a lot of readings over 200.

## 2015-07-01 NOTE — Progress Notes (Signed)
Subjective:    Patient ID: Audrey [name], female    DOB: 1945-08-05, 70 y.o.   MRN: [id number]  HPI Pt returns for f/u of diabetes mellitus:  DM type: type 1 is dx'ed, based on lean body habitus and insulin sensitivity.   Dx'ed: [id number] Complications: none. Therapy: insulin since Jun 09, 2009.   GDM: never.   DKA: never. Severe hypoglycemia: never. Pancreatitis: never.  Other: she has been on pump rx (animas vibe) since early Jun 10, 2015.  Interval history: she has been on pump rx x 2 months.  She takes these settings:   continue basal rate of 0.3 units/hr, except for units/hr, 3 am to 6 am continue mealtime bolus of 1 unit/ 50 grams carbohydrate. continue correction bolus (which some people call "sensitivity," or "insulin sensitivity ratio," or just "isr") of 1 unit for each 100 by which your glucose exceeds 100.   Meter is downloaded today, and the results are transcribed onto a paper, which is scanned into the record.  It varies from 66to 300's.  It is lowest fasting, and highest at hs.  This happened in the fasting state.  She averages a total of approx units per day, via the pump.   Past Medical History  Diagnosis Date  . Diabetes mellitus ([place])   . Hyperlipidemia   . Seasonal allergies   . Osteopenia   . PONV (postoperative nausea and vomiting)   . Pericardial effusion     Past Surgical History  Procedure Laterality Date  . Right wrist fx    . Rectal polyps removed    . Subxyphoid pericardial window  02/18/2012    Procedure: SUBXYPHOID PERICARDIAL WINDOW;  Surgeon: Ivin Poot, MD;  Location: [facility] OR;  Service: Thoracic;  Laterality: N/A;  TEE    Social History   Social History  . Marital Status: Widowed    Spouse Name: died 06/10/11  . Number of Children: 1  . Years of Education: N/A   Occupational History  . computer/ desk top    Social History Main Topics  . Smoking status: Never Smoker   . Smokeless tobacco: Not on file  . Alcohol Use: No  . Drug Use: No  . Sexual  Activity: Not on file   Other Topics Concern  . Not on file   Social History Narrative    Current Outpatient Prescriptions on File Prior to Visit  Medication Sig Dispense Refill  . aspirin 81 MG tablet Take 81 mg by mouth daily.    . Calcium Carb-Cholecalciferol (CALTRATE 600+D) 600-800 MG-UNIT TABS Take by mouth.    . diphenhydrAMINE (BENADRYL) 25 mg capsule Take 25 mg by mouth every 6 (six) hours as needed.    Marland Kitchen glucose blood (FREESTYLE LITE) test strip USE TO CHECK GLUCOSE AFTER EACH MEAL AND AT BEDTIME (4 TIMES PER DAY) 150 each 6  . insulin aspart (NOVOLOG) 100 UNIT/ML injection 40 units per day per her insulin pump daily. Dx code: E11.8 40 mL 2  . pravastatin (PRAVACHOL) 40 MG tablet Take 40 mg by mouth daily.     No current facility-administered medications on file prior to visit.    Allergies  Allergen Reactions  . Januvia [Sitagliptin]     Family History  Problem Relation Age of Onset  . Heart attack Father   . Diabetes Brother   . Heart disease Brother   . Cancer Sister     ovarian    BP 122/76 mmHg  Pulse 83  Temp(Src) 98.3 F (36.8 C) (  Oral)  Ht 5\' 3"  (1.6 m)  Wt 116 lb (52.617 kg)  BMI 20.55 kg/m2  SpO2 96%  Review of Systems Denies LOC    Objective:   Physical Exam VITAL SIGNS:  See vs page GENERAL: no distress.   SKIN:  Insulin injection sites at the anterior abdomen are normal  A1c=7.8%    Assessment & Plan:  DM: Based on the pattern of her cbg's, she needs some adjustment in her therapy.    Patient is advised the following: Patient Instructions  Please: decrease basal rate to 0.2 units/hr increase mealtime bolus to 1 unit/ 40 grams carbohydrate, except please add 1 units to her calculated breakfast bolus.   continue correction bolus (which some people call "sensitivity," or "insulin sensitivity ratio," or just "isr") of 1 unit for each 100 by which your glucose exceeds 100.   Please come back for a follow-up appointment in 3 months.    check your blood sugar 4 times a day: before the 3 meals, and at bedtime.  also check if you have symptoms of your blood sugar being too high or too low.  please keep a record of the readings and bring it to your next appointment here (or you can bring the meter itself).  You can write it on any piece of paper.  please call us sooner if your blood sugar goes below 70, or if you have a lot of readings over 200.

## 2015-07-02 DIAGNOSIS — R69 Illness, unspecified: Secondary | ICD-10-CM | POA: Diagnosis not present

## 2015-07-31 DIAGNOSIS — E109 Type 1 diabetes mellitus without complications: Secondary | ICD-10-CM | POA: Diagnosis not present

## 2015-08-07 ENCOUNTER — Other Ambulatory Visit: Payer: Self-pay | Admitting: Endocrinology

## 2015-08-07 DIAGNOSIS — E109 Type 1 diabetes mellitus without complications: Secondary | ICD-10-CM

## 2015-10-01 ENCOUNTER — Ambulatory Visit: Payer: Medicare HMO | Admitting: Endocrinology

## 2015-10-02 ENCOUNTER — Encounter: Payer: Medicare HMO | Attending: Endocrinology | Admitting: Dietician

## 2015-10-02 ENCOUNTER — Ambulatory Visit (INDEPENDENT_AMBULATORY_CARE_PROVIDER_SITE_OTHER): Payer: Medicare HMO | Admitting: Endocrinology

## 2015-10-02 ENCOUNTER — Encounter: Payer: Self-pay | Admitting: Endocrinology

## 2015-10-02 ENCOUNTER — Encounter: Payer: Self-pay | Admitting: Dietician

## 2015-10-02 VITALS — BP 140/70 | Wt 115.8 lb

## 2015-10-02 DIAGNOSIS — M5442 Lumbago with sciatica, left side: Secondary | ICD-10-CM | POA: Diagnosis not present

## 2015-10-02 DIAGNOSIS — M5416 Radiculopathy, lumbar region: Secondary | ICD-10-CM | POA: Diagnosis not present

## 2015-10-02 DIAGNOSIS — E109 Type 1 diabetes mellitus without complications: Secondary | ICD-10-CM | POA: Diagnosis not present

## 2015-10-02 DIAGNOSIS — E138 Other specified diabetes mellitus with unspecified complications: Secondary | ICD-10-CM

## 2015-10-02 DIAGNOSIS — R768 Other specified abnormal immunological findings in serum: Secondary | ICD-10-CM | POA: Diagnosis not present

## 2015-10-02 DIAGNOSIS — E0800 Diabetes mellitus due to underlying condition with hyperosmolarity without nonketotic hyperglycemic-hyperosmolar coma (NKHHC): Secondary | ICD-10-CM

## 2015-10-02 LAB — POCT GLYCOSYLATED HEMOGLOBIN (HGB A1C): Hemoglobin A1C: 7.7

## 2015-10-02 NOTE — Patient Instructions (Addendum)
Plan:  Aim for 4 Carb Choices per meal (60 grams) +/- 1 either way  Aim for 0-2 Carbs per snack if hungry  Include protein in moderation with your meals and snacks Consider reading food labels for Total Carbohydrate and Fat Grams of foods Consider  increasing your activity level by walking for 30 minutes daily as tolerated Consider checking BG at alternate times per day as directed by MD  Consider taking medication as directed by MD  Continue to practice carbohydrate counting with each meal and snack.  Consider the resources such as Calorie [employer].com to find additional information about what you are eating.    Be sure to eat if you are hungry.  Above is just a guideline.  If you are hungry for more than 60 grams of carbs that can be fine but be sure to give the added insulin for them.   Please call if you have any questions.

## 2015-10-02 NOTE — Progress Notes (Signed)
Medical Nutrition Therapy:  Appt start time: 1100 end time:  1230.   Assessment:  Primary concerns today: Patient is here alone.  She would like to learn additional ideas for snacks and breakfast.  She will be traveling with her family on a bus trip soon and has concerns/questions regarding this.  Her A1  Was 7.7%  today, stable from 7.8% 07/01/15.  She uses an insulin pump since 2017 and counts carbohydrates although guesses at times.   She reports that she likes the pump OK and asks if it is ever OK to go back to taking shots if something were to happen with her health.  Her weight is 115 lbs which has been stable but with recommendations to gain a small amount of weight.  She has had type 2 diabetes since 2007 and on insulin since 2011. Other hx includes:  Hyperlipidemia.  She is having problems with sciatica currently and has just received a steroid shot for this.    Patient lives alone and does her shopping and cooking.  She is retired from [employer].  She enjoys helping an elderly neighbor with his garden.  She struggles being alone and has some mild depression at time.  Preferred Learning Style:   No preference indicated   Learning Readiness:   Ready  MEDICATIONS: see list   DIETARY INTAKE:  Usual eating pattern includes 3 meals and 1-2 snacks per day.   24-hr recall:  B (7AM): currently:  Rice Krispies, but prefers:  raisin bran, shredded wheat with skim milk and fruit Snk ( AM): none  L (12 PM): salad with protein and croutons and pie or zucchini bread or cottage cheese and fruit salad OR out to eat grilled chicken or sub Snk ( PM): none or fudge sickle or cheese D (5 PM): Chicken and dumplings or meatloaf or pork chops and vegetables, and starch (corn or potatoes) Snk (8 PM): homemade peanut butter crackers every night and fruit or cottage cheese and fruit Beverages: Usual physical activity: water with crystal lite, skim milk  Estimated energy needs: 1800 calories 200 g  carbohydrates 113 g protein 16 g fat  Progress Towards Goal(s):  In progress.   Nutritional Diagnosis:  NB-1.1 Food and nutrition-related knowledge deficit As related to carbohydrate counting.  As evidenced by patient report.    Intervention:  Nutrition counseling/education regarding carbohydrate counting by portion size using food models.  Discussed the rule of 15 for treating low blood sugar, tips for traveling, snack ideas, label reading and meal planning with focus on breakfast.    Plan:  Aim for 4 Carb Choices per meal (60 grams) +/- 1 either way  Aim for 0-2 Carbs per snack if hungry  Include protein in moderation with your meals and snacks Consider reading food labels for Total Carbohydrate and Fat Grams of foods Consider  increasing your activity level by walking for 30 minutes daily as tolerated Consider checking BG at alternate times per day as directed by MD  Consider taking medication as directed by MD  Continue to practice carbohydrate counting with each meal and snack.  Consider the resources such as Calorie [employer] Pace.com to find additional information about what you are eating.    Be sure to eat if you are hungry.  Above is just a guideline.  If you are hungry for more than 60 grams of carbs that can be fine but be sure to give the added insulin for them.   Please call if you have any questions.  Teaching Method Utilized:  Visual Auditory Hands on  Handouts given during visit include:  Meal plan card  Label reading  Snack list  Barriers to learning/adherence to lifestyle change: difficulty understanding at times  Demonstrated degree of understanding via:  Teach Back   Monitoring/Evaluation:  Dietary intake, exercise, label reading, and body weight prn.

## 2015-10-02 NOTE — Patient Instructions (Addendum)
Please take these settings: basal rate of 0.2 units/hr mealtime bolus to 1 unit/ 40 grams carbohydrate, except please add 1 unit to your calculated breakfast bolus.   correction bolus (which some people call "sensitivity," or "insulin sensitivity ratio," or just "isr") of 1 unit for each 100 by which your glucose exceeds 100.   Please come back for a follow-up appointment in 3 months.  Please see [name] the same day, to go over your pump settings.  check your blood sugar 4 times a day: before the 3 meals, and at bedtime.  also check if you have symptoms of your blood sugar being too high or too low.  please keep a record of the readings and bring it to your next appointment here (or you can bring the meter itself).  You can write it on any piece of paper.  please call us sooner if your blood sugar goes below 70, or if you have a lot of readings over 200.

## 2015-10-02 NOTE — Progress Notes (Signed)
Subjective:    Patient ID: Audrey [name], female    DOB: 04-08-1945, 70 y.o.   MRN: [id number]  HPI Pt returns for f/u of diabetes mellitus:  DM type: type 1 is dx'ed, based on lean body habitus and insulin sensitivity.   Dx'ed: [id number] Complications: none. Therapy: insulin since 2009/06/24.   GDM: never.   DKA: never. Severe hypoglycemia: never.  Pancreatitis: never.  Other: she has been on pump rx (animas vibe) since early 2015/06/25.  Interval history: she has been on pump rx since early 25-Jun-2015.  She takes these settings:   continue basal rate of 0.2 units/hr. continue mealtime bolus of 1 unit/ 40 grams carbohydrate.  She adds 1 unit to her calculated breakfast bolus. continue correction bolus (which some people call "sensitivity," or "insulin sensitivity ratio," or just "isr") of 1 unit for each 100 by which your glucose exceeds 100.   She averages a total of approx 20 units per day, via the pump. She seldom has hypoglycemia, and these episodes are mild. no cbg record, but states cbg's are lower when she has not eaten in many hours.  Otherwise, There is no trend throughout the day.  On further discussion, she does not know what her settings are.   Past Medical History:  Diagnosis Date  . Diabetes mellitus ([place])   . Hyperlipidemia   . Osteopenia   . Pericardial effusion   . PONV (postoperative nausea and vomiting)   . Seasonal allergies     Past Surgical History:  Procedure Laterality Date  . rectal polyps removed    . right wrist fx    . SUBXYPHOID PERICARDIAL WINDOW  02/18/2012   Procedure: SUBXYPHOID PERICARDIAL WINDOW;  Surgeon: Ivin Poot, MD;  Location: [facility];  Service: Thoracic;  Laterality: N/A;  TEE    Social History   Social History  . Marital status: Widowed    Spouse name: died 06/25/11  . Number of children: 1  . Years of education: N/A   Occupational History  . computer/ desk top    Social History Main Topics  . Smoking status: Never Smoker  . Smokeless tobacco:  Never Used  . Alcohol use No  . Drug use: No  . Sexual activity: Not on file   Other Topics Concern  . Not on file   Social History Narrative  . No narrative on file    Current Outpatient Prescriptions on File Prior to Visit  Medication Sig Dispense Refill  . aspirin 81 MG tablet Take 81 mg by mouth daily.    . Calcium Carb-Cholecalciferol (CALTRATE 600+D) 600-800 MG-UNIT TABS Take by mouth.    . diphenhydrAMINE (BENADRYL) 25 mg capsule Take 25 mg by mouth every 6 (six) hours as needed.    Marland Kitchen glucose blood (FREESTYLE LITE) test strip USE TO CHECK GLUCOSE AFTER EACH MEAL AND AT BEDTIME (4 TIMES PER DAY) 150 each 6  . insulin aspart (NOVOLOG) 100 UNIT/ML injection 40 units per day per her insulin pump daily. Dx code: E11.8 40 mL 2  . Insulin Infusion Pump Supplies (INSET INFUSION SET 23" 6MM) MISC 1 Device by Does not apply route every 3 (three) days.    . Insulin Infusion Pump Supplies (INSULIN PUMP SYRINGE RESERVOIR) MISC 1 Device by Does not apply route every 3 (three) days. Animas 2 ml    . pravastatin (PRAVACHOL) 40 MG tablet Take 40 mg by mouth daily.    . TURMERIC PO Take by mouth.  No current facility-administered medications on file prior to visit.     Allergies  Allergen Reactions  . Januvia [Sitagliptin]     Family History  Problem Relation Age of Onset  . Heart attack Father   . Diabetes Brother   . Heart disease Brother   . Cancer Sister     ovarian    BP 140/70   Wt 115 lb 12.8 oz (52.5 kg)   BMI 20.51 kg/m   Review of Systems Denies LOC.      Objective:   Physical Exam VITAL SIGNS:  See vs page GENERAL: no distress Pulses: dorsalis pedis intact bilat.   MSK: no deformity of the feet CV: no leg edema.   Skin:  no ulcer on the feet.  normal color and temp on the feet. Neuro: sensation is intact to touch on the feet.     Lab Results  Component Value Date   HGBA1C 7.7 10/02/2015      Assessment & Plan:  Type 1 DM: Based on the pattern of  her cbg's, she needs some adjustment in her therapy.  However, we cannot do so, as she does not know her settings.    Patient is advised the following: Patient Instructions  Please take these settings: basal rate of 0.2 units/hr mealtime bolus to 1 unit/ 40 grams carbohydrate, except please add 1 unit to your calculated breakfast bolus.   correction bolus (which some people call "sensitivity," or "insulin sensitivity ratio," or just "isr") of 1 unit for each 100 by which your glucose exceeds 100.   Please come back for a follow-up appointment in 3 months.  Please see [name] the same day, to go over your pump settings.  check your blood sugar 4 times a day: before the 3 meals, and at bedtime.  also check if you have symptoms of your blood sugar being too high or too low.  please keep a record of the readings and bring it to your next appointment here (or you can bring the meter itself).  You can write it on any piece of paper.  please call us sooner if your blood sugar goes below 70, or if you have a lot of readings over 200.    Renato Shin, MD

## 2015-10-08 ENCOUNTER — Ambulatory Visit: Payer: Medicare HMO | Attending: Rheumatology | Admitting: Physical Therapy

## 2015-10-08 DIAGNOSIS — M5442 Lumbago with sciatica, left side: Secondary | ICD-10-CM | POA: Diagnosis not present

## 2015-10-08 NOTE — Therapy (Signed)
[facility] [place], [place], [address] Phone: [phone]   Fax:  [phone]  Physical Therapy Evaluation  Patient Details  Name: Audrey [name] MRN: [id number] Date of Birth: Apr 30, 1945 Referring Provider: Gavin Pound MD  Encounter Date: 10/08/2015      PT End of Session - 10/08/15 1505    Visit Number 1   Number of Visits 16   Date for PT Re-Evaluation 12/07/15   PT Start Time 1032   PT Stop Time 1123   PT Time Calculation (min) 51 min   Activity Tolerance Patient tolerated treatment well   Behavior During Therapy [facility] for tasks assessed/performed      Past Medical History:  Diagnosis Date  . Diabetes mellitus ([place])   . Hyperlipidemia   . Osteopenia   . Pericardial effusion   . PONV (postoperative nausea and vomiting)   . Seasonal allergies     Past Surgical History:  Procedure Laterality Date  . rectal polyps removed    . right wrist fx    . SUBXYPHOID PERICARDIAL WINDOW  02/18/2012   Procedure: SUBXYPHOID PERICARDIAL WINDOW;  Surgeon: Ivin Poot, MD;  Location: [facility];  Service: Thoracic;  Laterality: N/A;  TEE    There were no vitals filed for this visit.       Subjective Assessment - 10/08/15 1500    Subjective I started feeling pain in my left low back and now it goes all the way down to my left foot.   How long can you sit comfortably? 15 minutes.   How long can you stand comfortably? 15 minutes.   Diagnostic tests X-ray of spine "showed bone on bone."   Patient Stated Goals Get rid of this pain.   Currently in Pain? Yes   Pain Score 5    Pain Location Leg   Pain Orientation Left   Pain Descriptors / Indicators Aching;Throbbing   Pain Type Acute pain   Pain Onset More than a month ago   Pain Frequency Constant   Aggravating Factors  Prolonged sitting and standing.   Pain Relieving Factors Rest.            [facility] PT Assessment - 10/08/15 0001      Assessment   Medical  Diagnosis Lumbago with sciatica, left side.   Referring Provider Gavin Pound MD   Onset Date/Surgical Date --  Several weeks.     Precautions   Precautions None     Restrictions   Weight Bearing Restrictions No     Balance Screen   Has the patient fallen in the past 6 months No   Has the patient had a decrease in activity level because of a fear of falling?  No   Is the patient reluctant to leave their home because of a fear of falling?  No     Home Environment   Living Environment Private residence     Prior Function   Level of Independence Independent     Posture/Postural Control   Posture/Postural Control Postural limitations   Postural Limitations Rounded Shoulders;Forward head     ROM / Strength   AROM / PROM / Strength AROM;Strength     AROM   Overall AROM Comments Active intervertebral flexion limited actively by 40% and active lumbar extension= 20 degrees.     Strength   Overall Strength Comments Normal bilateral LE strength.     Palpation   Palpation comment Tender to palpation at L5-S1 and lower left  lumbar region.  Patient also experiences radiation of pain bilaterally from the L5-S1 region.     Special Tests    Special Tests Lumbar;Leg LengthTest   Lumbar Tests --  (-) SLR testing.  Normal bilateral LE DTR's.   Leg length test  --  Equal leg lengths.     Ambulation/Gait   Gait Comments WNL.                   [facility] Adult PT Treatment/Exercise - 10/08/15 0001      Modalities   Modalities Electrical Stimulation;Moist Heat     Moist Heat Therapy   Number Minutes Moist Heat 15 Minutes   Moist Heat Location --  Low back.     Acupuncturist Location --  Left low back.   Electrical Stimulation Action --  Constant Pre-mod e'stim.   Electrical Stimulation Parameters 80-150 Hz x 15 minutes.   Electrical Stimulation Goals Pain                PT Education - 10/08/15 1504    Education provided  Yes   Person(s) Educated Patient   Methods Explanation;Demonstration;Tactile cues;Verbal cues;Handout   Comprehension Verbalized understanding;Returned demonstration          PT Short Term Goals - 10/08/15 1512      PT SHORT TERM GOAL #1   Title Ind with an initial HEP.   Time 2   Period Weeks   Status New           PT Long Term Goals - 10/08/15 1513      PT LONG TERM GOAL #1   Title Independent with a final HEP.   Time 8   Period Weeks   Status New     PT LONG TERM GOAL #2   Title Eliminate left LE pain/symptoms.   Time 8   Period Weeks   Status New     PT LONG TERM GOAL #3   Title Sit 30 minutes with pain not > 3/10.   Time 8   Period Weeks   Status New     PT LONG TERM GOAL #4   Title Stand 30 minutes with pain not > 3/10.   Time 8   Period Weeks   Status New     PT LONG TERM GOAL #5   Title Perform ADL's with pain not > 3/10.   Time 8   Period Weeks   Status New               Plan - 10/08/15 1505    Clinical Impression Statement The patient reports that over the last several months she has been experiencing left low back and his progressed/evolved to moving down her right LE to her left foot.  Prolonged sitting and standing increase her pain to higher level (7+/10).  She states an X-ray showed "bone on bone" in her low back.  She demonstrates a decrease in vertebral movement moving into active flexion.  Bilateral LE DTR's are intact and she demonstrates negative SLR testing.   Rehab Potential Good   Clinical Impairments Affecting Rehab Potential Osteopenia and DM with insulin pump.   PT Frequency 2x / week   PT Duration 8 weeks   PT Treatment/Interventions ADLs/Self Care Home Management;Cryotherapy;Electrical Stimulation;Ultrasound;Moist Heat;Therapeutic exercise;Therapeutic activities;Patient/family education;Manual techniques   PT Next Visit Plan Positional traction (right sidelying position);  NO mechanical traction; Modalites and STW/M to  left low back region; core exercise progression.  Consulted and Agree with Plan of Care Patient      Patient will benefit from skilled therapeutic intervention in order to improve the following deficits and impairments:  Pain, Decreased activity tolerance, Decreased range of motion  Visit Diagnosis: Lumbago with sciatica, left side - Plan: PT plan of care cert/re-cert      G-Codes - 123XX123 [id number]    Functional Assessment Tool Used Clinical judgement.   Functional Limitation Mobility: Walking and moving around   Mobility: Walking and Moving Around Current Status [phone]) At least 40 percent but less than 60 percent impaired, limited or restricted   Mobility: Walking and Moving Around Goal Status [phone]) At least 1 percent but less than 20 percent impaired, limited or restricted       Problem List Patient Active Problem List   Diagnosis Date Noted  . Diabetes ([place]) 05/08/2015  . Left elbow pain 11/15/2013  . S/P pericardial surgery 02/20/2012  . Second degree AV block, Mobitz type I 02/18/2012  . Pericardial effusion 02/16/2012  . Seasonal allergies   . Osteopenia     Marshayla Mitschke, [place] MPT 10/08/2015, 3:24 PM  [facility] [address] [place], [place], [address] Phone: [phone]   Fax:  [phone]  Name: Audrey [name] MRN: [id number] Date of Birth: [birth year]

## 2015-10-09 ENCOUNTER — Encounter: Payer: Self-pay | Admitting: Physical Therapy

## 2015-10-09 ENCOUNTER — Ambulatory Visit: Payer: Medicare HMO | Admitting: Physical Therapy

## 2015-10-09 DIAGNOSIS — M5442 Lumbago with sciatica, left side: Secondary | ICD-10-CM

## 2015-10-09 NOTE — Therapy (Signed)
[facility], [place], [address] Phone: [phone]   Fax:  [phone]  Physical Therapy Treatment  Patient Details  Name: Audrey [name] MRN: [id number] Date of Birth: 14-May-1945 Referring Provider: Gavin Pound MD  Encounter Date: 10/09/2015      PT End of Session - 10/09/15 1048    Visit Number 2   Number of Visits 16   Date for PT Re-Evaluation 12/07/15   PT Start Time 1031   PT Stop Time 1113   PT Time Calculation (min) 42 min   Activity Tolerance Patient tolerated treatment well   Behavior During Therapy [facility] for tasks assessed/performed      Past Medical History:  Diagnosis Date  . Diabetes mellitus ([place])   . Hyperlipidemia   . Osteopenia   . Pericardial effusion   . PONV (postoperative nausea and vomiting)   . Seasonal allergies     Past Surgical History:  Procedure Laterality Date  . rectal polyps removed    . right wrist fx    . SUBXYPHOID PERICARDIAL WINDOW  02/18/2012   Procedure: SUBXYPHOID PERICARDIAL WINDOW;  Surgeon: Ivin Poot, MD;  Location: [facility];  Service: Thoracic;  Laterality: N/A;  TEE    There were no vitals filed for this visit.      Subjective Assessment - 10/09/15 1036    Subjective Patient reported some soreness from getting up at 4am and cleaning and vaccuming house   How long can you sit comfortably? 15 minutes.   How long can you stand comfortably? 15 minutes.   Diagnostic tests X-ray of spine "showed bone on bone."   Patient Stated Goals Get rid of this pain.   Currently in Pain? Yes   Pain Score 5    Pain Location Back   Pain Orientation Left   Pain Descriptors / Indicators Aching;Throbbing   Pain Type Acute pain   Pain Radiating Towards left LE calf and foot   Pain Onset More than a month ago   Pain Frequency Constant   Aggravating Factors  prolong activity   Pain Relieving Factors rest                         [facility] Adult PT  Treatment/Exercise - 10/09/15 0001      Self-Care   Self-Care ADL's;Lifting;Posture   ADL's cleaning and techniques with house and yard work   Lifting in all positions   Posture in all positions     Exercises   Exercises Lumbar     Lumbar Exercises: Sidelying   Other Sidelying Lumbar Exercises positional traction x80mn     Moist Heat Therapy   Number Minutes Moist Heat 15 Minutes   Moist Heat Location Lumbar Spine     Electrical Stimulation   Electrical Stimulation Location left low back   Electrical Stimulation Action premod    Electrical Stimulation Parameters 80-150hz    Electrical Stimulation Goals Pain     Manual Therapy   Manual Therapy Soft tissue mobilization;Myofascial release   Myofascial Release manual and IASTM to left low back paraspinals while patient is sidelying on positional traction                PT Education - 10/09/15 1107    Education provided Yes   Education Details HEP draw in/ADL's posture   Person(s) Educated Patient   Methods Explanation;Demonstration;Handout   Comprehension Verbalized understanding;Returned demonstration  PT Short Term Goals - 10/09/15 1107      PT SHORT TERM GOAL #1   Title Ind with an initial HEP.   Time 2   Period Weeks   Status Achieved           PT Long Term Goals - 10/09/15 1107      PT LONG TERM GOAL #1   Title Independent with a final HEP.   Time 8   Period Weeks   Status On-going     PT LONG TERM GOAL #2   Title Eliminate left LE pain/symptoms.   Time 8   Period Weeks   Status On-going     PT LONG TERM GOAL #3   Title Sit 30 minutes with pain not > 3/10.   Time 8   Period Weeks   Status On-going     PT LONG TERM GOAL #4   Title Stand 30 minutes with pain not > 3/10.   Time 8   Period Weeks   Status On-going     PT LONG TERM GOAL #5   Title Perform ADL's with pain not > 3/10.   Time 8   Period Weeks   Status On-going               Plan - 10/09/15 1108     Clinical Impression Statement Patient progressing slowly due to flare up from morning house work. Patient tolerated treatment well and has less pain post treatment today. Educated patint on posture awareness techniques in all positions and with ADL's to protect back. HEP given fore core activation to stabilize and strengthen back with posture techniques. Patient understands importance of techniques. Patient met STG #1 today and other goals ongoing due to pain deficits and LE symptoms.   Rehab Potential Good   Clinical Impairments Affecting Rehab Potential Osteopenia and DM with insulin pump.   PT Frequency 2x / week   PT Duration 8 weeks   PT Treatment/Interventions ADLs/Self Care Home Management;Cryotherapy;Electrical Stimulation;Ultrasound;Moist Heat;Therapeutic exercise;Therapeutic activities;Patient/family education;Manual techniques   PT Next Visit Plan Positional traction (right sidelying position);  NO mechanical traction; Modalites and STW/M to left low back region; core exercise progression.   Consulted and Agree with Plan of Care Patient      Patient will benefit from skilled therapeutic intervention in order to improve the following deficits and impairments:  Pain, Decreased activity tolerance, Decreased range of motion  Visit Diagnosis: Lumbago with sciatica, left side       G-Codes - 11-03-15 1512    Functional Assessment Tool Used Clinical judgement.   Functional Limitation Mobility: Walking and moving around   Mobility: Walking and Moving Around Current Status [phone]) At least 40 percent but less than 60 percent impaired, limited or restricted   Mobility: Walking and Moving Around Goal Status [phone]) At least 1 percent but less than 20 percent impaired, limited or restricted      Problem List Patient Active Problem List   Diagnosis Date Noted  . Diabetes ([place]) 05/08/2015  . Left elbow pain 11/15/2013  . S/P pericardial surgery 02/20/2012  . Second degree AV block,  Mobitz type I 02/18/2012  . Pericardial effusion 02/16/2012  . Seasonal allergies   . Osteopenia     Kaiyden Simkin P, PTA 10/09/2015, 11:21 AM  [facility] [place], [place], [address] Phone: [phone]   Fax:  [phone]  Name: DAMETRIA TUZZOLINO MRN: [id number] Date of Birth: 01-Jul-1945

## 2015-10-09 NOTE — Patient Instructions (Signed)
Pelvic Tilt: Posterior - Legs Bent (Supine)   Tighten stomach and flatten back by rolling pelvis down. Hold _10___ seconds. Relax. Repeat _10-30___ times per set. Do __2__ sets per session. Do _2___ sessions per day.        Brushing Teeth    Place one foot on ledge and one hand on counter. Bend other knee slightly to keep back straight.  Copyright  VHI. All rights reserved.  Refrigerator   Squat with knees apart to reach lower shelves and drawers.   Copyright  VHI. All rights reserved.  Laundry [employer] down and hold basket close to stand. Use leg muscles to do the work.   Copyright  VHI. All rights reserved.  Housework - Vacuuming   Hold the vacuum with arm held at side. Step back and forth to move it, keeping head up. Avoid twisting.   Copyright  VHI. All rights reserved.  Housework - Wiping   Position yourself as close as possible to reach work surface. Avoid straining your back.   Copyright  VHI. All rights reserved.  Gardening - Mowing   Keep arms close to sides and walk with lawn mower.   Copyright  VHI. All rights reserved.  Sleeping on Side   Place pillow between knees. Use cervical support under neck and a roll around waist as needed.   Copyright  VHI. All rights reserved.  Log Roll   Lying on back, bend left knee and place left arm across chest. Roll all in one movement to the right. Reverse to roll to the left. Always move as one unit.   Copyright  VHI. All rights reserved.  Stand to Sit / Sit to Stand   To sit: Bend knees to lower self onto front edge of chair, then scoot back on seat. To stand: Reverse sequence by placing one foot forward, and scoot to front of seat. Use rocking motion to stand up.  Copyright  VHI. All rights reserved.  Posture - Standing   Good posture is important. Avoid slouching and forward head thrust. Maintain curve in low back and align ears over shoul- ders, hips over  ankles.   Copyright  VHI. All rights reserved.  Posture - Sitting   Sit upright, head facing forward. Try using a roll to support lower back. Keep shoulders relaxed, and avoid rounded back. Keep hips level with knees. Avoid crossing legs for long periods.

## 2015-10-15 ENCOUNTER — Ambulatory Visit: Payer: Medicare HMO | Admitting: Physical Therapy

## 2015-10-15 ENCOUNTER — Encounter: Payer: Self-pay | Admitting: Physical Therapy

## 2015-10-15 DIAGNOSIS — M5442 Lumbago with sciatica, left side: Secondary | ICD-10-CM | POA: Diagnosis not present

## 2015-10-15 NOTE — Therapy (Signed)
[facility] [place], [place], [address] Phone: [phone]   Fax:  [phone]  Physical Therapy Treatment  Patient Details  Name: Audrey [name] MRN: [id number] Date of Birth: [birth year] Referring Provider: Gavin Pound MD  Encounter Date: 10/15/2015      PT End of Session - 10/15/15 1043    Visit Number 3   Number of Visits 16   Date for PT Re-Evaluation 12/07/15   PT Start Time 1030   PT Stop Time 1111   PT Time Calculation (min) 41 min   Activity Tolerance Patient tolerated treatment well;Patient limited by pain   Behavior During Therapy [facility] for tasks assessed/performed      Past Medical History:  Diagnosis Date  . Diabetes mellitus ([place])   . Hyperlipidemia   . Osteopenia   . Pericardial effusion   . PONV (postoperative nausea and vomiting)   . Seasonal allergies     Past Surgical History:  Procedure Laterality Date  . rectal polyps removed    . right wrist fx    . SUBXYPHOID PERICARDIAL WINDOW  02/18/2012   Procedure: SUBXYPHOID PERICARDIAL WINDOW;  Surgeon: Ivin Poot, MD;  Location: [facility];  Service: Thoracic;  Laterality: N/A;  TEE    There were no vitals filed for this visit.      Subjective Assessment - 10/15/15 1035    Subjective Patient reported feeling better after last treatment, yet this AM left knee is sore and feels like is going to give way   How long can you sit comfortably? 15 minutes.   How long can you stand comfortably? 15 minutes.   Diagnostic tests X-ray of spine "showed bone on bone."   Patient Stated Goals Get rid of this pain.   Currently in Pain? Yes   Pain Score 10-Worst pain ever   Pain Location Back  left knee   Pain Orientation Left   Pain Descriptors / Indicators Sore   Pain Type Acute pain   Pain Radiating Towards left knee   Pain Onset More than a month ago   Pain Frequency Constant   Aggravating Factors  standing or certain movement   Pain Relieving  Factors rest                         [facility] Adult PT Treatment/Exercise - 10/15/15 0001      Lumbar Exercises: Sidelying   Other Sidelying Lumbar Exercises positional traction x41min     Moist Heat Therapy   Number Minutes Moist Heat 15 Minutes   Moist Heat Location Lumbar Spine     Electrical Stimulation   Electrical Stimulation Location left low back   Electrical Stimulation Action premod   Electrical Stimulation Parameters 80-150hz    Electrical Stimulation Goals Pain     Manual Therapy   Manual Therapy Soft tissue mobilization;Myofascial release   Myofascial Release manual and IASTM to left low back paraspinals while patient is sidelying on positional traction                  PT Short Term Goals - 10/09/15 1107      PT SHORT TERM GOAL #1   Title Ind with an initial HEP.   Time 2   Period Weeks   Status Achieved           PT Long Term Goals - 10/09/15 1107      PT LONG TERM GOAL #1   Title Independent with  a final HEP.   Time 8   Period Weeks   Status On-going     PT LONG TERM GOAL #2   Title Eliminate left LE pain/symptoms.   Time 8   Period Weeks   Status On-going     PT LONG TERM GOAL #3   Title Sit 30 minutes with pain not > 3/10.   Time 8   Period Weeks   Status On-going     PT LONG TERM GOAL #4   Title Stand 30 minutes with pain not > 3/10.   Time 8   Period Weeks   Status On-going     PT LONG TERM GOAL #5   Title Perform ADL's with pain not > 3/10.   Time 8   Period Weeks   Status On-going               Plan - 10/15/15 1048    Clinical Impression Statement Patient tolerated treatment well yet limited due to pain with standing and transitional movements. Patient arrived today with pain mostly in left knee today for unknow reason. Patient did report finishing her prednisone pills yesterday. Patient had relief with positional traction and STW today. Patient unable to meet any further goals due to pain  deficit.Educated patient on continued posture and core activation to strengthen and protect back.   Rehab Potential Good   Clinical Impairments Affecting Rehab Potential Osteopenia and DM with insulin pump.   PT Frequency 2x / week   PT Duration 8 weeks   PT Treatment/Interventions ADLs/Self Care Home Management;Cryotherapy;Electrical Stimulation;Ultrasound;Moist Heat;Therapeutic exercise;Therapeutic activities;Patient/family education;Manual techniques   PT Next Visit Plan Positional traction (right sidelying position);  NO mechanical traction; Modalites and STW/M to left low back region; core exercise progression.   Consulted and Agree with Plan of Care Patient      Patient will benefit from skilled therapeutic intervention in order to improve the following deficits and impairments:  Pain, Decreased activity tolerance, Decreased range of motion  Visit Diagnosis: Lumbago with sciatica, left side     Problem List Patient Active Problem List   Diagnosis Date Noted  . Diabetes ([place]) 05/08/2015  . Left elbow pain 11/15/2013  . S/P pericardial surgery 02/20/2012  . Second degree AV block, Mobitz type I 02/18/2012  . Pericardial effusion 02/16/2012  . Seasonal allergies   . Osteopenia     DUNFORD, CHRISTINA P, PTA 10/15/2015, 11:24 AM  [facility] [place], [place], [address] Phone: [phone]   Fax:  [phone]  Name: Audrey [name] MRN: [id number] Date of Birth: 07/12/1945

## 2015-10-16 ENCOUNTER — Encounter: Payer: Medicare HMO | Admitting: Physical Therapy

## 2015-10-16 DIAGNOSIS — M25562 Pain in left knee: Secondary | ICD-10-CM | POA: Diagnosis not present

## 2015-10-22 ENCOUNTER — Encounter: Payer: Medicare HMO | Attending: Endocrinology | Admitting: Nutrition

## 2015-10-22 ENCOUNTER — Telehealth: Payer: Self-pay | Admitting: Nutrition

## 2015-10-22 ENCOUNTER — Other Ambulatory Visit: Payer: Self-pay | Admitting: Endocrinology

## 2015-10-22 DIAGNOSIS — E109 Type 1 diabetes mellitus without complications: Secondary | ICD-10-CM | POA: Diagnosis not present

## 2015-10-22 DIAGNOSIS — E119 Type 2 diabetes mellitus without complications: Secondary | ICD-10-CM

## 2015-10-22 DIAGNOSIS — M25562 Pain in left knee: Secondary | ICD-10-CM | POA: Diagnosis not present

## 2015-10-22 DIAGNOSIS — E0800 Diabetes mellitus due to underlying condition with hyperosmolarity without nonketotic hyperglycemic-hyperosmolar coma (NKHHC): Secondary | ICD-10-CM

## 2015-10-22 NOTE — Telephone Encounter (Signed)
As she is doen with steroids, please ask pt to double her correction bolus to 1 unit for each 50 by which cbg exceeds 100, for the next week.  Call in 2 days if not better.

## 2015-10-22 NOTE — Assessment & Plan Note (Signed)
Patient says that when she does a correction bolus, the target says -100.  She thinks it should say 100.   I explained to her that the pump is subtracting 100 points to calculate how many points she will need for her blood sugar to drop, so that the pump can calculate her insulin correction dose.   She reported good understanding of this. She has  Been on a course of Prednisone for 2 weeks and finished this 1 week ago.  She had been doing correction boluses every 4 hours.   They are now down into the mid 200s all of the time.  She wants to know what to do. I sent Dr. Loanne Drilling a telephone encounter for this.   She had no final questions.

## 2015-10-22 NOTE — Telephone Encounter (Addendum)
This patient is here in my office for a pump problem and high blood sugars.  She had cortisone medication for hip problem that lasted for 12 days.  She has been off the prednisone for 8 days, but blood sugars are still in the mid 200s--225-269.    Current basal rate is 0.20

## 2015-10-24 ENCOUNTER — Telehealth: Payer: Self-pay | Admitting: Nutrition

## 2015-10-24 NOTE — Telephone Encounter (Signed)
Pt. Reported that since we changed her ISF Wednesday evening, her blood sugars yesterday were: FBS: 225, acL: 211, acS: 281, HS: 231,  Her FBS today was 135.

## 2015-10-24 NOTE — Telephone Encounter (Signed)
I contacted the pt and advised of instructions. Pt voiced understanding.

## 2015-10-24 NOTE — Telephone Encounter (Signed)
Ok, please increase the ISF to 1 unit for each 30 by which your blood sugar exceeds 100. By next week, it is anticipated that you will be able to go back to 1 unit for each 100 over 100.

## 2015-10-24 NOTE — Telephone Encounter (Signed)
Talked with patient and talked her into making the changes to her ISF from 100 to 50.

## 2015-10-27 ENCOUNTER — Telehealth: Payer: Self-pay | Admitting: Nutrition

## 2015-10-27 DIAGNOSIS — M25562 Pain in left knee: Secondary | ICD-10-CM | POA: Diagnosis not present

## 2015-10-27 NOTE — Telephone Encounter (Signed)
Pt scheduled for tomorrow at 230 pm.

## 2015-10-27 NOTE — Telephone Encounter (Signed)
Patient called saying that she is needing to have surgery on her hip/leg, and has left a piece of paper for Dr. Loanne Drilling to sign and fax to her othopedic, so that she can have this done this Wednesday.

## 2015-10-27 NOTE — Telephone Encounter (Signed)
Ov tomorrow 9 AM 

## 2015-10-27 NOTE — Telephone Encounter (Signed)
To be advised.  

## 2015-10-28 ENCOUNTER — Encounter: Payer: Self-pay | Admitting: Endocrinology

## 2015-10-28 ENCOUNTER — Ambulatory Visit (INDEPENDENT_AMBULATORY_CARE_PROVIDER_SITE_OTHER): Payer: Medicare HMO | Admitting: Endocrinology

## 2015-10-28 VITALS — BP 132/70 | HR 96 | Ht 63.0 in | Wt 114.0 lb

## 2015-10-28 DIAGNOSIS — E0800 Diabetes mellitus due to underlying condition with hyperosmolarity without nonketotic hyperglycemic-hyperosmolar coma (NKHHC): Secondary | ICD-10-CM

## 2015-10-28 NOTE — Progress Notes (Signed)
Subjective:    Patient ID: Audrey [name], female    DOB: 1945-09-18, 70 y.o.   MRN: [id number]  HPI Pt returns for f/u of diabetes mellitus:  DM type: type 1 is dx'ed, based on lean body habitus and insulin sensitivity.   Dx'ed: [id number] Complications: none. Therapy: insulin since 06-23-09.   GDM: never.   DKA: never. Severe hypoglycemia: never.  Pancreatitis: never.  Other: she has been on pump rx (animas vibe) since early 2015/06/24.  Interval history: she has been on pump rx since early 06/24/15.  She takes these settings:   basal rate of 0.2 units/hr. mealtime bolus of 1 unit/ 40 grams carbohydrate.  She adds 1 unit to her calculated breakfast bolus. correction bolus (which some people call "sensitivity," or "insulin sensitivity ratio," or just "isr") of 1 unit for each 100 by which your glucose exceeds 100.   She finished prednisone 2 weeks ago.  She had to increase her correction bolus, due to the prednisone.  However, she says cbg's are back to the low-100's now.  She seldom has hypoglycemia, and these episodes are mild.  She will have left knee arthroscopy tomorrow morning.  Past Medical History:  Diagnosis Date  . Diabetes mellitus ([place])   . Hyperlipidemia   . Osteopenia   . Pericardial effusion   . PONV (postoperative nausea and vomiting)   . Seasonal allergies     Past Surgical History:  Procedure Laterality Date  . rectal polyps removed    . right wrist fx    . SUBXYPHOID PERICARDIAL WINDOW  02/18/2012   Procedure: SUBXYPHOID PERICARDIAL WINDOW;  Surgeon: Ivin Poot, MD;  Location: [facility];  Service: Thoracic;  Laterality: N/A;  TEE    Social History   Social History  . Marital status: Widowed    Spouse name: died Jun 24, 2011  . Number of children: 1  . Years of education: N/A   Occupational History  . computer/ desk top    Social History Main Topics  . Smoking status: Never Smoker  . Smokeless tobacco: Never Used  . Alcohol use No  . Drug use: No  . Sexual activity:  Not on file   Other Topics Concern  . Not on file   Social History Narrative  . No narrative on file    Current Outpatient Prescriptions on File Prior to Visit  Medication Sig Dispense Refill  . aspirin 81 MG tablet Take 81 mg by mouth daily.    . Calcium Carb-Cholecalciferol (CALTRATE 600+D) 600-800 MG-UNIT TABS Take by mouth.    . diphenhydrAMINE (BENADRYL) 25 mg capsule Take 25 mg by mouth every 6 (six) hours as needed.    Marland Kitchen glucose blood (FREESTYLE LITE) test strip USE TO CHECK GLUCOSE AFTER EACH MEAL AND AT BEDTIME (4 TIMES PER DAY) 150 each 6  . insulin aspart (NOVOLOG) 100 UNIT/ML injection 40 units per day per her insulin pump daily. Dx code: E11.8 40 mL 2  . Insulin Infusion Pump Supplies (INSET INFUSION SET 23" 6MM) MISC 1 Device by Does not apply route every 3 (three) days.    . Insulin Infusion Pump Supplies (INSULIN PUMP SYRINGE RESERVOIR) MISC 1 Device by Does not apply route every 3 (three) days. Animas 2 ml    . pravastatin (PRAVACHOL) 40 MG tablet Take 40 mg by mouth daily.    . TURMERIC PO Take by mouth.     No current facility-administered medications on file prior to visit.     Allergies  Allergen Reactions  . Januvia [Sitagliptin]     Family History  Problem Relation Age of Onset  . Heart attack Father   . Diabetes Brother   . Heart disease Brother   . Cancer Sister     ovarian    BP 132/70   Pulse 96   Ht 5\' 3"  (1.6 m)   Wt 114 lb (51.7 kg)   SpO2 97%   BMI 20.19 kg/m   Review of Systems Denies weight change.      Objective:   Physical Exam VITAL SIGNS:  See vs page GENERAL: no distress Pulses: dorsalis pedis intact bilat.  MSK: no deformity of the feet CV: no leg edema Skin:  no ulcer on the feet.  normal color and temp on the feet.  Neuro: sensation is intact to touch on the feet.      Assessment & Plan:  Type 1 DM: glycemic control is improved, after course of prednisone Knee pain: new to me.  She'll need special perioperative  insulin pump instructions

## 2015-10-28 NOTE — Patient Instructions (Addendum)
Please take these settings: basal rate of 0.2 units/hr mealtime bolus to 1 unit/ 40 grams carbohydrate, except please add 1 unit to your calculated breakfast bolus.   correction bolus (which some people call "sensitivity," or "insulin sensitivity ratio," or just "isr") of 1 unit for each 100 by which your glucose exceeds 100.   check your blood sugar 4 times a day: before the 3 meals, and at bedtime.  also check if you have symptoms of your blood sugar being too high or too low.  please keep a record of the readings and bring it to your next appointment here (or you can bring the meter itself).  You can write it on any piece of paper.  please call us sooner if your blood sugar goes below 70, or if you have a lot of readings over 200.   For your surgery tomorrow: At midnight tonight, reduce the basal rate to 0.1 unit/hr. Skip the breakfast bolus, as you will miss breakfast For other meals tomorrow, take your usual bolus thurs morning, please increase basal rate back to 0.2 units.hr If necessary, take a small amount of sugar by mouth.   Please come back for a follow-up appointment in 2 months.

## 2015-10-29 DIAGNOSIS — M94262 Chondromalacia, left knee: Secondary | ICD-10-CM | POA: Diagnosis not present

## 2015-10-29 DIAGNOSIS — G8918 Other acute postprocedural pain: Secondary | ICD-10-CM | POA: Diagnosis not present

## 2015-10-29 DIAGNOSIS — S83242A Other tear of medial meniscus, current injury, left knee, initial encounter: Secondary | ICD-10-CM | POA: Diagnosis not present

## 2015-10-31 ENCOUNTER — Telehealth: Payer: Self-pay | Admitting: Endocrinology

## 2015-10-31 NOTE — Telephone Encounter (Signed)
PT requests call back about her basal rates.

## 2015-10-31 NOTE — Telephone Encounter (Signed)
I contacted the pt and advised per Dr. Loanne Drilling instructions from 10/28/2015. Pt advised after her surgery to put the pump basal rate back to 0.2 units. Pt voiced understanding on this number and had no further questions.

## 2015-11-06 DIAGNOSIS — S83242D Other tear of medial meniscus, current injury, left knee, subsequent encounter: Secondary | ICD-10-CM | POA: Diagnosis not present

## 2015-11-11 ENCOUNTER — Ambulatory Visit: Payer: Medicare HMO | Attending: Orthopedic Surgery | Admitting: Physical Therapy

## 2015-11-11 DIAGNOSIS — M25662 Stiffness of left knee, not elsewhere classified: Secondary | ICD-10-CM | POA: Insufficient documentation

## 2015-11-11 DIAGNOSIS — R600 Localized edema: Secondary | ICD-10-CM

## 2015-11-11 DIAGNOSIS — M25562 Pain in left knee: Secondary | ICD-10-CM | POA: Diagnosis not present

## 2015-11-11 NOTE — Therapy (Signed)
[facility] [place], [place], [address] Phone: [phone]   Fax:  [phone]  Physical Therapy Evaluation  Patient Details  Name: Audrey [name] MRN: [id number] Date of Birth: April 19, 1945 Referring Provider: Earlie Server MD.  Encounter Date: 11/11/2015      PT End of Session - 11/11/15 1150    Visit Number 1   Number of Visits 16   Date for PT Re-Evaluation 01/09/16   PT Start Time 1115   PT Stop Time 1207   PT Time Calculation (min) 52 min   Activity Tolerance Patient tolerated treatment well;Patient limited by pain   Behavior During Therapy [facility] for tasks assessed/performed      Past Medical History:  Diagnosis Date  . Diabetes mellitus ([place])   . Hyperlipidemia   . Osteopenia   . Pericardial effusion   . PONV (postoperative nausea and vomiting)   . Seasonal allergies     Past Surgical History:  Procedure Laterality Date  . rectal polyps removed    . right wrist fx    . SUBXYPHOID PERICARDIAL WINDOW  02/18/2012   Procedure: SUBXYPHOID PERICARDIAL WINDOW;  Surgeon: Ivin Poot, MD;  Location: [place];  Service: Thoracic;  Laterality: N/A;  TEE    There were no vitals filed for this visit.       Subjective Assessment - 11/11/15 1117    Subjective The patient reports walking several months ago when she felt an intense pain in her left knee.  The patient was so bad she did not think she would be able to make it back to her house.  She underwent a left knee arthroscopic surgery on 10/29/15.  Her resting pain-level is a 3-4/10 but can easily rise to a 7-8/10 if walking too much.   Limitations Walking   How long can you walk comfortably? Short community distances.   Currently in Pain? Yes   Pain Score 4    Pain Location Knee   Pain Orientation Left   Pain Descriptors / Indicators Aching;Throbbing   Pain Type Surgical pain   Pain Onset More than a month ago   Pain Frequency Constant   Aggravating Factors   Increased walking.   Pain Relieving Factors Elevate and being off feet.              [facility] PT Assessment - 11/11/15 0001      Assessment   Medical Diagnosis Left knee arthroscopy; medial meniscectomy, chondroplasty.   Referring Provider Earlie Server MD.   Onset Date/Surgical Date --  10/29/15 (surgery date).     Precautions   Precaution Comments Pain-free left quadriceps strengthening.     Restrictions   Weight Bearing Restrictions No     Balance Screen   Has the patient fallen in the past 6 months No   Has the patient had a decrease in activity level because of a fear of falling?  No   Is the patient reluctant to leave their home because of a fear of falling?  No     Home Environment   Living Environment Private residence     Prior Function   Level of Independence Independent     Observation/Other Assessments   Observations Left knee incision sites are well healed.     Observation/Other Assessments-Edema    Edema Circumferential     Circumferential Edema   Circumferential - Right LT 2.5 cms > RT.     ROM / Strength   AROM / PROM /  Strength AROM;Strength     AROM   Overall AROM Comments Left knee -5 degrees to 105 degrees.     Strength   Overall Strength Comments Left hip flexion= 4/5.  Left knee notable for significant left quadriceps atrophy and a decrease in volitional contraction of this muscle group.     Palpation   Palpation comment Tender to palpation over left knee medial joint line.     Ambulation/Gait   Gait Comments Antalgic gait with a slight decrease in left stance time compared to right.                   [facility] Adult PT Treatment/Exercise - 11/11/15 0001      Electrical Stimulation   Electrical Stimulation Location Left knee.   Electrical Stimulation Action IFC x 15 minutes.   Electrical Stimulation Goals Pain                PT Education - 11/11/15 1137    Education provided Yes   Person(s) Educated Patient    Methods Explanation   Comprehension Verbalized understanding;Returned demonstration          PT Short Term Goals - 10/09/15 1107      PT SHORT TERM GOAL #1   Title Ind with an initial HEP.   Time 2   Period Weeks   Status Achieved           PT Long Term Goals - 10/09/15 1107      PT LONG TERM GOAL #1   Title Independent with a final HEP.   Time 8   Period Weeks   Status On-going     PT LONG TERM GOAL #2   Title Eliminate left LE pain/symptoms.   Time 8   Period Weeks   Status On-going     PT LONG TERM GOAL #3   Title Sit 30 minutes with pain not > 3/10.   Time 8   Period Weeks   Status On-going     PT LONG TERM GOAL #4   Title Stand 30 minutes with pain not > 3/10.   Time 8   Period Weeks   Status On-going     PT LONG TERM GOAL #5   Title Perform ADL's with pain not > 3/10.   Time 8   Period Weeks   Status On-going               Plan - 11/11/15 1137    Clinical Impression Statement The patient presents s/p left knee arthroscopic surgery with a medial meniscectomy and chondroplasty.  She still has high pain-level (7+/10) with increased walking.  She initially had a 5 degrees extension loss but achieved full extension after performing quad sets.  Her flexion is limited to 105 degrees and she has notable left quadriceps atrophy and some remaining left knee swelling.     Rehab Potential Good   PT Frequency 2x / week   PT Duration 8 weeks   PT Treatment/Interventions ADLs/Self Care Home Management;Cryotherapy;Electrical Stimulation;Moist Heat;Therapeutic exercise;Patient/family education;Passive range of motion;Vasopneumatic Device;Manual techniques   PT Next Visit Plan VMS to left quadriceps; pain-free left quadriceps strengthening; QS, SLR's, hip abduction, Bike and PROM to improve ROM; Modalites as needed for pain control.       Patient will benefit from skilled therapeutic intervention in order to improve the following deficits and impairments:   Pain, Decreased activity tolerance, Increased edema, Decreased strength, Decreased range of motion  Visit Diagnosis: Pain in left knee -  Plan: PT plan of care cert/re-cert  Stiffness of left knee, not elsewhere classified - Plan: PT plan of care cert/re-cert  Localized edema - Plan: PT plan of care cert/re-cert      G-Codes - [id number]    Functional Assessment Tool Used FOTO...61% limitation.   Functional Limitation Mobility: Walking and moving around   Mobility: Walking and Moving Around Current Status [phone]) At least 60 percent but less than 80 percent impaired, limited or restricted   Mobility: Walking and Moving Around Goal Status [phone]) At least 20 percent but less than 40 percent impaired, limited or restricted       Problem List Patient Active Problem List   Diagnosis Date Noted  . Diabetes ([place]) 05/08/2015  . Left elbow pain 11/15/2013  . S/P pericardial surgery 02/20/2012  . Second degree AV block, Mobitz type I 02/18/2012  . Pericardial effusion 02/16/2012  . Seasonal allergies   . Osteopenia     Audrey [name], Audrey [name] 11/11/2015, 12:21 PM  [facility] [address] [place], [place], [address] Phone: [phone]   Fax:  [phone]  Name: Audrey [name] MRN: [id number] Date of Birth: Jun 14, 1945

## 2015-11-11 NOTE — Patient Instructions (Signed)
Patient instructed in left quad sets.

## 2015-11-12 ENCOUNTER — Other Ambulatory Visit: Payer: Self-pay | Admitting: Endocrinology

## 2015-11-12 ENCOUNTER — Encounter: Payer: Medicare HMO | Admitting: Physical Therapy

## 2015-11-12 DIAGNOSIS — R69 Illness, unspecified: Secondary | ICD-10-CM | POA: Diagnosis not present

## 2015-11-12 DIAGNOSIS — E109 Type 1 diabetes mellitus without complications: Secondary | ICD-10-CM | POA: Diagnosis not present

## 2015-11-19 ENCOUNTER — Encounter: Payer: Self-pay | Admitting: Physical Therapy

## 2015-11-19 ENCOUNTER — Telehealth: Payer: Self-pay | Admitting: Endocrinology

## 2015-11-19 ENCOUNTER — Ambulatory Visit: Payer: Medicare HMO | Admitting: Physical Therapy

## 2015-11-19 DIAGNOSIS — M25562 Pain in left knee: Secondary | ICD-10-CM | POA: Diagnosis not present

## 2015-11-19 DIAGNOSIS — R69 Illness, unspecified: Secondary | ICD-10-CM | POA: Diagnosis not present

## 2015-11-19 DIAGNOSIS — R600 Localized edema: Secondary | ICD-10-CM

## 2015-11-19 DIAGNOSIS — M25662 Stiffness of left knee, not elsewhere classified: Secondary | ICD-10-CM

## 2015-11-19 MED ORDER — GLUCOSE BLOOD VI STRP: ORAL_STRIP | 6 refills | Status: DC

## 2015-11-19 NOTE — Telephone Encounter (Signed)
Patient stated pharmacy need Dx code in order to fill prescription for FREESTYLE LITE test strip, she is at the pharmacy waiting.  [place], [phone] (Phone) [phone] (Fax)

## 2015-11-19 NOTE — Patient Instructions (Signed)
  Knee Extension (Sitting)   Place __0-3__ pound weight on left ankle and straighten knee fully, lower slowly. Repeat _10___ times per set. Do __2-3__ sets per session. Do __2-3__ sessions per day.    Straight Leg Raise   Tighten stomach and slowly raise locked right leg __4__ inches from floor. Repeat __10-30__ times per set. Do __2__ sets per session. Do __2__ sessions per day.

## 2015-11-19 NOTE — Telephone Encounter (Signed)
Refill submitted. 

## 2015-11-19 NOTE — Therapy (Signed)
[facility] [place], [place], [address] Phone: [phone]   Fax:  [phone]  Physical Therapy Treatment  Patient Details  Name: Audrey [name] MRN: [id number] Date of Birth: Aug 29, 1945 Referring Provider: Earlie Server MD.  Encounter Date: 11/19/2015      PT End of Session - 11/19/15 1152    Visit Number 2   Number of Visits 16   Date for PT Re-Evaluation 01/09/16   PT Start Time 1115   PT Stop Time 1159   PT Time Calculation (min) 44 min   Activity Tolerance Patient tolerated treatment well   Behavior During Therapy [facility] for tasks assessed/performed      Past Medical History:  Diagnosis Date  . Diabetes mellitus ([place])   . Hyperlipidemia   . Osteopenia   . Pericardial effusion   . PONV (postoperative nausea and vomiting)   . Seasonal allergies     Past Surgical History:  Procedure Laterality Date  . rectal polyps removed    . right wrist fx    . SUBXYPHOID PERICARDIAL WINDOW  02/18/2012   Procedure: SUBXYPHOID PERICARDIAL WINDOW;  Surgeon: Ivin Poot, MD;  Location: [place];  Service: Thoracic;  Laterality: N/A;  TEE    There were no vitals filed for this visit.      Subjective Assessment - 11/19/15 1121    Subjective Patient reports some soeness and stiffness in knee today   Limitations Walking   How long can you sit comfortably? 15 minutes.   How long can you stand comfortably? 15 minutes.   How long can you walk comfortably? Short community distances.   Diagnostic tests X-ray of spine "showed bone on bone."   Patient Stated Goals Get rid of this pain.   Currently in Pain? Yes   Pain Score 2    Pain Location Knee   Pain Orientation Left   Pain Descriptors / Indicators Aching;Sore;Tightness   Pain Type Surgical pain   Pain Onset More than a month ago   Pain Frequency Constant   Aggravating Factors  increased activity    Pain Relieving Factors rest                          [facility] Adult PT Treatment/Exercise - 11/19/15 0001      Exercises   Exercises Knee/Hip     Knee/Hip Exercises: Aerobic   Nustep L4 x63min     Knee/Hip Exercises: Supine   Straight Leg Raises Strengthening;Left;2 sets;10 reps     Knee/Hip Exercises: Sidelying   Hip ABduction Strengthening;Left;10 reps   Hip ABduction Limitations some back soreness with exercise   Clams 2x10     Electrical Stimulation   Electrical Stimulation Location Left knee.   Electrical Stimulation Action VMS to [employer]   Electrical Stimulation Parameters 10/10 x15 min with SAQ   Electrical Stimulation Goals Neuromuscular facilitation     Manual Therapy   Manual Therapy Passive ROM   Passive ROM gentle PRON for left knee flexion and ext, patient able to acheive full ROM after                PT Education - 11/19/15 1151    Education provided Yes   Education Details HEP   Person(s) Educated Patient   Methods Explanation;Demonstration;Handout   Comprehension Verbalized understanding;Returned demonstration          PT Short Term Goals - 10/09/15 1107      PT SHORT TERM  GOAL #1   Title Ind with an initial HEP.   Time 2   Period Weeks   Status Achieved           PT Long Term Goals - 10/09/15 1107      PT LONG TERM GOAL #1   Title Independent with a final HEP.   Time 8   Period Weeks   Status On-going     PT LONG TERM GOAL #2   Title Eliminate left LE pain/symptoms.   Time 8   Period Weeks   Status On-going     PT LONG TERM GOAL #3   Title Sit 30 minutes with pain not > 3/10.   Time 8   Period Weeks   Status On-going     PT LONG TERM GOAL #4   Title Stand 30 minutes with pain not > 3/10.   Time 8   Period Weeks   Status On-going     PT LONG TERM GOAL #5   Title Perform ADL's with pain not > 3/10.   Time 8   Period Weeks   Status On-going               Plan - 11/19/15 1154    Clinical Impression Statement Patient progressing and tolerated treatment well  today. Patient had no increased pain. Patient was able to acheive full ROM after gentle stretching. Patient was given initial HEP for strengthening and is independent. Patient goals ongoing due to pain deficits.   Rehab Potential Good   Clinical Impairments Affecting Rehab Potential Osteopenia and DM with insulin pump.   PT Frequency 2x / week   PT Duration 8 weeks   PT Treatment/Interventions ADLs/Self Care Home Management;Cryotherapy;Electrical Stimulation;Moist Heat;Therapeutic exercise;Patient/family education;Passive range of motion;Vasopneumatic Device;Manual techniques   PT Next Visit Plan cont with POC per MPT for pain free quad strength (bike/nustep, step ups, LAQ, SAQ) /VMS and modalities PRN   Consulted and Agree with Plan of Care Patient      Patient will benefit from skilled therapeutic intervention in order to improve the following deficits and impairments:  Pain, Decreased activity tolerance, Increased edema, Decreased strength, Decreased range of motion  Visit Diagnosis: Pain in left knee  Stiffness of left knee, not elsewhere classified  Localized edema     Problem List Patient Active Problem List   Diagnosis Date Noted  . Diabetes ([place]) 05/08/2015  . Left elbow pain 11/15/2013  . S/[name] pericardial surgery 02/20/2012  . Second degree AV block, Mobitz type I 02/18/2012  . Pericardial effusion 02/16/2012  . Seasonal allergies   . Osteopenia     Audrey [name] 11/19/2015, 11:59 AM  [facility] [place], [place], [address] Phone: [phone]   Fax:  [phone]  Name: Audrey [name] MRN: [id number] Date of Birth: Jun 24, 1945

## 2015-11-26 ENCOUNTER — Encounter: Payer: Self-pay | Admitting: Physical Therapy

## 2015-11-26 ENCOUNTER — Ambulatory Visit: Payer: Medicare HMO | Admitting: Physical Therapy

## 2015-11-26 DIAGNOSIS — M25662 Stiffness of left knee, not elsewhere classified: Secondary | ICD-10-CM

## 2015-11-26 DIAGNOSIS — M25562 Pain in left knee: Secondary | ICD-10-CM | POA: Diagnosis not present

## 2015-11-26 DIAGNOSIS — R600 Localized edema: Secondary | ICD-10-CM

## 2015-11-26 NOTE — Therapy (Signed)
[facility] [place], [place], [address] Phone: [phone]   Fax:  [phone]  Physical Therapy Treatment  Patient Details  Name: Audrey [name] MRN: [id number] Date of Birth: 1945/09/27 Referring Provider: Earlie Server MD.  Encounter Date: 11/26/2015      PT End of Session - 11/26/15 1142    Visit Number 3   Number of Visits 16   Date for PT Re-Evaluation 01/09/16   PT Start Time 1116   PT Stop Time 1158   PT Time Calculation (min) 42 min   Activity Tolerance Patient tolerated treatment well   Behavior During Therapy [facility] for tasks assessed/performed      Past Medical History:  Diagnosis Date  . Diabetes mellitus ([place])   . Hyperlipidemia   . Osteopenia   . Pericardial effusion   . PONV (postoperative nausea and vomiting)   . Seasonal allergies     Past Surgical History:  Procedure Laterality Date  . rectal polyps removed    . right wrist fx    . SUBXYPHOID PERICARDIAL WINDOW  02/18/2012   Procedure: SUBXYPHOID PERICARDIAL WINDOW;  Surgeon: Ivin Poot, MD;  Location: [place];  Service: Thoracic;  Laterality: N/A;  TEE    There were no vitals filed for this visit.      Subjective Assessment - 11/26/15 1117    Subjective Patient reports some ongoing soeness and stiffness in knee esp with exercise   Limitations Walking   How long can you sit comfortably? 15 minutes.   How long can you stand comfortably? 15 minutes.   How long can you walk comfortably? Short community distances.   Diagnostic tests X-ray of spine "showed bone on bone."   Currently in Pain? Yes   Pain Score 2    Pain Location Knee   Pain Orientation Left   Pain Descriptors / Indicators Aching   Pain Type Surgical pain   Pain Onset More than a month ago   Pain Frequency Constant   Aggravating Factors  increased activity   Pain Relieving Factors at rest                         [facility] Adult PT Treatment/Exercise -  11/26/15 0001      Lumbar Exercises: Seated   Long Arc Quad on Chair Strengthening;Left;3 sets;10 reps;Weights   LAQ on Chair Weights (lbs) 2     Knee/Hip Exercises: Aerobic   Nustep L4 x70min     Knee/Hip Exercises: Standing   Lateral Step Up 2 sets;10 reps;Left;Step Height: 4"   Forward Step Up Left;3 sets;10 reps;Step Height: 4"     Knee/Hip Exercises: Supine   Straight Leg Raises Strengthening;Left;2 sets;10 reps     Electrical Stimulation   Electrical Stimulation Location Left knee.   Electrical Stimulation Action VMS to VMO with SAQ for activation   Electrical Stimulation Parameters 10x10 cocontract x 71min   Electrical Stimulation Goals Neuromuscular facilitation                  PT Short Term Goals - 10/09/15 1107      PT SHORT TERM GOAL #1   Title Ind with an initial HEP.   Time 2   Period Weeks   Status Achieved           PT Long Term Goals - 10/09/15 1107      PT LONG TERM GOAL #1   Title Independent with a final HEP.  Time 8   Period Weeks   Status On-going     PT LONG TERM GOAL #2   Title Eliminate left LE pain/symptoms.   Time 8   Period Weeks   Status On-going     PT LONG TERM GOAL #3   Title Sit 30 minutes with pain not > 3/10.   Time 8   Period Weeks   Status On-going     PT LONG TERM GOAL #4   Title Stand 30 minutes with pain not > 3/10.   Time 8   Period Weeks   Status On-going     PT LONG TERM GOAL #5   Title Perform ADL's with pain not > 3/10.   Time 8   Period Weeks   Status On-going               Plan - 11/26/15 1155    Clinical Impression Statement Patient progressing with little pain overall. Patient has difficulty with walking long distance due to slight antalgic gait. Patient has full ROM overall after stretching. Educated patient on pain free exercises and self ROM. Follow up sent to MPT for updated goals for knee as appropriate.    Rehab Potential Good   Clinical Impairments Affecting Rehab  Potential Osteopenia and DM with insulin pump.   PT Frequency 2x / week   PT Duration 8 weeks   PT Treatment/Interventions ADLs/Self Care Home Management;Cryotherapy;Electrical Stimulation;Moist Heat;Therapeutic exercise;Patient/family education;Passive range of motion;Vasopneumatic Device;Manual techniques   PT Next Visit Plan cont with POC per MPT for pain free quad strength (bike/nustep, step ups, LAQ, SAQ) /VMS and modalities PRN (MD. French Ana 12/04/15)   Consulted and Agree with Plan of Care Patient      Patient will benefit from skilled therapeutic intervention in order to improve the following deficits and impairments:  Pain, Decreased activity tolerance, Increased edema, Decreased strength, Decreased range of motion  Visit Diagnosis: Pain in left knee  Stiffness of left knee, not elsewhere classified  Localized edema     Problem List Patient Active Problem List   Diagnosis Date Noted  . Diabetes ([place]) 05/08/2015  . Left elbow pain 11/15/2013  . S/P pericardial surgery 02/20/2012  . Second degree AV block, Mobitz type I 02/18/2012  . Pericardial effusion 02/16/2012  . Seasonal allergies   . Osteopenia     Audrey [name] P, PTA 11/26/2015, 12:01 PM  Audrey [name], PTA 11/26/15 12:01 PM  [facility] [address] [place], [place], [address] Phone: [phone]   Fax:  [phone]  Name: Audrey [name] MRN: [id number] Date of Birth: Apr 24, 1945

## 2015-12-02 ENCOUNTER — Ambulatory Visit: Payer: Medicare HMO | Admitting: *Deleted

## 2015-12-02 DIAGNOSIS — M25662 Stiffness of left knee, not elsewhere classified: Secondary | ICD-10-CM

## 2015-12-02 DIAGNOSIS — M25562 Pain in left knee: Secondary | ICD-10-CM | POA: Diagnosis not present

## 2015-12-02 DIAGNOSIS — R600 Localized edema: Secondary | ICD-10-CM

## 2015-12-02 NOTE — Therapy (Signed)
[facility] [place], [place], [address] Phone: [phone]   Fax:  [phone]  Physical Therapy Treatment  Patient Details  Name: Audrey [name] MRN: [id number] Date of Birth: 04/18/1945 Referring Provider: Earlie Server MD.  Encounter Date: 12/02/2015      PT End of Session - 12/02/15 1101    Visit Number 4   Number of Visits 16   Date for PT Re-Evaluation 01/09/16   PT Start Time 1030   PT Stop Time 1122   PT Time Calculation (min) 52 min      Past Medical History:  Diagnosis Date  . Diabetes mellitus ([place])   . Hyperlipidemia   . Osteopenia   . Pericardial effusion   . PONV (postoperative nausea and vomiting)   . Seasonal allergies     Past Surgical History:  Procedure Laterality Date  . rectal polyps removed    . right wrist fx    . SUBXYPHOID PERICARDIAL WINDOW  02/18/2012   Procedure: SUBXYPHOID PERICARDIAL WINDOW;  Surgeon: Ivin Poot, MD;  Location: [place];  Service: Thoracic;  Laterality: N/A;  TEE    There were no vitals filed for this visit.      Subjective Assessment - 12/02/15 1034    Subjective Patient reports some ongoing soeness and stiffness in  LT knee esp with exercise   Limitations Walking   How long can you sit comfortably? 15 minutes.   How long can you stand comfortably? 15 minutes.   How long can you walk comfortably? Short community distances.   Diagnostic tests X-ray of spine "showed bone on bone."   Patient Stated Goals Get rid of this pain.                         [facility] Adult PT Treatment/Exercise - 12/02/15 0001      Exercises   Exercises Knee/Hip     Lumbar Exercises: Seated   Long Arc Quad on Chair Strengthening;Left;3 sets;10 reps;Weights   LAQ on Chair Weights (lbs) 2     Knee/Hip Exercises: Aerobic   Nustep L5 seat 6, x15 mins     Knee/Hip Exercises: Standing   Lateral Step Up 2 sets;10 reps;Left;Step Height: 4"   Forward Step Up  Left;3 sets;10 reps;Step Height: 4"   Rocker Board 3 minutes  calf stretching     Knee/Hip Exercises: Supine   Straight Leg Raises Strengthening;Left;2 sets;10 reps     Knee/Hip Exercises: Sidelying   Clams 2x10     Electrical Stimulation   Electrical Stimulation Location Left knee.  VMS x 10 mins 10 secs on/off with Pt performing quad sets and SLR   Electrical Stimulation Goals Neuromuscular facilitation                  PT Short Term Goals - 10/09/15 1107      PT SHORT TERM GOAL #1   Title Ind with an initial HEP.   Time 2   Period Weeks   Status Achieved           PT Long Term Goals - 11/26/15 1239      PT LONG TERM GOAL #1   Title Independent with a final HEP.   Time 8   Period Weeks   Status New     PT LONG TERM GOAL #2   Title 0 to 125 degrees of active left knee ROM.   Time 8   Period Weeks  Status New     PT LONG TERM GOAL #3   Title 5/5 left LE strength.   Time 8   Period Weeks   Status New     PT LONG TERM GOAL #5   Title Perform ADL's with pain not > 3/10.   Time 8   Period Weeks   Status New               Plan - 12/02/15 1142    Clinical Impression Statement Pt did great today and continues to progress towards goals. She had improved AROM 4-145 degrees and PROM to full now. Her chief complaint is with long distance walking due to antalgic gait pattern that develops.. Today's Note will be sent to MD   Rehab Potential Good   Clinical Impairments Affecting Rehab Potential Osteopenia and DM with insulin pump.   PT Frequency 2x / week   PT Duration 8 weeks   PT Treatment/Interventions ADLs/Self Care Home Management;Cryotherapy;Electrical Stimulation;Moist Heat;Therapeutic exercise;Patient/family education;Passive range of motion;Vasopneumatic Device;Manual techniques   PT Next Visit Plan cont with POC per MPT for pain free quad strength (bike/nustep, step ups, LAQ, SAQ) /VMS and modalities PRN (MD. French Ana 12/04/15)       Patient will benefit from skilled therapeutic intervention in order to improve the following deficits and impairments:  Pain, Decreased activity tolerance, Increased edema, Decreased strength, Decreased range of motion  Visit Diagnosis: Pain in left knee  Stiffness of left knee, not elsewhere classified  Localized edema     Problem List Patient Active Problem List   Diagnosis Date Noted  . Diabetes ([place]) 05/08/2015  . Left elbow pain 11/15/2013  . S/P pericardial surgery 02/20/2012  . Second degree AV block, Mobitz type I 02/18/2012  . Pericardial effusion 02/16/2012  . Seasonal allergies   . Osteopenia     RAMSEUR,CHRIS , PTA 12/02/2015, 11:58 AM  [facility] [place], [place], [address] Phone: [phone]   Fax:  [phone]  Name: Audrey [name] MRN: [id number] Date of Birth: Nov 01, 1945

## 2015-12-04 DIAGNOSIS — S83242D Other tear of medial meniscus, current injury, left knee, subsequent encounter: Secondary | ICD-10-CM | POA: Diagnosis not present

## 2015-12-05 ENCOUNTER — Ambulatory Visit: Payer: Medicare HMO | Admitting: *Deleted

## 2015-12-05 DIAGNOSIS — M25662 Stiffness of left knee, not elsewhere classified: Secondary | ICD-10-CM

## 2015-12-05 DIAGNOSIS — M25562 Pain in left knee: Secondary | ICD-10-CM | POA: Diagnosis not present

## 2015-12-05 NOTE — Therapy (Signed)
[facility] [place], [place], [address] Phone: [phone]   Fax:  [phone]  Physical Therapy Treatment  Patient Details  Name: LEEANNE [name] MRN: [id number] Date of Birth: 11-11-1945 Referring Provider: Earlie Server MD.  Encounter Date: 12/05/2015      PT End of Session - 12/05/15 1039    Visit Number 5   Number of Visits 16   Date for PT Re-Evaluation 01/09/16   PT Start Time 1030   PT Stop Time 1121   PT Time Calculation (min) 51 min      Past Medical History:  Diagnosis Date  . Diabetes mellitus ([place])   . Hyperlipidemia   . Osteopenia   . Pericardial effusion   . PONV (postoperative nausea and vomiting)   . Seasonal allergies     Past Surgical History:  Procedure Laterality Date  . rectal polyps removed    . right wrist fx    . SUBXYPHOID PERICARDIAL WINDOW  02/18/2012   Procedure: SUBXYPHOID PERICARDIAL WINDOW;  Surgeon: Ivin Poot, MD;  Location: [facility];  Service: Thoracic;  Laterality: N/A;  TEE    There were no vitals filed for this visit.      Subjective Assessment - 12/05/15 1038    Subjective Patient reports some ongoing soeness and stiffness in  LT knee esp with exercise   Limitations Walking   How long can you sit comfortably? 15 minutes.   How long can you stand comfortably? 15 minutes.   How long can you walk comfortably? Short community distances.   Diagnostic tests X-ray of spine "showed bone on bone."   Patient Stated Goals Get rid of this pain.   Currently in Pain? Yes   Pain Score 2    Pain Location Knee   Pain Orientation Left   Pain Descriptors / Indicators Aching   Pain Type Surgical pain   Pain Onset More than a month ago   Pain Frequency Intermittent                         [facility] Adult PT Treatment/Exercise - 12/05/15 0001      Exercises   Exercises Knee/Hip     Knee/Hip Exercises: Aerobic   Nustep L5 seat 6, x15 mins     Knee/Hip  Exercises: Supine   Straight Leg Raises Strengthening;Left;2 sets;10 reps     Knee/Hip Exercises: Sidelying   Hip ABduction Strengthening;Left;10 reps;3 sets     Electrical Stimulation   Electrical Stimulation Location Left knee.  VMS x 10 mins 10 secs on/off with Pt performing quad sets and SLR   Electrical Stimulation Goals Neuromuscular facilitation                  PT Short Term Goals - 10/09/15 1107      PT SHORT TERM GOAL #1   Title Ind with an initial HEP.   Time 2   Period Weeks   Status Achieved           PT Long Term Goals - 12/05/15 1046      PT LONG TERM GOAL #1   Title Independent with a final HEP.   Time 8   Period Weeks   Status Achieved     PT LONG TERM GOAL #2   Title 0 to 125 degrees of active left knee ROM.   Time 8   Period Weeks   Status Achieved     PT LONG  TERM GOAL #3   Title 5/5 left LE strength.   Time 8   Period Weeks   Status Not Met  NM 4/5 strength     PT LONG TERM GOAL #4   Title Stand 30 minutes with pain not > 3/10.   Time 8   Period Weeks   Status Achieved     PT LONG TERM GOAL #5   Title Perform ADL's with pain not > 3/10.   Time 8   Period Weeks   Status Achieved               Plan - 12-26-15 1039    Clinical Impression Statement DC to HEP as Pt and MD All goals were met except LTG for strength 5/5, Pt was 4/5   Rehab Potential Good   Clinical Impairments Affecting Rehab Potential Osteopenia and DM with insulin pump.   PT Frequency 2x / week   PT Duration 8 weeks   PT Treatment/Interventions ADLs/Self Care Home Management;Cryotherapy;Electrical Stimulation;Moist Heat;Therapeutic exercise;Patient/family education;Passive range of motion;Vasopneumatic Device;Manual techniques   PT Next Visit Plan cont with POC per MPT for pain free quad strength (bike/nustep, step ups, LAQ, SAQ) /VMS and modalities PRN (MD. French Ana 12/04/15)   Consulted and Agree with Plan of Care Patient      Patient will  benefit from skilled therapeutic intervention in order to improve the following deficits and impairments:  Pain, Decreased activity tolerance, Increased edema, Decreased strength, Decreased range of motion  Visit Diagnosis: Pain in left knee  Stiffness of left knee, not elsewhere classified       G-Codes - December 26, 2015 1232    Functional Assessment Tool Used 5th visit and DC FOTO 45% limited      Problem List Patient Active Problem List   Diagnosis Date Noted  . Diabetes ([place]) 05/08/2015  . Left elbow pain 11/15/2013  . S/P pericardial surgery 02/20/2012  . Second degree AV block, Mobitz type I 02/18/2012  . Pericardial effusion 02/16/2012  . Seasonal allergies   . Osteopenia     RAMSEUR,CHRIS PTA 12-26-2015, 12:40 PM  [facility] [place], [place], [address] Phone: [phone]   Fax:  [phone]  Name: Audrey [name] MRN: [id number] Date of Birth: [birth year]  PHYSICAL THERAPY DISCHARGE SUMMARY  Visits from Start of Care: 5.  Current functional level related to goals / functional outcomes: Please see above.   Remaining deficits: Left LE strength deficit.   Education / Equipment: HEP.  Plan: Patient agrees to discharge.  Patient goals were partially met. Patient is being discharged due to not returning since the last visit.  ?????    Mali Applegate MPT

## 2015-12-11 DIAGNOSIS — H524 Presbyopia: Secondary | ICD-10-CM | POA: Diagnosis not present

## 2015-12-11 DIAGNOSIS — H35363 Drusen (degenerative) of macula, bilateral: Secondary | ICD-10-CM | POA: Diagnosis not present

## 2015-12-17 MED ORDER — GLUCOSE BLOOD VI STRP: ORAL_STRIP | 6 refills | Status: DC

## 2015-12-17 NOTE — Telephone Encounter (Signed)
Patient need a new prescription for glucose blood (FREESTYLE LITE) test strip.   [place], [address] [phone] (Phone) [phone] (Fax)

## 2015-12-17 NOTE — Telephone Encounter (Signed)
Refill submitted originally on 11/19/2015. Second refill submitted on 12/17/2015.

## 2015-12-27 DIAGNOSIS — R69 Illness, unspecified: Secondary | ICD-10-CM | POA: Diagnosis not present

## 2016-01-17 DIAGNOSIS — R69 Illness, unspecified: Secondary | ICD-10-CM | POA: Diagnosis not present

## 2016-01-18 NOTE — Progress Notes (Signed)
Subjective:    Patient ID: Audrey [name], female    DOB: [birth year], 70 y.o.   MRN: [id number]  HPI Pt returns for f/u of diabetes mellitus:  DM type: type 1 is dx'ed, based on lean body habitus and insulin sensitivity.   Dx'ed: [id number] Complications: none. Therapy: insulin since 07/03/2009.   GDM: never.   DKA: never. Severe hypoglycemia: never.  Pancreatitis: never.  Other: she has been on pump rx (Animas Vibe) since early 2015-07-04.   Interval history: she has been on pump rx since early 2015/07/04.  She takes these settings:   basal rate of 0.2 units/hr. mealtime bolus of 1 unit/ 40 grams carbohydrate.  She adds 1 unit to her calculated breakfast bolus. correction bolus (which some people call "sensitivity," or "insulin sensitivity ratio," or just "isr") of 1 unit for each 100 by which your glucose exceeds 100.   She seldom has hypoglycemia, and these episodes are mild.  No recent steroids. she brings a record of her cbg's which I have reviewed today.  It varies from 58-200.  There is no trend throughout the day.  She averages approx 18 units total per day.   Past Medical History:  Diagnosis Date  . Diabetes mellitus ([place])   . Hyperlipidemia   . Osteopenia   . Pericardial effusion   . PONV (postoperative nausea and vomiting)   . Seasonal allergies     Past Surgical History:  Procedure Laterality Date  . rectal polyps removed    . right wrist fx    . SUBXYPHOID PERICARDIAL WINDOW  02/18/2012   Procedure: SUBXYPHOID PERICARDIAL WINDOW;  Surgeon: Ivin Poot, MD;  Location: [facility];  Service: Thoracic;  Laterality: N/A;  TEE    Social History   Social History  . Marital status: Widowed    Spouse name: died 07-04-2011  . Number of children: 1  . Years of education: N/A   Occupational History  . computer/ desk top    Social History Main Topics  . Smoking status: Never Smoker  . Smokeless tobacco: Never Used  . Alcohol use No  . Drug use: No  . Sexual activity: Not on file   Other  Topics Concern  . Not on file   Social History Narrative  . No narrative on file    Current Outpatient Prescriptions on File Prior to Visit  Medication Sig Dispense Refill  . aspirin 81 MG tablet Take 81 mg by mouth daily.    . Calcium Carb-Cholecalciferol (CALTRATE 600+D) 600-800 MG-UNIT TABS Take by mouth.    . diphenhydrAMINE (BENADRYL) 25 mg capsule Take 25 mg by mouth every 6 (six) hours as needed.    Marland Kitchen HYDROcodone-acetaminophen (NORCO/VICODIN) 5-325 MG tablet     . insulin aspart (NOVOLOG) 100 UNIT/ML injection 40 units per day per her insulin pump daily. Dx code: E11.8 40 mL 2  . Insulin Infusion Pump Supplies (INSET INFUSION SET 23" 6MM) MISC 1 Device by Does not apply route every 3 (three) days.    . Insulin Infusion Pump Supplies (INSULIN PUMP SYRINGE RESERVOIR) MISC 1 Device by Does not apply route every 3 (three) days. Animas 2 ml    . pravastatin (PRAVACHOL) 40 MG tablet Take 40 mg by mouth daily.    . TURMERIC PO Take by mouth.     No current facility-administered medications on file prior to visit.     Allergies  Allergen Reactions  . Januvia [Sitagliptin]     Family History  Problem  Relation Age of Onset  . Heart attack Father   . Diabetes Brother   . Heart disease Brother   . Cancer Sister     ovarian    BP 122/70   Pulse 93   Wt 112 lb (50.8 kg)   SpO2 97%   BMI 19.84 kg/m   Review of Systems Denies LOC    Objective:   Physical Exam VITAL SIGNS:  See vs page GENERAL: no distress Pulses: dorsalis pedis intact bilat.   MSK: no deformity of the feet CV: no leg edema.  Skin:  normal temp on the feet.  Neuro: sensation is intact to touch on the feet.   A1c=8.4%    Assessment & Plan:  Type 1 DM: she needs increased rx: therapy limited by variable cbg's.  I advised continuous glucose monitor, but she declines.    Patient is advised the following: Patient Instructions  Please continue these settings: basal rate of 0.2 units/hr mealtime bolus  to 1 unit/ 40 grams carbohydrate, except please add 1 unit to your calculated breakfast bolus.   correction bolus (which some people call "sensitivity," or "insulin sensitivity ratio," or just "isr") of 1 unit for each 100 by which your glucose exceeds 100.   check your blood sugar 4 times a day: before the 3 meals, and at bedtime.  also check if you have symptoms of your blood sugar being too high or too low.  please keep a record of the readings and bring it to your next appointment here (or you can bring the meter itself).  You can write it on any piece of paper.  please call us sooner if your blood sugar goes below 70, or if you have a lot of readings over 200.    Please consider getting a continuous glucose monitor when you see [name] today.   Please come back for a follow-up appointment in 2 months.

## 2016-01-19 ENCOUNTER — Encounter: Payer: Medicare HMO | Attending: Endocrinology | Admitting: Nutrition

## 2016-01-19 ENCOUNTER — Ambulatory Visit (INDEPENDENT_AMBULATORY_CARE_PROVIDER_SITE_OTHER): Payer: Medicare HMO | Admitting: Endocrinology

## 2016-01-19 ENCOUNTER — Telehealth: Payer: Self-pay

## 2016-01-19 VITALS — BP 122/70 | HR 93 | Wt 112.0 lb

## 2016-01-19 DIAGNOSIS — Z0000 Encounter for general adult medical examination without abnormal findings: Secondary | ICD-10-CM | POA: Diagnosis not present

## 2016-01-19 DIAGNOSIS — E46 Unspecified protein-calorie malnutrition: Secondary | ICD-10-CM | POA: Diagnosis not present

## 2016-01-19 DIAGNOSIS — R69 Illness, unspecified: Secondary | ICD-10-CM | POA: Diagnosis not present

## 2016-01-19 DIAGNOSIS — E1065 Type 1 diabetes mellitus with hyperglycemia: Secondary | ICD-10-CM

## 2016-01-19 DIAGNOSIS — E119 Type 2 diabetes mellitus without complications: Secondary | ICD-10-CM | POA: Diagnosis not present

## 2016-01-19 DIAGNOSIS — E0800 Diabetes mellitus due to underlying condition with hyperosmolarity without nonketotic hyperglycemic-hyperosmolar coma (NKHHC): Secondary | ICD-10-CM

## 2016-01-19 DIAGNOSIS — R636 Underweight: Secondary | ICD-10-CM | POA: Diagnosis not present

## 2016-01-19 DIAGNOSIS — E782 Mixed hyperlipidemia: Secondary | ICD-10-CM | POA: Diagnosis not present

## 2016-01-19 DIAGNOSIS — M810 Age-related osteoporosis without current pathological fracture: Secondary | ICD-10-CM | POA: Diagnosis not present

## 2016-01-19 DIAGNOSIS — E109 Type 1 diabetes mellitus without complications: Secondary | ICD-10-CM | POA: Diagnosis not present

## 2016-01-19 DIAGNOSIS — I10 Essential (primary) hypertension: Secondary | ICD-10-CM | POA: Diagnosis not present

## 2016-01-19 LAB — POCT GLYCOSYLATED HEMOGLOBIN (HGB A1C): Hemoglobin A1C: 8.4

## 2016-01-19 MED ORDER — GLUCOSE BLOOD VI STRP: ORAL_STRIP | 1 refills | Status: DC

## 2016-01-19 NOTE — Telephone Encounter (Signed)
I contacted the patient, she stated she needed to verify if the test strips for her glucometer had been sent. I advised the refill has been sent per her request.

## 2016-01-19 NOTE — Telephone Encounter (Signed)
Patient ask you to give her a call °

## 2016-01-19 NOTE — Patient Instructions (Addendum)
Please continue these settings: basal rate of 0.2 units/hr mealtime bolus to 1 unit/ 40 grams carbohydrate, except please add 1 unit to your calculated breakfast bolus.   correction bolus (which some people call "sensitivity," or "insulin sensitivity ratio," or just "isr") of 1 unit for each 100 by which your glucose exceeds 100.   check your blood sugar 4 times a day: before the 3 meals, and at bedtime.  also check if you have symptoms of your blood sugar being too high or too low.  please keep a record of the readings and bring it to your next appointment here (or you can bring the meter itself).  You can write it on any piece of paper.  please call us sooner if your blood sugar goes below 70, or if you have a lot of readings over 200.    Please consider getting a continuous glucose monitor when you see [name] today.   Please come back for a follow-up appointment in 2 months.

## 2016-01-20 ENCOUNTER — Telehealth: Payer: Self-pay | Admitting: Endocrinology

## 2016-01-20 DIAGNOSIS — R69 Illness, unspecified: Secondary | ICD-10-CM | POA: Diagnosis not present

## 2016-01-20 MED ORDER — GLUCOSE BLOOD VI STRP: 1.0000 | ORAL_STRIP | Freq: Three times a day (TID) | 3 refills | Status: DC

## 2016-01-20 NOTE — Telephone Encounter (Signed)
Patient stated insurance would like to know why patient is testing 4 times a day.please advise

## 2016-01-20 NOTE — Telephone Encounter (Signed)
See message and please advise on how to proceed.  

## 2016-01-20 NOTE — Telephone Encounter (Signed)
i'll do PA 

## 2016-01-20 NOTE — Telephone Encounter (Signed)
I contacted the patient and she is requesting [place] to change the frequency to 3 times per day. Patient stated her insurance advised her they will pay for 3 times per day and she does not want to deal with the PA for the strips.

## 2016-01-20 NOTE — Telephone Encounter (Signed)
Ok, I have changed to tid, and I sent a prescription to your pharmacy

## 2016-01-20 NOTE — Progress Notes (Signed)
Patient reports having much stress when she fills the cartridge/infusion set.  Is asking if she can go back on the insulin pens.  Is very stressed that Animas pump is discontinuing their support in 2 years.  Discussed options with her and she filled out OmniPod paperwork, thinking this would be the easiest pump now for her to work.   I told her that she will probably get a denial from Medicare, but that we can write a LOM on her behalf.  Discussed the option of a 30 day free trial with Dexcom of the OmniPod.  She wants to wait to see what [place]alling says about paying for the new insuliin pump.   Discussed the advantages of the Dexcom, but she was not eager to learn anything more at this time.

## 2016-01-20 NOTE — Telephone Encounter (Signed)
Patient need a PA of medication glucose blood (FREESTYLE LITE) test strip  [facility] [address] [place] [address] [phone] (Phone) [phone] (Fax)

## 2016-01-20 NOTE — Patient Instructions (Signed)
Continue to test blood sugars before meals and ate bedtime.   Contact us when you learn about insurance coverage of new pump.

## 2016-01-21 DIAGNOSIS — R69 Illness, unspecified: Secondary | ICD-10-CM | POA: Diagnosis not present

## 2016-01-21 NOTE — Telephone Encounter (Signed)
I contacted the patient and advised we have submitted the refill for the test strips. Patient voiced understanding and had no further questions at this time.

## 2016-01-23 MED ORDER — GLUCOSE BLOOD VI STRP: 1.0000 | ORAL_STRIP | Freq: Four times a day (QID) | 3 refills | Status: DC

## 2016-01-23 NOTE — Telephone Encounter (Signed)
Patient stated she need a PA on glucose blood (FREESTYLE LITE) test strip  But if you call this insurance  they will tell you whether it is authorized or not.  Please call pt  [phone] Opt 1

## 2016-01-23 NOTE — Telephone Encounter (Signed)
Pt called and said she is still having trouble getting her Freestyle Lite Strips and her insurance in telling her to do a PA in order to get them.  She just needs them through the rest of the year, insurance is switching her meter at the beginning of the year.

## 2016-01-23 NOTE — Telephone Encounter (Signed)
[facility] called back and said that the test strips are still saying they're not approved.

## 2016-01-23 NOTE — Telephone Encounter (Signed)
I contacted [facility] and advised I had obtained the authorization number. Tiffany advised me she will continue to run the refill until the Rx goes through. She had no further questions at this time.

## 2016-01-23 NOTE — Telephone Encounter (Signed)
I contacted the patient and advised we have obtained the authorization for the freestlye lite test strips. Authorization is good from 01/23/2016-01/22/2017. Auth number is [id number]. Patient notified of this as well.

## 2016-01-26 DIAGNOSIS — R69 Illness, unspecified: Secondary | ICD-10-CM | POA: Diagnosis not present

## 2016-02-10 DIAGNOSIS — E109 Type 1 diabetes mellitus without complications: Secondary | ICD-10-CM | POA: Diagnosis not present

## 2016-02-18 ENCOUNTER — Telehealth: Payer: Self-pay | Admitting: Endocrinology

## 2016-02-18 NOTE — Telephone Encounter (Signed)
[employer] called in requesting a verbal order for the Pt's testing supplies, [phone]

## 2016-02-18 NOTE — Telephone Encounter (Signed)
I contacted [employer] and provided the verbal order for the test strips and lancets to Santiago Glad the rep with [employer]. She had no futher questions at this time.

## 2016-03-29 ENCOUNTER — Other Ambulatory Visit: Payer: Self-pay | Admitting: Endocrinology

## 2016-04-01 ENCOUNTER — Telehealth: Payer: Self-pay | Admitting: Endocrinology

## 2016-04-01 MED ORDER — INSULIN ASPART 100 UNIT/ML ~~LOC~~ SOLN: SUBCUTANEOUS | 2 refills | Status: DC

## 2016-04-01 NOTE — Telephone Encounter (Signed)
Refill submitted. 

## 2016-04-01 NOTE — Telephone Encounter (Signed)
Need new prescription for NOVOLOG 100 UNIT/ML injection  [facility] [address] [place] [address] [phone] (Phone) [phone] (Fax)

## 2016-04-02 NOTE — Telephone Encounter (Signed)
[place] calling on the status of novalog stated she need a dx code.

## 2016-04-02 NOTE — Telephone Encounter (Signed)
Rx submitted with diagnosis code.

## 2016-04-20 ENCOUNTER — Ambulatory Visit: Payer: Medicare HMO | Admitting: Endocrinology

## 2016-04-20 ENCOUNTER — Encounter: Payer: Medicare HMO | Attending: Endocrinology | Admitting: Nutrition

## 2016-04-20 ENCOUNTER — Other Ambulatory Visit: Payer: Self-pay

## 2016-04-20 DIAGNOSIS — R69 Illness, unspecified: Secondary | ICD-10-CM | POA: Diagnosis not present

## 2016-04-20 DIAGNOSIS — E1101 Type 2 diabetes mellitus with hyperosmolarity with coma: Secondary | ICD-10-CM

## 2016-04-20 DIAGNOSIS — E109 Type 1 diabetes mellitus without complications: Secondary | ICD-10-CM | POA: Diagnosis not present

## 2016-04-20 DIAGNOSIS — M5136 Other intervertebral disc degeneration, lumbar region: Secondary | ICD-10-CM | POA: Diagnosis not present

## 2016-04-20 DIAGNOSIS — M7501 Adhesive capsulitis of right shoulder: Secondary | ICD-10-CM | POA: Diagnosis not present

## 2016-04-20 MED ORDER — INSULIN ASPART 100 UNIT/ML ~~LOC~~ SOLN: SUBCUTANEOUS | 2 refills | Status: DC

## 2016-04-20 NOTE — Progress Notes (Signed)
Pt. Is using [facility] and her [employer] said that Novolog is not a covered insulin.  I phoned them, and they said that it is.  It is Humalog and Apidra that are not covered.  She is wanting to get them sent through the mail from [employer] order, which her [employer] uses.  Note to [facility] to order Novolog from them using 48u of insulin per day.   She was told that OmniPod is now covered by Medicare.  She want this, and so,  I call the repre. and left her a message to call the patient about what to do to get this pump.   She had no problems with pump, but continues to say that she has to follow the list of directions for her to change the infusion set, and reservour.

## 2016-04-20 NOTE — Telephone Encounter (Signed)
Adam from [facility] called stated patient will be able to get her insulin Novalog, with them at this time. Please advise   [phone]

## 2016-04-21 NOTE — Telephone Encounter (Signed)
I contacted [employer] and was advised a hard copy was needed for the novolog. Rx submitted.

## 2016-04-29 DIAGNOSIS — E109 Type 1 diabetes mellitus without complications: Secondary | ICD-10-CM | POA: Diagnosis not present

## 2016-05-12 ENCOUNTER — Other Ambulatory Visit: Payer: Self-pay

## 2016-05-12 MED ORDER — FREESTYLE LANCETS MISC: 5 refills | Status: DC

## 2016-05-12 MED ORDER — GLUCOSE BLOOD VI STRP: 1.0000 | ORAL_STRIP | Freq: Four times a day (QID) | 2 refills | Status: DC

## 2016-05-13 ENCOUNTER — Telehealth: Payer: Self-pay | Admitting: Endocrinology

## 2016-05-13 NOTE — Telephone Encounter (Signed)
Pt called in and said that she needs her One Touch Ultra 2 test strips and lancets, testing 4x daily, faxed to [employer].  She would like a call when this is done so she can call [employer] herself.

## 2016-05-13 NOTE — Telephone Encounter (Signed)
I contacted the patient and advised Rx for test strips and lancets were submitted to [facility] on 05/12/2016.

## 2016-05-17 ENCOUNTER — Telehealth: Payer: Self-pay | Admitting: Endocrinology

## 2016-05-17 NOTE — Telephone Encounter (Signed)
Pt states that she is needing an Rx for her lancets.  Stated that pharmacy can take a verbal if called.  [phone] for [facility].  They will also need something faxed.  Pt is wanting a call to let her know it has been completed.

## 2016-05-17 NOTE — Telephone Encounter (Signed)
I attempted to reach [employer] and was on hold for 15 + minutes. I contacted the patient and advised her of this and we would re fax the order to [facility]. Patient voiced understanding and had no further questions at this time.

## 2016-05-23 DIAGNOSIS — E1165 Type 2 diabetes mellitus with hyperglycemia: Secondary | ICD-10-CM | POA: Diagnosis not present

## 2016-05-26 ENCOUNTER — Encounter: Payer: Medicare HMO | Admitting: Nutrition

## 2016-05-26 ENCOUNTER — Ambulatory Visit: Payer: Medicare HMO | Admitting: Endocrinology

## 2016-06-14 ENCOUNTER — Other Ambulatory Visit: Payer: Self-pay

## 2016-06-14 ENCOUNTER — Encounter: Payer: Medicare HMO | Attending: Endocrinology | Admitting: Nutrition

## 2016-06-14 DIAGNOSIS — E119 Type 2 diabetes mellitus without complications: Secondary | ICD-10-CM

## 2016-06-14 DIAGNOSIS — M7501 Adhesive capsulitis of right shoulder: Secondary | ICD-10-CM | POA: Diagnosis not present

## 2016-06-14 DIAGNOSIS — Z1231 Encounter for screening mammogram for malignant neoplasm of breast: Secondary | ICD-10-CM | POA: Diagnosis not present

## 2016-06-14 DIAGNOSIS — E109 Type 1 diabetes mellitus without complications: Secondary | ICD-10-CM | POA: Insufficient documentation

## 2016-06-14 DIAGNOSIS — R69 Illness, unspecified: Secondary | ICD-10-CM | POA: Diagnosis not present

## 2016-06-14 DIAGNOSIS — Z794 Long term (current) use of insulin: Secondary | ICD-10-CM

## 2016-06-14 DIAGNOSIS — G4700 Insomnia, unspecified: Secondary | ICD-10-CM | POA: Diagnosis not present

## 2016-06-14 MED ORDER — INSULIN ASPART 100 UNIT/ML ~~LOC~~ SOLN: SUBCUTANEOUS | 2 refills | Status: DC

## 2016-06-15 ENCOUNTER — Telehealth: Payer: Self-pay | Admitting: Nutrition

## 2016-06-15 NOTE — Patient Instructions (Signed)
Read over booklet given on how to give a bolus and change pump

## 2016-06-15 NOTE — Progress Notes (Signed)
Pt. Was shown how to fill a pod, apply the pod and use the PDM to deliver the bolus. She reported good understanding of this. She filled the pod with Novolog insulin (70) units, and applied it to her upper outer left abdomen.   We transferred the settings from her Animas pump:  Basal rate: 0.2u/hr, I/C ratio: 40, ISF: 100, timing: 4 hours, target: 100 with corrections over 110.   She redemonstrated how to give a bolusX2, and written instructions were given to her in her own handwritting, on how to give a bolus She was also given a booklet with basic directions for pump use, like how to give a bolus, how to change the cartridge, and change pump settings.  She had no final questions.  I will see her again on Wednesday to change her first pod.

## 2016-06-15 NOTE — Telephone Encounter (Signed)
Message left on machine to call me to see how she is doing on her new pump

## 2016-06-16 ENCOUNTER — Encounter: Payer: Medicare HMO | Admitting: Nutrition

## 2016-06-16 NOTE — Telephone Encounter (Signed)
Pt. Reported no difficulty giving self boluses.  She says that she will not need to see me today to review how to change the pod.

## 2016-06-21 ENCOUNTER — Encounter: Payer: Medicare HMO | Admitting: Nutrition

## 2016-06-21 ENCOUNTER — Ambulatory Visit: Payer: Medicare HMO | Admitting: Endocrinology

## 2016-06-22 ENCOUNTER — Telehealth: Payer: Self-pay | Admitting: Nutrition

## 2016-06-22 NOTE — Telephone Encounter (Signed)
Audrey [name] reported no difficulty filling pods and taking meal time insulin.  Reported that blood sugar went over 300 after breakfast, when she changed the pod X3.  Told her to monitor blood sugars after breakfast when not changing the pod, to see if it is still rising quickly.  She is reporting that she must put more carbs in, and take more insulin that the PDM says to keep her blood sugar from rising.  An appt. Was made for next week to analyses this,and to continue with pump training.

## 2016-06-23 ENCOUNTER — Encounter: Payer: Medicare HMO | Admitting: Nutrition

## 2016-06-23 ENCOUNTER — Telehealth: Payer: Self-pay | Admitting: Nutrition

## 2016-06-23 DIAGNOSIS — E119 Type 2 diabetes mellitus without complications: Secondary | ICD-10-CM

## 2016-06-23 NOTE — Telephone Encounter (Signed)
Please increase basal to 0.4/hr.  We discussed

## 2016-06-23 NOTE — Telephone Encounter (Signed)
Pt. Is here for 2nd visit for OmniPod pump training.  Blood sugars have been running high despite her adding more carbs to her bolus calculation at meal time.  FBSs last 4 days: 236, 232, 232, 168, and Bs before meals are in the high 100s, and low 200s.   Basal rate: 0.2u/hr, I/C ratio: 40,  ISF: 75

## 2016-06-28 NOTE — Patient Instructions (Signed)
Review directions for changing pod, and changing basal rates. Test blood sugars before meals and at bedtime Call if blood sugars drop low, or remain high.

## 2016-06-28 NOTE — Progress Notes (Signed)
Patient reports no difficulty giving boluses.  She does report that blood sugars have been running very high, despite putting in more carbs than is eating, so that she "will get more insulin".  Pump download shows high readings all day--see telephone encounter to Dr. Loanne Drilling.  She did a pod change with some assistance from me. She used the book, and I wrote the steps down for her.   She was shown how to change the basal rate, by putting the pump In suspend, and then following the directions in her pump workbook.   She reported that it was very confusing, and did not know if she could do this on her own. We did not complete the rest of the pump instruction, because I did not feel that she was able to handle any more information at this time.   She was encouraged to continue to test her blood sugars before meals, and at bedtime and to call if the blood sugars drop low, or continue to stay high.  She agreed to do this.

## 2016-07-01 ENCOUNTER — Ambulatory Visit (INDEPENDENT_AMBULATORY_CARE_PROVIDER_SITE_OTHER): Payer: Medicare HMO | Admitting: Endocrinology

## 2016-07-01 VITALS — BP 138/84 | HR 82 | Ht 63.0 in | Wt 112.0 lb

## 2016-07-01 DIAGNOSIS — Z130 Encounter for screening for diseases of the blood and blood-forming organs and certain disorders involving the immune mechanism: Secondary | ICD-10-CM | POA: Diagnosis not present

## 2016-07-01 DIAGNOSIS — E1065 Type 1 diabetes mellitus with hyperglycemia: Secondary | ICD-10-CM | POA: Diagnosis not present

## 2016-07-01 DIAGNOSIS — R102 Pelvic and perineal pain: Secondary | ICD-10-CM | POA: Diagnosis not present

## 2016-07-01 DIAGNOSIS — Z681 Body mass index (BMI) 19 or less, adult: Secondary | ICD-10-CM | POA: Diagnosis not present

## 2016-07-01 DIAGNOSIS — Z01419 Encounter for gynecological examination (general) (routine) without abnormal findings: Secondary | ICD-10-CM | POA: Diagnosis not present

## 2016-07-01 DIAGNOSIS — D251 Intramural leiomyoma of uterus: Secondary | ICD-10-CM | POA: Diagnosis not present

## 2016-07-01 DIAGNOSIS — Z1389 Encounter for screening for other disorder: Secondary | ICD-10-CM | POA: Diagnosis not present

## 2016-07-01 DIAGNOSIS — Z8041 Family history of malignant neoplasm of ovary: Secondary | ICD-10-CM | POA: Diagnosis not present

## 2016-07-01 LAB — POCT GLYCOSYLATED HEMOGLOBIN (HGB A1C): HEMOGLOBIN A1C: 6.7

## 2016-07-01 NOTE — Patient Instructions (Addendum)
Please take these settings: basal rate of 0.35 units/hr mealtime bolus to 1 unit/ 40 grams carbohydrate, except please add 1 unit to your calculated breakfast bolus.   correction bolus (which some people call "sensitivity," or "insulin sensitivity ratio," or just "isr") of 1 unit for each 100 by which your glucose exceeds 100.   check your blood sugar 4 times a day: before the 3 meals, and at bedtime.  also check if you have symptoms of your blood sugar being too high or too low.  please keep a record of the readings and bring it to your next appointment here (or you can bring the meter itself).  You can write it on any piece of paper.  please call us sooner if your blood sugar goes below 70, or if you have a lot of readings over 200.    Please ask [name] to help with adjustments.    Please come back for a follow-up appointment in 3-4 months.

## 2016-07-01 NOTE — Progress Notes (Signed)
Subjective:    Patient ID: Audrey [name], female    DOB: 1945/11/27, 71 y.o.   MRN: [id number]  HPI Pt returns for f/u of diabetes mellitus:  DM type: type 1 is dx'ed, based on lean body habitus and insulin sensitivity.   Dx'ed: 8527 Complications: none. Therapy: insulin since 06-26-09.   GDM: never.   DKA: never. Severe hypoglycemia: never.  Pancreatitis: never.  Other: she has been on pump rx (now omnipod) since early June 27, 2015; she has not yet started continuous glucose monitor Interval history: she has been on pump rx since early 06-27-2015.  She takes these settings:  basal rate of 0.4 units/hr. mealtime bolus of 1 unit/ 40 grams carbohydrate.  She adds 1 unit to her calculated breakfast bolus.  correction bolus (which some people call "sensitivity," or "insulin sensitivity ratio," or just "isr") of 1 unit for each 100 by which your glucose exceeds 100.   No recent steroids. she brings a record of her cbg's which I have reviewed today.  It varies from 80-200.  There is no trend throughout the day.  She averages approx 16 units total per day. she seldom has hypoglycemia, and these episodes are mild.  pt states she feels well in general.   Past Medical History:  Diagnosis Date  . Diabetes mellitus ([place])   . Hyperlipidemia   . Osteopenia   . Pericardial effusion   . PONV (postoperative nausea and vomiting)   . Seasonal allergies     Past Surgical History:  Procedure Laterality Date  . rectal polyps removed    . right wrist fx    . SUBXYPHOID PERICARDIAL WINDOW  02/18/2012   Procedure: SUBXYPHOID PERICARDIAL WINDOW;  Surgeon: Ivin Poot, MD;  Location: [facility];  Service: Thoracic;  Laterality: N/A;  TEE    Social History   Social History  . Marital status: Widowed    Spouse name: died June 27, 2011  . Number of children: 1  . Years of education: N/A   Occupational History  . computer/ desk top    Social History Main Topics  . Smoking status: Never Smoker  . Smokeless tobacco: Never  Used  . Alcohol use No  . Drug use: No  . Sexual activity: Not on file   Other Topics Concern  . Not on file   Social History Narrative  . No narrative on file    Current Outpatient Prescriptions on File Prior to Visit  Medication Sig Dispense Refill  . aspirin 81 MG tablet Take 81 mg by mouth daily.    . Calcium Carb-Cholecalciferol (CALTRATE 600+D) 600-800 MG-UNIT TABS Take by mouth.    Marland Kitchen glucose blood (FREESTYLE LITE) test strip 1 each by Other route 4 (four) times daily. 500 each 2  . insulin aspart (NOVOLOG) 100 UNIT/ML injection USE WITH INSULIN PUMP 40 UNITS PER DAY 40 mL 2  . Insulin Infusion Pump Supplies (INSET INFUSION SET 23" 6MM) MISC 1 Device by Does not apply route every 3 (three) days.    . Insulin Infusion Pump Supplies (INSULIN PUMP SYRINGE RESERVOIR) MISC 1 Device by Does not apply route every 3 (three) days. Animas 2 ml    . Lancets (FREESTYLE) lancets Use to check blood sugar 4 times per day. 400 each 5  . multivitamin-lutein (OCUVITE-LUTEIN) CAPS capsule Take 1 capsule by mouth daily.    . pravastatin (PRAVACHOL) 40 MG tablet Take 40 mg by mouth daily.    . TURMERIC PO Take by mouth.    Marland Kitchen  diphenhydrAMINE (BENADRYL) 25 mg capsule Take 25 mg by mouth every 6 (six) hours as needed.    . vitamin C (ASCORBIC ACID) 500 MG tablet Take 500 mg by mouth daily.     No current facility-administered medications on file prior to visit.     Allergies  Allergen Reactions  . Morphine And Related   . Januvia [Sitagliptin]     Family History  Problem Relation Age of Onset  . Heart attack Father   . Diabetes Brother   . Heart disease Brother   . Cancer Sister     ovarian    BP 138/84   Pulse 82   Ht 5\' 3"  (1.6 m)   Wt 112 lb (50.8 kg)   SpO2 98%   BMI 19.84 kg/m    Review of Systems Denies LOC.      Objective:   Physical Exam VITAL SIGNS:  See vs page GENERAL: no distress Pulses: dorsalis pedis intact bilat.   MSK: no deformity of the feet CV: no leg  edema Skin:  no ulcer on the feet.  normal color and temp on the feet. Neuro: sensation is intact to touch on the feet.    Lab Results  Component Value Date   CREATININE 0.73 05/13/2015   BUN 20 05/13/2015   NA 142 05/13/2015   K 4.1 05/13/2015   CL 104 05/13/2015   CO2 27 05/13/2015   Lab Results  Component Value Date   HGBA1C 6.7 07/01/2016      Assessment & Plan:  Type 1 DM: slightly overcontrolled  Patient Instructions  Please take these settings: basal rate of 0.35 units/hr mealtime bolus to 1 unit/ 40 grams carbohydrate, except please add 1 unit to your calculated breakfast bolus.   correction bolus (which some people call "sensitivity," or "insulin sensitivity ratio," or just "isr") of 1 unit for each 100 by which your glucose exceeds 100.   check your blood sugar 4 times a day: before the 3 meals, and at bedtime.  also check if you have symptoms of your blood sugar being too high or too low.  please keep a record of the readings and bring it to your next appointment here (or you can bring the meter itself).  You can write it on any piece of paper.  please call us sooner if your blood sugar goes below 70, or if you have a lot of readings over 200.    Please ask [name] to help with adjustments.    Please come back for a follow-up appointment in 3-4 months.

## 2016-07-12 ENCOUNTER — Telehealth: Payer: Self-pay | Admitting: Nutrition

## 2016-07-12 NOTE — Telephone Encounter (Signed)
Trena called in [facility] for last week on her new pump.   FBSs: 106, 98, 137, 105, 138, 132, AND 120.  Says all other reading before meals are in the same range.  No lows in the last week.

## 2016-07-12 NOTE — Telephone Encounter (Signed)
Please continue the same pump settings I'll see you next time.

## 2016-07-12 NOTE — Telephone Encounter (Signed)
Patient advised of MD's instructions. She voiced understanding and had no further questions at this time.

## 2016-07-21 ENCOUNTER — Other Ambulatory Visit: Payer: Self-pay

## 2016-07-21 ENCOUNTER — Telehealth: Payer: Self-pay | Admitting: Endocrinology

## 2016-07-21 MED ORDER — GLUCOSE BLOOD VI STRP: 1.0000 | ORAL_STRIP | Freq: Four times a day (QID) | 2 refills | Status: DC

## 2016-07-21 NOTE — Telephone Encounter (Signed)
Patient need a PA on glucose blood (FREESTYLE LITE) test strip  [facility] [place], [place] [phone] (Phone) [phone] (Fax)

## 2016-07-21 NOTE — Telephone Encounter (Signed)
Ordered

## 2016-07-22 DIAGNOSIS — E109 Type 1 diabetes mellitus without complications: Secondary | ICD-10-CM | POA: Diagnosis not present

## 2016-07-27 ENCOUNTER — Other Ambulatory Visit: Payer: Self-pay

## 2016-07-27 ENCOUNTER — Telehealth: Payer: Self-pay

## 2016-07-27 DIAGNOSIS — G4700 Insomnia, unspecified: Secondary | ICD-10-CM | POA: Diagnosis not present

## 2016-07-27 DIAGNOSIS — R636 Underweight: Secondary | ICD-10-CM | POA: Diagnosis not present

## 2016-07-27 DIAGNOSIS — R69 Illness, unspecified: Secondary | ICD-10-CM | POA: Diagnosis not present

## 2016-07-27 MED ORDER — GLUCOSE BLOOD VI STRP: ORAL_STRIP | 2 refills | Status: DC

## 2016-07-27 NOTE — Telephone Encounter (Signed)
Patient came into the office today and stated that she never got her test strips that were ordered 07/21/16 so I called [employer] to do a verbal order- I spoke with Lattie Haw at [facility] and she transferred me to [place] who was the pharmacist that took the verbal order- I than called the patient and left a vm letting her know of the verbal order and I requested a call back if she had any further questions

## 2016-07-29 DIAGNOSIS — R69 Illness, unspecified: Secondary | ICD-10-CM | POA: Diagnosis not present

## 2016-08-04 DIAGNOSIS — E1165 Type 2 diabetes mellitus with hyperglycemia: Secondary | ICD-10-CM | POA: Diagnosis not present

## 2016-08-23 DIAGNOSIS — Z794 Long term (current) use of insulin: Secondary | ICD-10-CM | POA: Diagnosis not present

## 2016-08-23 DIAGNOSIS — Z79899 Other long term (current) drug therapy: Secondary | ICD-10-CM | POA: Diagnosis not present

## 2016-08-23 DIAGNOSIS — R69 Illness, unspecified: Secondary | ICD-10-CM | POA: Diagnosis not present

## 2016-08-23 DIAGNOSIS — E119 Type 2 diabetes mellitus without complications: Secondary | ICD-10-CM | POA: Diagnosis not present

## 2016-08-23 DIAGNOSIS — Z7982 Long term (current) use of aspirin: Secondary | ICD-10-CM | POA: Diagnosis not present

## 2016-08-23 DIAGNOSIS — G4700 Insomnia, unspecified: Secondary | ICD-10-CM | POA: Diagnosis not present

## 2016-08-23 DIAGNOSIS — E785 Hyperlipidemia, unspecified: Secondary | ICD-10-CM | POA: Diagnosis not present

## 2016-08-23 DIAGNOSIS — Z0000 Encounter for general adult medical examination without abnormal findings: Secondary | ICD-10-CM | POA: Diagnosis not present

## 2016-08-23 DIAGNOSIS — R0989 Other specified symptoms and signs involving the circulatory and respiratory systems: Secondary | ICD-10-CM | POA: Diagnosis not present

## 2016-08-23 DIAGNOSIS — Z681 Body mass index (BMI) 19 or less, adult: Secondary | ICD-10-CM | POA: Diagnosis not present

## 2016-10-18 DIAGNOSIS — E109 Type 1 diabetes mellitus without complications: Secondary | ICD-10-CM | POA: Diagnosis not present

## 2016-11-02 ENCOUNTER — Other Ambulatory Visit: Payer: Self-pay | Admitting: Family Medicine

## 2016-11-02 ENCOUNTER — Ambulatory Visit (INDEPENDENT_AMBULATORY_CARE_PROVIDER_SITE_OTHER): Payer: Medicare HMO | Admitting: Endocrinology

## 2016-11-02 ENCOUNTER — Encounter: Payer: Self-pay | Admitting: Endocrinology

## 2016-11-02 VITALS — BP 122/82 | HR 77 | Wt 112.6 lb

## 2016-11-02 DIAGNOSIS — E1065 Type 1 diabetes mellitus with hyperglycemia: Secondary | ICD-10-CM

## 2016-11-02 DIAGNOSIS — M810 Age-related osteoporosis without current pathological fracture: Secondary | ICD-10-CM | POA: Diagnosis not present

## 2016-11-02 DIAGNOSIS — R1032 Left lower quadrant pain: Secondary | ICD-10-CM | POA: Diagnosis not present

## 2016-11-02 DIAGNOSIS — G4700 Insomnia, unspecified: Secondary | ICD-10-CM | POA: Diagnosis not present

## 2016-11-02 DIAGNOSIS — E46 Unspecified protein-calorie malnutrition: Secondary | ICD-10-CM | POA: Diagnosis not present

## 2016-11-02 DIAGNOSIS — R69 Illness, unspecified: Secondary | ICD-10-CM | POA: Diagnosis not present

## 2016-11-02 LAB — POCT GLYCOSYLATED HEMOGLOBIN (HGB A1C): Hemoglobin A1C: 5.9

## 2016-11-02 NOTE — Patient Instructions (Addendum)
Please take these settings: basal rate of 0.3 units/hr mealtime bolus to 1 unit/ 40 grams carbohydrate, except please add 1 unit to your calculated breakfast bolus.   correction bolus (which some people call "sensitivity," or "insulin sensitivity ratio," or just "isr") of 1 unit for each 100 by which your glucose exceeds 150.   check your blood sugar 4 times a day: before the 3 meals, and at bedtime.  also check if you have symptoms of your blood sugar being too high or too low.  please keep a record of the readings and bring it to your next appointment here (or you can bring the meter itself).  You can write it on any piece of paper.  please call us sooner if your blood sugar goes below 70, or if you have a lot of readings over 200.   Please come back for a follow-up appointment in 3-4 months.

## 2016-11-02 NOTE — Progress Notes (Signed)
Subjective:    Patient ID: Audrey [name], female    DOB: 04-26-1945, 71 y.o.   MRN: [id number]  HPI Pt returns for f/u of diabetes mellitus:  DM type: type 1 is dx'ed, based on lean body habitus and insulin sensitivity.   Dx'ed: 6195 Complications: none. Therapy: insulin since 06-17-2009.   GDM: never.   DKA: never. Severe hypoglycemia: never.  Pancreatitis: never.  Other: she has been on pump rx (now Omnipod) since early June 18, 2015; she has not yet started continuous glucose monitor Interval history: she has been on pump rx since early 18-Jun-2015.  She takes these settings:  basal rate of 0.35 units/hr. mealtime bolus of 1 unit/ 40 grams carbohydrate.  She adds 1 unit to her calculated breakfast bolus.  correction bolus (which some people call "sensitivity," or "insulin sensitivity ratio," or just "isr") of 1 unit for each 100 by which your glucose exceeds 100.   No recent steroids. she brings a record of her cbg's which I have reviewed today.  It varies from 73-100's.  There is no trend throughout the day.  She averages approx 16-18 units total per day.  pt states she feels well in general.   Past Medical History:  Diagnosis Date  . Diabetes mellitus ([place])   . Hyperlipidemia   . Osteopenia   . Pericardial effusion   . PONV (postoperative nausea and vomiting)   . Seasonal allergies     Past Surgical History:  Procedure Laterality Date  . rectal polyps removed    . right wrist fx    . SUBXYPHOID PERICARDIAL WINDOW  02/18/2012   Procedure: SUBXYPHOID PERICARDIAL WINDOW;  Surgeon: Ivin Poot, MD;  Location: [facility];  Service: Thoracic;  Laterality: N/A;  TEE    Social History   Social History  . Marital status: Widowed    Spouse name: died 18-Jun-2011  . Number of children: 1  . Years of education: N/A   Occupational History  . computer/ desk top    Social History Main Topics  . Smoking status: Never Smoker  . Smokeless tobacco: Never Used  . Alcohol use No  . Drug use: No  . Sexual  activity: Not on file   Other Topics Concern  . Not on file   Social History Narrative  . No narrative on file    Current Outpatient Prescriptions on File Prior to Visit  Medication Sig Dispense Refill  . aspirin 81 MG tablet Take 81 mg by mouth daily.    . Calcium Carb-Cholecalciferol (CALTRATE 600+D) 600-800 MG-UNIT TABS Take by mouth.    . diphenhydrAMINE (BENADRYL) 25 mg capsule Take 25 mg by mouth every 6 (six) hours as needed.    Marland Kitchen escitalopram (LEXAPRO) 10 MG tablet Take 10 mg by mouth daily.    Marland Kitchen glucose blood (FREESTYLE LITE) test strip Use to check blood sugar 4 times daily Dx code- E10.65 500 each 2  . insulin aspart (NOVOLOG) 100 UNIT/ML injection USE WITH INSULIN PUMP 40 UNITS PER DAY 40 mL 2  . Insulin Infusion Pump Supplies (INSET INFUSION SET 23" 6MM) MISC 1 Device by Does not apply route every 3 (three) days.    . Insulin Infusion Pump Supplies (INSULIN PUMP SYRINGE RESERVOIR) MISC 1 Device by Does not apply route every 3 (three) days. Animas 2 ml    . Lancets (FREESTYLE) lancets Use to check blood sugar 4 times per day. 400 each 5  . multivitamin-lutein (OCUVITE-LUTEIN) CAPS capsule Take 1 capsule by mouth  daily.    . pravastatin (PRAVACHOL) 40 MG tablet Take 40 mg by mouth daily.    . TURMERIC PO Take by mouth.    . vitamin C (ASCORBIC ACID) 500 MG tablet Take 500 mg by mouth daily.    . diazepam (VALIUM) 5 MG tablet Take 5 mg by mouth every 6 (six) hours as needed for anxiety.     No current facility-administered medications on file prior to visit.     Allergies  Allergen Reactions  . Morphine And Related   . Januvia [Sitagliptin]     Family History  Problem Relation Age of Onset  . Heart attack Father   . Diabetes Brother   . Heart disease Brother   . Cancer Sister        ovarian    BP 122/82   Pulse 77   Wt 112 lb 9.6 oz (51.1 kg)   SpO2 96%   BMI 19.95 kg/m    Review of Systems She denies hypoglycemia.      Objective:   Physical  Exam VITAL SIGNS:  See vs page GENERAL: no distress Pulses: foot pulses are intact bilaterally.   MSK: no deformity of the feet or ankles.  CV: no edema of the legs or ankles Skin:  no ulcer on the feet or ankles.  normal color and temp on the feet and ankles Neuro: sensation is intact to touch on the feet and ankles.    A1c=5.9%      Assessment & Plan:  Type 1 DM: ovrecontrolled.   Patient Instructions  Please take these settings: basal rate of 0.3 units/hr mealtime bolus to 1 unit/ 40 grams carbohydrate, except please add 1 unit to your calculated breakfast bolus.   correction bolus (which some people call "sensitivity," or "insulin sensitivity ratio," or just "isr") of 1 unit for each 100 by which your glucose exceeds 150.   check your blood sugar 4 times a day: before the 3 meals, and at bedtime.  also check if you have symptoms of your blood sugar being too high or too low.  please keep a record of the readings and bring it to your next appointment here (or you can bring the meter itself).  You can write it on any piece of paper.  please call us sooner if your blood sugar goes below 70, or if you have a lot of readings over 200.    Please come back for a follow-up appointment in 3-4 months.

## 2016-11-03 ENCOUNTER — Other Ambulatory Visit: Payer: Medicare HMO

## 2016-11-03 DIAGNOSIS — E1165 Type 2 diabetes mellitus with hyperglycemia: Secondary | ICD-10-CM | POA: Diagnosis not present

## 2016-11-09 DIAGNOSIS — H26493 Other secondary cataract, bilateral: Secondary | ICD-10-CM | POA: Diagnosis not present

## 2016-11-11 ENCOUNTER — Ambulatory Visit
Admission: RE | Admit: 2016-11-11 | Discharge: 2016-11-11 | Disposition: A | Discharge status: Home / Self Care | Payer: Medicare HMO | Source: Ambulatory Visit | Attending: Family Medicine | Admitting: Family Medicine

## 2016-11-11 DIAGNOSIS — R1032 Left lower quadrant pain: Secondary | ICD-10-CM

## 2016-11-11 MED ORDER — IOPAMIDOL (ISOVUE-300) INJECTION 61%
100.0000 mL | Freq: Once | INTRAVENOUS | Status: AC | PRN
  Administered 2016-11-11: 100 mL via INTRAVENOUS

## 2016-12-08 DIAGNOSIS — H26492 Other secondary cataract, left eye: Secondary | ICD-10-CM | POA: Diagnosis not present

## 2016-12-08 DIAGNOSIS — Z961 Presence of intraocular lens: Secondary | ICD-10-CM | POA: Diagnosis not present

## 2016-12-18 DIAGNOSIS — R69 Illness, unspecified: Secondary | ICD-10-CM | POA: Diagnosis not present

## 2016-12-29 DIAGNOSIS — Z961 Presence of intraocular lens: Secondary | ICD-10-CM | POA: Diagnosis not present

## 2016-12-29 DIAGNOSIS — H26491 Other secondary cataract, right eye: Secondary | ICD-10-CM | POA: Diagnosis not present

## 2017-01-13 DIAGNOSIS — E109 Type 1 diabetes mellitus without complications: Secondary | ICD-10-CM | POA: Diagnosis not present

## 2017-02-03 DIAGNOSIS — E1165 Type 2 diabetes mellitus with hyperglycemia: Secondary | ICD-10-CM | POA: Diagnosis not present

## 2017-02-09 ENCOUNTER — Ambulatory Visit: Payer: Medicare HMO | Admitting: Endocrinology

## 2017-02-09 ENCOUNTER — Encounter: Payer: Self-pay | Admitting: Endocrinology

## 2017-02-09 VITALS — BP 142/72 | HR 85 | Wt 113.6 lb

## 2017-02-09 DIAGNOSIS — E782 Mixed hyperlipidemia: Secondary | ICD-10-CM | POA: Diagnosis not present

## 2017-02-09 DIAGNOSIS — E1065 Type 1 diabetes mellitus with hyperglycemia: Secondary | ICD-10-CM | POA: Diagnosis not present

## 2017-02-09 DIAGNOSIS — E46 Unspecified protein-calorie malnutrition: Secondary | ICD-10-CM | POA: Diagnosis not present

## 2017-02-09 DIAGNOSIS — Z0000 Encounter for general adult medical examination without abnormal findings: Secondary | ICD-10-CM | POA: Diagnosis not present

## 2017-02-09 DIAGNOSIS — E119 Type 2 diabetes mellitus without complications: Secondary | ICD-10-CM | POA: Diagnosis not present

## 2017-02-09 DIAGNOSIS — G4700 Insomnia, unspecified: Secondary | ICD-10-CM | POA: Diagnosis not present

## 2017-02-09 DIAGNOSIS — R69 Illness, unspecified: Secondary | ICD-10-CM | POA: Diagnosis not present

## 2017-02-09 DIAGNOSIS — M810 Age-related osteoporosis without current pathological fracture: Secondary | ICD-10-CM | POA: Diagnosis not present

## 2017-02-09 DIAGNOSIS — I10 Essential (primary) hypertension: Secondary | ICD-10-CM | POA: Diagnosis not present

## 2017-02-09 LAB — POCT GLYCOSYLATED HEMOGLOBIN (HGB A1C): Hemoglobin A1C: 6.8

## 2017-02-09 NOTE — Progress Notes (Signed)
Subjective:    Patient ID: Audrey [name], female    DOB: [birth year], 71 y.o.   MRN: [id number]  HPI Pt returns for f/u of diabetes mellitus:  DM type: type 1 is dx'ed, based on lean body habitus and insulin sensitivity.   Dx'ed: 5397 Complications: none. Therapy: insulin since 2009/06/07.   GDM: never.   DKA: never. Severe hypoglycemia: never.  Pancreatitis: never.  Other: she has been on pump rx (now Omnipod) since early 2015/06/08; she has not yet started continuous glucose monitor Interval history: she has been on pump rx since early Jun 08, 2015.  She takes these settings:  basal rate of 0.3 units/hr. mealtime bolus of 1 unit/ 40 grams carbohydrate.  She adds 1 unit to her calculated breakfast bolus.  correction bolus (which some people call "sensitivity," or "insulin sensitivity ratio," or just "isr") of 1 unit for each 100 by which your glucose exceeds 100.   Total daily dosage:18 units per day.   she brings a record of her cbg's which I have reviewed today. It varies from 78-300's.  There is no trend throughout the day.  She seldom has hypoglycemia, and these episodes are mild.   Past Medical History:  Diagnosis Date  . Diabetes mellitus ([place])   . Hyperlipidemia   . Osteopenia   . Pericardial effusion   . PONV (postoperative nausea and vomiting)   . Seasonal allergies     Past Surgical History:  Procedure Laterality Date  . rectal polyps removed    . right wrist fx    . SUBXYPHOID PERICARDIAL WINDOW  02/18/2012   Procedure: SUBXYPHOID PERICARDIAL WINDOW;  Surgeon: Ivin Poot, MD;  Location: [facility] OR;  Service: Thoracic;  Laterality: N/A;  TEE    Social History   Socioeconomic History  . Marital status: Widowed    Spouse name: died 2011-06-08  . Number of children: 1  . Years of education: Not on file  . Highest education level: Not on file  Social Needs  . Financial resource strain: Not on file  . Food insecurity - worry: Not on file  . Food insecurity - inability: Not on file  .  Transportation needs - medical: Not on file  . Transportation needs - non-medical: Not on file  Occupational History  . Occupation: Optician, dispensing top  Tobacco Use  . Smoking status: Never Smoker  . Smokeless tobacco: Never Used  Substance and Sexual Activity  . Alcohol use: No  . Drug use: No  . Sexual activity: Not on file  Other Topics Concern  . Not on file  Social History Narrative  . Not on file    Current Outpatient Medications on File Prior to Visit  Medication Sig Dispense Refill  . ALPRAZolam (XANAX) 0.5 MG tablet Take 0.5 mg by mouth every 6 (six) hours as needed for anxiety.    Marland Kitchen aspirin 81 MG tablet Take 81 mg by mouth daily.    . Calcium Carb-Cholecalciferol (CALTRATE 600+D) 600-800 MG-UNIT TABS Take by mouth.    Marland Kitchen CINNAMON PO Take 1,000 mg by mouth.    . diazepam (VALIUM) 5 MG tablet Take 5 mg by mouth every 6 (six) hours as needed for anxiety.    . diphenhydrAMINE (BENADRYL) 25 mg capsule Take 25 mg by mouth every 6 (six) hours as needed.    Marland Kitchen glucose blood (FREESTYLE LITE) test strip Use to check blood sugar 4 times daily Dx code- E10.65 500 each 2  . insulin aspart (NOVOLOG) 100 UNIT/ML  injection USE WITH INSULIN PUMP 40 UNITS PER DAY 40 mL 2  . Insulin Infusion Pump Supplies (INSET INFUSION SET 23" 6MM) MISC 1 Device by Does not apply route every 3 (three) days.    . Insulin Infusion Pump Supplies (INSULIN PUMP SYRINGE RESERVOIR) MISC 1 Device by Does not apply route every 3 (three) days. Animas 2 ml    . Lancets (FREESTYLE) lancets Use to check blood sugar 4 times per day. 400 each 5  . multivitamin-lutein (OCUVITE-LUTEIN) CAPS capsule Take 1 capsule by mouth daily.    . pravastatin (PRAVACHOL) 40 MG tablet Take 40 mg by mouth daily.    . sertraline (ZOLOFT) 100 MG tablet Take 100 mg by mouth daily.    . TURMERIC PO Take by mouth.    . vitamin C (ASCORBIC ACID) 500 MG tablet Take 500 mg by mouth daily.    Marland Kitchen escitalopram (LEXAPRO) 10 MG tablet Take 10 mg by  mouth daily.     No current facility-administered medications on file prior to visit.     Allergies  Allergen Reactions  . Morphine And Related   . Januvia [Sitagliptin]     Family History  Problem Relation Age of Onset  . Heart attack Father   . Diabetes Brother   . Heart disease Brother   . Cancer Sister        ovarian    BP (!) 142/72 (BP Location: Left Arm, Patient Position: Sitting, Cuff Size: Normal)   Pulse 85   Wt 113 lb 9.6 oz (51.5 kg)   SpO2 97%   BMI 20.12 kg/m   Review of Systems Denies LOC.     Objective:   Physical Exam VITAL SIGNS:  See vs page GENERAL: no distress NECK: There is no palpable thyroid enlargement.  No thyroid nodule is palpable.  No palpable lymphadenopathy at the anterior neck. LUNGS:  Clear to auscultation HEART:  Regular rate and rhythm without murmurs noted. Normal S1,S2.     Lab Results  Component Value Date   HGBA1C 6.8 02/09/2017      Assessment & Plan:  Type 1 DM: well-controlled Hypoglycemia: as this is mild and infrequent, we'll follow for now.   Patient Instructions  Please take these settings: basal rate of 0.3 units/hr mealtime bolus to 1 unit/ 40 grams carbohydrate, except please add 1 unit to your calculated breakfast bolus.   correction bolus (which some people call "sensitivity," or "insulin sensitivity ratio," or just "isr") of 1 unit for each 100 by which your glucose exceeds 150.   check your blood sugar 4 times a day: before the 3 meals, and at bedtime.  also check if you have symptoms of your blood sugar being too high or too low.  please keep a record of the readings and bring it to your next appointment here (or you can bring the meter itself).  You can write it on any piece of paper.  please call us sooner if your blood sugar goes below 70, or if you have a lot of readings over 200.   Please come back for a follow-up appointment in 4 months.

## 2017-02-09 NOTE — Patient Instructions (Addendum)
Please take these settings: basal rate of 0.3 units/hr mealtime bolus to 1 unit/ 40 grams carbohydrate, except please add 1 unit to your calculated breakfast bolus.   correction bolus (which some people call "sensitivity," or "insulin sensitivity ratio," or just "isr") of 1 unit for each 100 by which your glucose exceeds 150.   check your blood sugar 4 times a day: before the 3 meals, and at bedtime.  also check if you have symptoms of your blood sugar being too high or too low.  please keep a record of the readings and bring it to your next appointment here (or you can bring the meter itself).  You can write it on any piece of paper.  please call us sooner if your blood sugar goes below 70, or if you have a lot of readings over 200.   Please come back for a follow-up appointment in 4 months.

## 2017-04-12 DIAGNOSIS — E109 Type 1 diabetes mellitus without complications: Secondary | ICD-10-CM | POA: Diagnosis not present

## 2017-05-04 DIAGNOSIS — E1165 Type 2 diabetes mellitus with hyperglycemia: Secondary | ICD-10-CM | POA: Diagnosis not present

## 2017-05-30 ENCOUNTER — Other Ambulatory Visit: Payer: Self-pay | Admitting: Endocrinology

## 2017-06-08 ENCOUNTER — Other Ambulatory Visit: Payer: Self-pay | Admitting: Family Medicine

## 2017-06-08 DIAGNOSIS — M549 Dorsalgia, unspecified: Secondary | ICD-10-CM | POA: Diagnosis not present

## 2017-06-08 DIAGNOSIS — R69 Illness, unspecified: Secondary | ICD-10-CM | POA: Diagnosis not present

## 2017-06-08 DIAGNOSIS — M545 Low back pain: Secondary | ICD-10-CM

## 2017-06-08 DIAGNOSIS — R1032 Left lower quadrant pain: Secondary | ICD-10-CM | POA: Diagnosis not present

## 2017-06-15 ENCOUNTER — Encounter: Payer: Self-pay | Admitting: Endocrinology

## 2017-06-15 ENCOUNTER — Ambulatory Visit (INDEPENDENT_AMBULATORY_CARE_PROVIDER_SITE_OTHER): Payer: Medicare HMO | Admitting: Endocrinology

## 2017-06-15 ENCOUNTER — Other Ambulatory Visit: Payer: Medicare HMO

## 2017-06-15 VITALS — BP 150/72 | HR 74 | Wt 117.2 lb

## 2017-06-15 DIAGNOSIS — E1065 Type 1 diabetes mellitus with hyperglycemia: Secondary | ICD-10-CM

## 2017-06-15 DIAGNOSIS — Z1231 Encounter for screening mammogram for malignant neoplasm of breast: Secondary | ICD-10-CM | POA: Diagnosis not present

## 2017-06-15 LAB — POCT GLYCOSYLATED HEMOGLOBIN (HGB A1C): Hemoglobin A1C: 7.2

## 2017-06-15 NOTE — Progress Notes (Signed)
Subjective:    Patient ID: Audrey [name], female    DOB: [birth year], 72 y.o.   MRN: [id number]  HPI Pt returns for f/u of diabetes mellitus:  DM type: type 1 is dx'ed, based on lean body habitus and insulin sensitivity.   Dx'ed: 6433 Complications: none. Therapy: insulin since 04-Jun-2009.   GDM: never.   DKA: never. Severe hypoglycemia: never.  Pancreatitis: never.  Other: she has been on pump rx (now Omnipod) since early 06/05/15; she has not yet started continuous glucose monitor Interval history: she has been on pump rx since early June 05, 2015.  She takes these settings:  basal rate of 0.3 units/hr. mealtime bolus of 1 unit/ 40 grams carbohydrate.  She adds 1 unit to her calculated breakfast bolus.  correction bolus (which some people call "sensitivity," or "insulin sensitivity ratio," or just "isr") of 1 unit for each 100 by which your glucose exceeds 100.   Total daily dosage:18 units per day.   she brings a record of her cbg's which I have reviewed today. It varies from 78-300's.  There is no trend throughout the day.  She has hypoglycemia approx once per month, and these episodes are mild.   Past Medical History:  Diagnosis Date  . Diabetes mellitus ([place])   . Hyperlipidemia   . Osteopenia   . Pericardial effusion   . PONV (postoperative nausea and vomiting)   . Seasonal allergies     Past Surgical History:  Procedure Laterality Date  . rectal polyps removed    . right wrist fx    . SUBXYPHOID PERICARDIAL WINDOW  02/18/2012   Procedure: SUBXYPHOID PERICARDIAL WINDOW;  Surgeon: Ivin Poot, MD;  Location: [facility] OR;  Service: Thoracic;  Laterality: N/A;  TEE    Social History   Socioeconomic History  . Marital status: Widowed    Spouse name: died 06-05-2011  . Number of children: 1  . Years of education: Not on file  . Highest education level: Not on file  Occupational History  . Occupation: Optician, dispensing top  Social Needs  . Financial resource strain: Not on file  . Food  insecurity:    Worry: Not on file    Inability: Not on file  . Transportation needs:    Medical: Not on file    Non-medical: Not on file  Tobacco Use  . Smoking status: Never Smoker  . Smokeless tobacco: Never Used  Substance and Sexual Activity  . Alcohol use: No  . Drug use: No  . Sexual activity: Not on file  Lifestyle  . Physical activity:    Days per week: Not on file    Minutes per session: Not on file  . Stress: Not on file  Relationships  . Social connections:    Talks on phone: Not on file    Gets together: Not on file    Attends religious service: Not on file    Active member of club or organization: Not on file    Attends meetings of clubs or organizations: Not on file    Relationship status: Not on file  . Intimate partner violence:    Fear of current or ex partner: Not on file    Emotionally abused: Not on file    Physically abused: Not on file    Forced sexual activity: Not on file  Other Topics Concern  . Not on file  Social History Narrative  . Not on file    Current Outpatient Medications on File Prior  to Visit  Medication Sig Dispense Refill  . aspirin 81 MG tablet Take 81 mg by mouth daily.    . Calcium Carb-Cholecalciferol (CALTRATE 600+D) 600-800 MG-UNIT TABS Take by mouth.    Marland Kitchen CINNAMON PO Take 1,000 mg by mouth.    . diphenhydrAMINE (BENADRYL) 25 mg capsule Take 25 mg by mouth every 6 (six) hours as needed.    Marland Kitchen escitalopram (LEXAPRO) 10 MG tablet Take 10 mg by mouth daily.    Marland Kitchen glucose blood (FREESTYLE LITE) test strip Use to check blood sugar 4 times daily Dx code- E10.65 500 each 2  . insulin aspart (NOVOLOG) 100 UNIT/ML injection USE WITH INSULIN PUMP 40   UNITS DAILY 40 mL 2  . Insulin Infusion Pump Supplies (INSET INFUSION SET 23" 6MM) MISC 1 Device by Does not apply route every 3 (three) days.    . Insulin Infusion Pump Supplies (INSULIN PUMP SYRINGE RESERVOIR) MISC 1 Device by Does not apply route every 3 (three) days. Animas 2 ml    .  Lancets (FREESTYLE) lancets Use to check blood sugar 4 times per day. 400 each 5  . multivitamin-lutein (OCUVITE-LUTEIN) CAPS capsule Take 1 capsule by mouth daily.    . pravastatin (PRAVACHOL) 40 MG tablet Take 40 mg by mouth daily.    . sertraline (ZOLOFT) 100 MG tablet Take 100 mg by mouth daily.    . TURMERIC PO Take by mouth.    . vitamin C (ASCORBIC ACID) 500 MG tablet Take 500 mg by mouth daily.    Marland Kitchen ALPRAZolam (XANAX) 0.5 MG tablet Take 0.5 mg by mouth every 6 (six) hours as needed for anxiety.    . diazepam (VALIUM) 5 MG tablet Take 5 mg by mouth every 6 (six) hours as needed for anxiety.     No current facility-administered medications on file prior to visit.     Allergies  Allergen Reactions  . Morphine And Related   . Januvia [Sitagliptin]     Family History  Problem Relation Age of Onset  . Heart attack Father   . Diabetes Brother   . Heart disease Brother   . Cancer Sister        ovarian    BP (!) 150/72 (BP Location: Left Arm, Patient Position: Sitting, Cuff Size: Normal)   Pulse 74   Wt 117 lb 3.2 oz (53.2 kg)   SpO2 97%   BMI 20.76 kg/m    Review of Systems She denies LOC    Objective:   Physical Exam VITAL SIGNS:  See vs page GENERAL: no distress Pulses: dorsalis pedis intact bilat.   MSK: no deformity of the feet CV: no leg edema Skin:  no ulcer on the feet.  normal color and temp on the feet. Neuro: sensation is intact to touch on the feet  Lab Results  Component Value Date   HGBA1C 7.2 06/15/2017       Assessment & Plan:  Type 1 DM: well-controlled.    Patient Instructions  Please continue these settings: basal rate of 0.3 units/hr.   mealtime bolus to 1 unit/ 40 grams carbohydrate, except please add 1 unit to your calculated breakfast bolus.   correction bolus (which some people call "sensitivity," or "insulin sensitivity ratio," or just "isr") of 1 unit for each 100 by which your glucose exceeds 150.   check your blood sugar 4 times  a day: before the 3 meals, and at bedtime.  also check if you have symptoms of your blood sugar  being too high or too low.  please keep a record of the readings and bring it to your next appointment here (or you can bring the meter itself).  You can write it on any piece of paper.  please call us sooner if your blood sugar goes below 70, or if you have a lot of readings over 200.   Please come back for a follow-up appointment in 4 months.

## 2017-06-15 NOTE — Patient Instructions (Addendum)
Please continue these settings: basal rate of 0.3 units/hr.   mealtime bolus to 1 unit/ 40 grams carbohydrate, except please add 1 unit to your calculated breakfast bolus.   correction bolus (which some people call "sensitivity," or "insulin sensitivity ratio," or just "isr") of 1 unit for each 100 by which your glucose exceeds 150.   check your blood sugar 4 times a day: before the 3 meals, and at bedtime.  also check if you have symptoms of your blood sugar being too high or too low.  please keep a record of the readings and bring it to your next appointment here (or you can bring the meter itself).  You can write it on any piece of paper.  please call us sooner if your blood sugar goes below 70, or if you have a lot of readings over 200.   Please come back for a follow-up appointment in 4 months.

## 2017-06-22 DIAGNOSIS — M549 Dorsalgia, unspecified: Secondary | ICD-10-CM | POA: Diagnosis not present

## 2017-06-22 DIAGNOSIS — M6281 Muscle weakness (generalized): Secondary | ICD-10-CM | POA: Diagnosis not present

## 2017-06-22 DIAGNOSIS — M5489 Other dorsalgia: Secondary | ICD-10-CM | POA: Diagnosis not present

## 2017-06-22 DIAGNOSIS — M545 Low back pain: Secondary | ICD-10-CM | POA: Diagnosis not present

## 2017-06-28 DIAGNOSIS — R922 Inconclusive mammogram: Secondary | ICD-10-CM | POA: Diagnosis not present

## 2017-07-05 DIAGNOSIS — J309 Allergic rhinitis, unspecified: Secondary | ICD-10-CM | POA: Diagnosis not present

## 2017-07-05 DIAGNOSIS — E1165 Type 2 diabetes mellitus with hyperglycemia: Secondary | ICD-10-CM | POA: Diagnosis not present

## 2017-07-05 DIAGNOSIS — E785 Hyperlipidemia, unspecified: Secondary | ICD-10-CM | POA: Diagnosis not present

## 2017-07-05 DIAGNOSIS — G4700 Insomnia, unspecified: Secondary | ICD-10-CM | POA: Diagnosis not present

## 2017-07-05 DIAGNOSIS — Z7982 Long term (current) use of aspirin: Secondary | ICD-10-CM | POA: Diagnosis not present

## 2017-07-05 DIAGNOSIS — Z791 Long term (current) use of non-steroidal anti-inflammatories (NSAID): Secondary | ICD-10-CM | POA: Diagnosis not present

## 2017-07-05 DIAGNOSIS — G8929 Other chronic pain: Secondary | ICD-10-CM | POA: Diagnosis not present

## 2017-07-05 DIAGNOSIS — R69 Illness, unspecified: Secondary | ICD-10-CM | POA: Diagnosis not present

## 2017-07-05 DIAGNOSIS — Z7722 Contact with and (suspected) exposure to environmental tobacco smoke (acute) (chronic): Secondary | ICD-10-CM | POA: Diagnosis not present

## 2017-07-05 DIAGNOSIS — Z794 Long term (current) use of insulin: Secondary | ICD-10-CM | POA: Diagnosis not present

## 2017-07-07 DIAGNOSIS — M545 Low back pain: Secondary | ICD-10-CM | POA: Diagnosis not present

## 2017-07-07 DIAGNOSIS — M549 Dorsalgia, unspecified: Secondary | ICD-10-CM | POA: Diagnosis not present

## 2017-07-07 DIAGNOSIS — M6281 Muscle weakness (generalized): Secondary | ICD-10-CM | POA: Diagnosis not present

## 2017-07-07 DIAGNOSIS — M5489 Other dorsalgia: Secondary | ICD-10-CM | POA: Diagnosis not present

## 2017-07-08 DIAGNOSIS — E109 Type 1 diabetes mellitus without complications: Secondary | ICD-10-CM | POA: Diagnosis not present

## 2017-07-13 DIAGNOSIS — M545 Low back pain: Secondary | ICD-10-CM | POA: Diagnosis not present

## 2017-07-13 DIAGNOSIS — M6281 Muscle weakness (generalized): Secondary | ICD-10-CM | POA: Diagnosis not present

## 2017-07-13 DIAGNOSIS — M5489 Other dorsalgia: Secondary | ICD-10-CM | POA: Diagnosis not present

## 2017-07-13 DIAGNOSIS — M549 Dorsalgia, unspecified: Secondary | ICD-10-CM | POA: Diagnosis not present

## 2017-07-20 DIAGNOSIS — M5489 Other dorsalgia: Secondary | ICD-10-CM | POA: Diagnosis not present

## 2017-07-20 DIAGNOSIS — M545 Low back pain: Secondary | ICD-10-CM | POA: Diagnosis not present

## 2017-07-20 DIAGNOSIS — M549 Dorsalgia, unspecified: Secondary | ICD-10-CM | POA: Diagnosis not present

## 2017-07-20 DIAGNOSIS — M6281 Muscle weakness (generalized): Secondary | ICD-10-CM | POA: Diagnosis not present

## 2017-08-02 DIAGNOSIS — E1165 Type 2 diabetes mellitus with hyperglycemia: Secondary | ICD-10-CM | POA: Diagnosis not present

## 2017-08-10 DIAGNOSIS — I10 Essential (primary) hypertension: Secondary | ICD-10-CM | POA: Diagnosis not present

## 2017-08-10 DIAGNOSIS — E782 Mixed hyperlipidemia: Secondary | ICD-10-CM | POA: Diagnosis not present

## 2017-08-10 DIAGNOSIS — R69 Illness, unspecified: Secondary | ICD-10-CM | POA: Diagnosis not present

## 2017-08-10 DIAGNOSIS — M5136 Other intervertebral disc degeneration, lumbar region: Secondary | ICD-10-CM | POA: Diagnosis not present

## 2017-08-10 DIAGNOSIS — L237 Allergic contact dermatitis due to plants, except food: Secondary | ICD-10-CM | POA: Diagnosis not present

## 2017-08-10 DIAGNOSIS — E1169 Type 2 diabetes mellitus with other specified complication: Secondary | ICD-10-CM | POA: Diagnosis not present

## 2017-09-06 DIAGNOSIS — H35361 Drusen (degenerative) of macula, right eye: Secondary | ICD-10-CM | POA: Diagnosis not present

## 2017-09-26 DIAGNOSIS — R69 Illness, unspecified: Secondary | ICD-10-CM | POA: Diagnosis not present

## 2017-10-05 DIAGNOSIS — E109 Type 1 diabetes mellitus without complications: Secondary | ICD-10-CM | POA: Diagnosis not present

## 2017-10-19 ENCOUNTER — Encounter: Payer: Self-pay | Admitting: Endocrinology

## 2017-10-19 ENCOUNTER — Ambulatory Visit (INDEPENDENT_AMBULATORY_CARE_PROVIDER_SITE_OTHER): Payer: Medicare HMO | Admitting: Endocrinology

## 2017-10-19 VITALS — BP 128/76 | HR 94 | Ht 64.0 in | Wt 123.0 lb

## 2017-10-19 DIAGNOSIS — E1065 Type 1 diabetes mellitus with hyperglycemia: Secondary | ICD-10-CM

## 2017-10-19 DIAGNOSIS — L600 Ingrowing nail: Secondary | ICD-10-CM

## 2017-10-19 LAB — POCT GLYCOSYLATED HEMOGLOBIN (HGB A1C): Hemoglobin A1C: 7.6 % — AB (ref 4.0–5.6)

## 2017-10-19 NOTE — Progress Notes (Signed)
Subjective:    Patient ID: Audrey [name], female    DOB: November 13, 1945, 72 y.o.   MRN: [id number]  HPI Pt returns for f/u of diabetes mellitus:  DM type: type 1 is dx'ed, based on lean body habitus and insulin sensitivity.   Dx'ed: 8675 Complications: none. Therapy: insulin since 2009-06-06.   GDM: never.   DKA: never. Severe hypoglycemia: never.  Pancreatitis: never.  Other: she has been on pump rx (now Omnipod) since early Jun 07, 2015; she has not yet started continuous glucose monitor. Interval history: she has been on pump rx since early 07-Jun-2015.  She takes these settings:  basal rate of 0.3 units/hr. mealtime bolus of 1 unit/ 40 grams carbohydrate.  She adds 1 unit to her calculated breakfast bolus.  correction bolus (which some people call "sensitivity," or "insulin sensitivity ratio," or just "isr") of 1 unit for each 100 by which your glucose exceeds 100.   Total daily dosage:18 units per day.   no cbg record, but states cbg's are slightly higher recently.  She says this is related to acute sinusitis, but she has not been on steroids.  She seldom has hypoglycemia, and these episodes are mild.  Past Medical History:  Diagnosis Date  . Diabetes mellitus ([place])   . Hyperlipidemia   . Osteopenia   . Pericardial effusion   . PONV (postoperative nausea and vomiting)   . Seasonal allergies     Past Surgical History:  Procedure Laterality Date  . rectal polyps removed    . right wrist fx    . SUBXYPHOID PERICARDIAL WINDOW  02/18/2012   Procedure: SUBXYPHOID PERICARDIAL WINDOW;  Surgeon: Ivin Poot, MD;  Location: [facility] OR;  Service: Thoracic;  Laterality: N/A;  TEE    Social History   Socioeconomic History  . Marital status: Widowed    Spouse name: died Jun 07, 2011  . Number of children: 1  . Years of education: Not on file  . Highest education level: Not on file  Occupational History  . Occupation: Optician, dispensing top  Social Needs  . Financial resource strain: Not on file  . Food  insecurity:    Worry: Not on file    Inability: Not on file  . Transportation needs:    Medical: Not on file    Non-medical: Not on file  Tobacco Use  . Smoking status: Never Smoker  . Smokeless tobacco: Never Used  Substance and Sexual Activity  . Alcohol use: No  . Drug use: No  . Sexual activity: Not on file  Lifestyle  . Physical activity:    Days per week: Not on file    Minutes per session: Not on file  . Stress: Not on file  Relationships  . Social connections:    Talks on phone: Not on file    Gets together: Not on file    Attends religious service: Not on file    Active member of club or organization: Not on file    Attends meetings of clubs or organizations: Not on file    Relationship status: Not on file  . Intimate partner violence:    Fear of current or ex partner: Not on file    Emotionally abused: Not on file    Physically abused: Not on file    Forced sexual activity: Not on file  Other Topics Concern  . Not on file  Social History Narrative  . Not on file    Current Outpatient Medications on File Prior to Visit  Medication Sig Dispense Refill  . aspirin 81 MG tablet Take 81 mg by mouth daily.    . Calcium Carb-Cholecalciferol (CALTRATE 600+D) 600-800 MG-UNIT TABS Take by mouth.    Marland Kitchen CINNAMON PO Take 1,000 mg by mouth.    . diphenhydrAMINE (BENADRYL) 25 mg capsule Take 25 mg by mouth every 6 (six) hours as needed.    Marland Kitchen glucose blood (FREESTYLE LITE) test strip Use to check blood sugar 4 times daily Dx code- E10.65 500 each 2  . insulin aspart (NOVOLOG) 100 UNIT/ML injection USE WITH INSULIN PUMP 40   UNITS DAILY 40 mL 2  . Insulin Infusion Pump Supplies (INSET INFUSION SET 23" 6MM) MISC 1 Device by Does not apply route every 3 (three) days.    . Insulin Infusion Pump Supplies (INSULIN PUMP SYRINGE RESERVOIR) MISC 1 Device by Does not apply route every 3 (three) days. Animas 2 ml    . Lancets (FREESTYLE) lancets Use to check blood sugar 4 times per day.  400 each 5  . multivitamin-lutein (OCUVITE-LUTEIN) CAPS capsule Take 1 capsule by mouth daily.    . pravastatin (PRAVACHOL) 40 MG tablet Take 40 mg by mouth daily.    . sertraline (ZOLOFT) 100 MG tablet Take 50 mg by mouth daily.     . TURMERIC PO Take by mouth.    . vitamin C (ASCORBIC ACID) 500 MG tablet Take 500 mg by mouth daily.    Marland Kitchen ALPRAZolam (XANAX) 0.5 MG tablet Take 0.5 mg by mouth every 6 (six) hours as needed for anxiety.    . diazepam (VALIUM) 5 MG tablet Take 5 mg by mouth every 6 (six) hours as needed for anxiety.    Marland Kitchen escitalopram (LEXAPRO) 10 MG tablet Take 10 mg by mouth daily.     No current facility-administered medications on file prior to visit.     Allergies  Allergen Reactions  . Morphine And Related   . Januvia [Sitagliptin]     Family History  Problem Relation Age of Onset  . Heart attack Father   . Diabetes Brother   . Heart disease Brother   . Cancer Sister        ovarian    BP 128/76 (BP Location: Right Arm, Patient Position: Sitting, Cuff Size: Normal)   Pulse 94   Ht 5\' 4"  (1.626 m)   Wt 123 lb (55.8 kg)   SpO2 98%   BMI 21.11 kg/m    Review of Systems She denies LOC.  She has pain at the left great toe.     Objective:   Physical Exam VITAL SIGNS:  See vs page GENERAL: no distress Pulses: foot pulses are intact bilaterally.   MSK: no deformity of the feet or ankles.  CV: no edema of the legs or ankles Skin:  no ulcer on the feet or ankles.  normal color and temp on the feet and ankles.   Neuro: sensation is intact to touch on the feet and ankles.   Ext: left great toe is ingrown.     Lab Results  Component Value Date   HGBA1C 7.6 (A) 10/19/2017       Assessment & Plan:  Type 1 DM: this is the best control this pt should aim for, given variable cbg's.  Sinusitis, new: pt says this is affecting glycemic control. Hypoglycemia: we discussed: she declines continuous glucose monitor.    Patient Instructions  Please continue  these settings: basal rate of 0.3 units/hr.   mealtime bolus to 1  unit/ 40 grams carbohydrate, except please add 1 unit to your calculated breakfast bolus.   correction bolus (which some people call "sensitivity," or "insulin sensitivity ratio," or just "isr") of 1 unit for each 100 by which your glucose exceeds 150.   check your blood sugar 4 times a day: before the 3 meals, and at bedtime.  also check if you have symptoms of your blood sugar being too high or too low.  please keep a record of the readings and bring it to your next appointment here (or you can bring the meter itself).  You can write it on any piece of paper.  please call us sooner if your blood sugar goes below 70, or if you have a lot of readings over 200.  Please see a foot specialist.  you will receive a phone call, about a day and time for an appointment.   Please come back for a follow-up appointment in 4 months.

## 2017-10-19 NOTE — Patient Instructions (Addendum)
Please continue these settings: basal rate of 0.3 units/hr.   mealtime bolus to 1 unit/ 40 grams carbohydrate, except please add 1 unit to your calculated breakfast bolus.   correction bolus (which some people call "sensitivity," or "insulin sensitivity ratio," or just "isr") of 1 unit for each 100 by which your glucose exceeds 150.   check your blood sugar 4 times a day: before the 3 meals, and at bedtime.  also check if you have symptoms of your blood sugar being too high or too low.  please keep a record of the readings and bring it to your next appointment here (or you can bring the meter itself).  You can write it on any piece of paper.  please call us sooner if your blood sugar goes below 70, or if you have a lot of readings over 200.  Please see a foot specialist.  you will receive a phone call, about a day and time for an appointment.   Please come back for a follow-up appointment in 4 months.

## 2017-10-20 DIAGNOSIS — R69 Illness, unspecified: Secondary | ICD-10-CM | POA: Diagnosis not present

## 2017-10-31 DIAGNOSIS — E1165 Type 2 diabetes mellitus with hyperglycemia: Secondary | ICD-10-CM | POA: Diagnosis not present

## 2017-11-01 DIAGNOSIS — R69 Illness, unspecified: Secondary | ICD-10-CM | POA: Diagnosis not present

## 2017-11-08 DIAGNOSIS — L03031 Cellulitis of right toe: Secondary | ICD-10-CM | POA: Diagnosis not present

## 2017-11-08 DIAGNOSIS — M79675 Pain in left toe(s): Secondary | ICD-10-CM | POA: Diagnosis not present

## 2017-11-16 DIAGNOSIS — R69 Illness, unspecified: Secondary | ICD-10-CM | POA: Diagnosis not present

## 2017-11-22 DIAGNOSIS — L03032 Cellulitis of left toe: Secondary | ICD-10-CM | POA: Diagnosis not present

## 2017-11-25 ENCOUNTER — Ambulatory Visit
Admission: RE | Admit: 2017-11-25 | Discharge: 2017-11-25 | Disposition: A | Discharge status: Home / Self Care | Payer: Medicare HMO | Source: Ambulatory Visit | Attending: Family Medicine | Admitting: Family Medicine

## 2017-11-25 DIAGNOSIS — M5416 Radiculopathy, lumbar region: Secondary | ICD-10-CM | POA: Diagnosis not present

## 2017-11-25 DIAGNOSIS — M545 Low back pain: Secondary | ICD-10-CM

## 2017-12-07 DIAGNOSIS — G8929 Other chronic pain: Secondary | ICD-10-CM | POA: Diagnosis not present

## 2017-12-07 DIAGNOSIS — M545 Low back pain: Secondary | ICD-10-CM | POA: Diagnosis not present

## 2017-12-24 DIAGNOSIS — R69 Illness, unspecified: Secondary | ICD-10-CM | POA: Diagnosis not present

## 2018-01-02 DIAGNOSIS — E109 Type 1 diabetes mellitus without complications: Secondary | ICD-10-CM | POA: Diagnosis not present

## 2018-01-19 ENCOUNTER — Telehealth: Payer: Self-pay | Admitting: Endocrinology

## 2018-01-19 NOTE — Telephone Encounter (Signed)
Please advise 

## 2018-01-19 NOTE — Telephone Encounter (Signed)
Addressed in previously created encounter 

## 2018-01-19 NOTE — Telephone Encounter (Signed)
Called pt and made her aware of Dr. Cordelia Pen instructions. Verbalized acceptance and understanding.

## 2018-01-19 NOTE — Telephone Encounter (Signed)
Returned pt call on 1st phone # provided. LVM. Attempted to reach pt on second # and phone rang numerous times, then automated voice indicated call cannot be completed at this time, call back later.

## 2018-01-19 NOTE — Telephone Encounter (Signed)
Please continue 1 unit for each 100 by which your glucose exceeds 100.  You can take this as often as every hour.  Any reason you can think of why it is high, such as steroids?

## 2018-01-19 NOTE — Telephone Encounter (Signed)
Patient returned call. Please Advise, thanks

## 2018-01-19 NOTE — Telephone Encounter (Signed)
Returned pt call. States she is having sinus congestion. Denies fever and chills. Denies productive cough. Has purchased but not yet taken Alkaseltzer Plus Mucus and Congestion oral capsule.   CBG's: 30 minutes ago (12:30pm) 362 and last night at bedtime 440. Has taken 6.5 mL Novolog via "pod". Also requesting a refill of this medication.  Pt has been advised to call her PCP for evaluate for possible sinus infection.  Message sent to Dr. Loanne Drilling for further instructions.

## 2018-01-19 NOTE — Telephone Encounter (Signed)
Patient has been having allergies/sinus issues. Patient has been having high blood sugar for 3 days. Today patient's blood sugar is 215. I offered to schedule an appointment but patient prefers that Dr. Cordelia Pen Nurse call her at ph# [phone] (first) OR ph#  [phone] (second) to advise .

## 2018-01-20 DIAGNOSIS — J069 Acute upper respiratory infection, unspecified: Secondary | ICD-10-CM | POA: Diagnosis not present

## 2018-01-20 DIAGNOSIS — E1169 Type 2 diabetes mellitus with other specified complication: Secondary | ICD-10-CM | POA: Diagnosis not present

## 2018-01-23 ENCOUNTER — Telehealth: Payer: Self-pay | Admitting: Nutrition

## 2018-01-23 NOTE — Telephone Encounter (Signed)
Message was left on my machine that her blood sugars were high.  She was called today to see if they are coming down.  Patient reports that she is taking antibiotics from her primary care doctor, and mucinex, and that her blood sugar last night was 144 at HS, and 227 this AM fasting.   She was told to continue to do her corrections boluses per Dr. Cordelia Pen order.  She agreed to do this.

## 2018-01-30 ENCOUNTER — Telehealth: Payer: Self-pay | Admitting: Nutrition

## 2018-01-30 DIAGNOSIS — E1165 Type 2 diabetes mellitus with hyperglycemia: Secondary | ICD-10-CM | POA: Diagnosis not present

## 2018-01-30 NOTE — Telephone Encounter (Signed)
Patient reports that she finished her antibiotics yesterday, but still having runny nose and is stuffy.  Blood sugars are still very high.  FBS today was 291, yesterday: FBS 272, acL: 316, HS: 211.  Is doing correction boluses ac meals and at HS.

## 2018-01-30 NOTE — Telephone Encounter (Signed)
Please increase basal rate to 0.4 units/HR, 24 HRS per day. Continue to take the extra boluses as you have been doing. Please call or message [place] next week, to tell us how the blood sugar is doing. The illness will get better with time.

## 2018-01-30 NOTE — Telephone Encounter (Signed)
Patient talked into changing her basal rate to 0.4u/hr.

## 2018-03-07 ENCOUNTER — Telehealth: Payer: Self-pay | Admitting: Nutrition

## 2018-03-07 NOTE — Telephone Encounter (Signed)
Message left on machine that she needs help in resetting her basal rate from 0.4 to 0.3.  I phoned her and she says blood sugar is running around 130s-140s.  I told her that she should keep rate the same the same until she sees Dr. Loanne Drilling next week.  She agreed to do this.  She denies any low blood sugar readings.

## 2018-03-15 ENCOUNTER — Encounter: Payer: Self-pay | Admitting: Endocrinology

## 2018-03-15 ENCOUNTER — Ambulatory Visit: Payer: Medicare HMO | Admitting: Endocrinology

## 2018-03-15 VITALS — BP 122/72 | HR 82 | Ht 64.0 in | Wt 127.6 lb

## 2018-03-15 DIAGNOSIS — R69 Illness, unspecified: Secondary | ICD-10-CM | POA: Diagnosis not present

## 2018-03-15 DIAGNOSIS — E1065 Type 1 diabetes mellitus with hyperglycemia: Secondary | ICD-10-CM | POA: Diagnosis not present

## 2018-03-15 DIAGNOSIS — I10 Essential (primary) hypertension: Secondary | ICD-10-CM | POA: Diagnosis not present

## 2018-03-15 DIAGNOSIS — M810 Age-related osteoporosis without current pathological fracture: Secondary | ICD-10-CM | POA: Diagnosis not present

## 2018-03-15 DIAGNOSIS — E782 Mixed hyperlipidemia: Secondary | ICD-10-CM | POA: Diagnosis not present

## 2018-03-15 DIAGNOSIS — E1169 Type 2 diabetes mellitus with other specified complication: Secondary | ICD-10-CM | POA: Diagnosis not present

## 2018-03-15 DIAGNOSIS — Z0000 Encounter for general adult medical examination without abnormal findings: Secondary | ICD-10-CM | POA: Diagnosis not present

## 2018-03-15 DIAGNOSIS — G4700 Insomnia, unspecified: Secondary | ICD-10-CM | POA: Diagnosis not present

## 2018-03-15 DIAGNOSIS — M47816 Spondylosis without myelopathy or radiculopathy, lumbar region: Secondary | ICD-10-CM | POA: Diagnosis not present

## 2018-03-15 LAB — POCT GLYCOSYLATED HEMOGLOBIN (HGB A1C): HEMOGLOBIN A1C: 7 % — AB (ref 4.0–5.6)

## 2018-03-15 NOTE — Patient Instructions (Addendum)
Please continue these settings: basal rate of 0.35 units/hr.   mealtime bolus to 1 unit/ 40 grams carbohydrate, except please add 1 unit to your calculated breakfast bolus.   correction bolus (which some people call "sensitivity," or "insulin sensitivity ratio," or just "isr") of 1 unit for each 100 by which your glucose exceeds 150.   check your blood sugar 4 times a day: before the 3 meals, and at bedtime.  also check if you have symptoms of your blood sugar being too high or too low.  please keep a record of the readings and bring it to your next appointment here (or you can bring the meter itself).  You can write it on any piece of paper.  please call us sooner if your blood sugar goes below 70, or if you have a lot of readings over 200.  Please sign a release of information for the bone density.  Based on that, we can try the prior authorization for the Prolia.   Please come back for a follow-up appointment in 4 months.

## 2018-03-15 NOTE — Progress Notes (Signed)
Subjective:    Patient ID: Audrey [name], female    DOB: 09/27/1945, 73 y.o.   MRN: [id number]  HPI Pt returns for f/u of diabetes mellitus:  DM type: type 1 is dx'ed, based on lean body habitus and insulin sensitivity.   Dx'ed: 0488 Complications: none. Therapy: insulin since 2009-06-02.   GDM: never.   DKA: never. Severe hypoglycemia: never.  Pancreatitis: never.  Other: she has been on pump rx (now Omnipod) since early 2015-06-03; she has not yet started continuous glucose monitor.   Interval history: she has been on pump rx since early 06-03-2015.  She takes these settings:  basal rate of 0.4 units/hr (she increased with recent illness, and has not re-increased). mealtime bolus of 1 unit/ 40 grams carbohydrate.  She adds 1 unit to her calculated breakfast bolus.  correction bolus (which some people call "sensitivity," or "insulin sensitivity ratio," or just "isr") of 1 unit for each 100 by which your glucose exceeds 100.   Total daily dosage: 18 units per day.   Pt is referred by Dr Brigitte Pulse, for osteoporosis.  Pt was noted to have osteoporosis in 06/02/2006.  She took fosamax approx 2008-2013.  only bony fracture was right wrist 06-02-08).  She has no history of any of the following: early menopause, multiple myeloma, renal dz, thyroid problems, prolonged bedrest, steroids, alcoholism, smoking, liver dz, vid-d deficiency, or primary hyperparathyroidism.  She does not take heparin or anticonvulsants.  Prolia was advised.  Last DEXA was 06/02/2017.   Past Medical History:  Diagnosis Date  . Diabetes mellitus ([place])   . Hyperlipidemia   . Osteopenia   . Pericardial effusion   . PONV (postoperative nausea and vomiting)   . Seasonal allergies     Past Surgical History:  Procedure Laterality Date  . rectal polyps removed    . right wrist fx    . SUBXYPHOID PERICARDIAL WINDOW  02/18/2012   Procedure: SUBXYPHOID PERICARDIAL WINDOW;  Surgeon: Ivin Poot, MD;  Location: [facility] OR;  Service: Thoracic;  Laterality: N/A;   TEE    Social History   Socioeconomic History  . Marital status: Widowed    Spouse name: died 03-Jun-2011  . Number of children: 1  . Years of education: Not on file  . Highest education level: Not on file  Occupational History  . Occupation: Optician, dispensing top  Social Needs  . Financial resource strain: Not on file  . Food insecurity:    Worry: Not on file    Inability: Not on file  . Transportation needs:    Medical: Not on file    Non-medical: Not on file  Tobacco Use  . Smoking status: Never Smoker  . Smokeless tobacco: Never Used  Substance and Sexual Activity  . Alcohol use: No  . Drug use: No  . Sexual activity: Not on file  Lifestyle  . Physical activity:    Days per week: Not on file    Minutes per session: Not on file  . Stress: Not on file  Relationships  . Social connections:    Talks on phone: Not on file    Gets together: Not on file    Attends religious service: Not on file    Active member of club or organization: Not on file    Attends meetings of clubs or organizations: Not on file    Relationship status: Not on file  . Intimate partner violence:    Fear of current or ex partner: Not on  file    Emotionally abused: Not on file    Physically abused: Not on file    Forced sexual activity: Not on file  Other Topics Concern  . Not on file  Social History Narrative  . Not on file    Current Outpatient Medications on File Prior to Visit  Medication Sig Dispense Refill  . aspirin 81 MG tablet Take 81 mg by mouth every other day.     . Calcium Carb-Cholecalciferol (CALTRATE 600+D) 600-800 MG-UNIT TABS Take by mouth.    Marland Kitchen CINNAMON PO Take 1,000 mg by mouth.    . diphenhydrAMINE (BENADRYL) 25 mg capsule Take 25 mg by mouth every 6 (six) hours as needed.    . Insulin Infusion Pump Supplies (INSET INFUSION SET 23" 6MM) MISC 1 Device by Does not apply route every 3 (three) days.    . Insulin Infusion Pump Supplies (INSULIN PUMP SYRINGE RESERVOIR) MISC 1 Device  by Does not apply route every 3 (three) days. Animas 2 ml    . Lancets (ONETOUCH ULTRASOFT) lancets 1 each by Other route as needed for other. Use as instructed to test 4 times daily    . multivitamin-lutein (OCUVITE-LUTEIN) CAPS capsule Take 1 capsule by mouth daily.    . pravastatin (PRAVACHOL) 40 MG tablet Take 40 mg by mouth daily.    . sertraline (ZOLOFT) 100 MG tablet Take 50 mg by mouth daily.     . TURMERIC PO Take by mouth.    . vitamin C (ASCORBIC ACID) 500 MG tablet Take 500 mg by mouth daily.    Marland Kitchen ALPRAZolam (XANAX) 0.5 MG tablet Take 0.5 mg by mouth every 6 (six) hours as needed for anxiety.    . diazepam (VALIUM) 5 MG tablet Take 5 mg by mouth every 6 (six) hours as needed for anxiety.    Marland Kitchen escitalopram (LEXAPRO) 10 MG tablet Take 10 mg by mouth daily.    Marland Kitchen glucose blood (FREESTYLE LITE) test strip Use to check blood sugar 4 times daily Dx code- E10.65 (Patient not taking: Reported on 03/15/2018) 500 each 2  . insulin aspart (NOVOLOG) 100 UNIT/ML injection USE WITH INSULIN PUMP 40   UNITS DAILY (Patient not taking: Reported on 03/15/2018) 40 mL 2  . Lancets (FREESTYLE) lancets Use to check blood sugar 4 times per day. (Patient not taking: Reported on 03/15/2018) 400 each 5   No current facility-administered medications on file prior to visit.     Allergies  Allergen Reactions  . Morphine And Related   . Januvia [Sitagliptin]     Family History  Problem Relation Age of Onset  . Heart attack Father   . Diabetes Brother   . Heart disease Brother   . Cancer Sister        ovarian    BP 122/72 (BP Location: Left Arm, Patient Position: Sitting, Cuff Size: Normal)   Pulse 82   Ht 5' 4"  (1.626 m)   Wt 127 lb 9.6 oz (57.9 kg)   SpO2 96%   BMI 21.90 kg/m    Review of Systems She denies hypoglycemia.      Objective:   Physical Exam VITAL SIGNS:  See vs page GENERAL: no distress Pulses: dorsalis pedis intact bilat.   MSK: no deformity of the feet CV: no leg edema, but  there are bilat vv's. Skin:  no ulcer on the feet.  normal color and temp on the feet. Neuro: sensation is intact to touch on the feet Ext: left great toenail  is ingrown (s/p partial resection).    Lab Results  Component Value Date   HGBA1C 7.0 (A) 03/15/2018       Assessment & Plan:  Type 1 DM: well-controlled Osteoporosis, new to me, uncertain etiology.   Patient Instructions  Please continue these settings: basal rate of 0.35 units/hr.   mealtime bolus to 1 unit/ 40 grams carbohydrate, except please add 1 unit to your calculated breakfast bolus.   correction bolus (which some people call "sensitivity," or "insulin sensitivity ratio," or just "isr") of 1 unit for each 100 by which your glucose exceeds 150.   check your blood sugar 4 times a day: before the 3 meals, and at bedtime.  also check if you have symptoms of your blood sugar being too high or too low.  please keep a record of the readings and bring it to your next appointment here (or you can bring the meter itself).  You can write it on any piece of paper.  please call us sooner if your blood sugar goes below 70, or if you have a lot of readings over 200.  Please sign a release of information for the bone density.  Based on that, we can try the prior authorization for the Prolia.   Please come back for a follow-up appointment in 4 months.

## 2018-03-24 ENCOUNTER — Telehealth: Payer: Self-pay | Admitting: Endocrinology

## 2018-03-24 DIAGNOSIS — M810 Age-related osteoporosis without current pathological fracture: Secondary | ICD-10-CM

## 2018-03-24 NOTE — Telephone Encounter (Signed)
lft vm to return call 

## 2018-03-24 NOTE — Telephone Encounter (Signed)
02/09/17 DEXA: worse T-score was -3.2 (RTF)  please call patient: I reviewed bone density.  Please consider taking the prolia.  Let me know if you decide to take.

## 2018-03-27 DIAGNOSIS — M810 Age-related osteoporosis without current pathological fracture: Secondary | ICD-10-CM | POA: Insufficient documentation

## 2018-03-27 NOTE — Telephone Encounter (Signed)
Second attempt to reach pt remains unsuccessful. LVM requesting returned call.

## 2018-03-27 NOTE — Telephone Encounter (Signed)
Attempted to again reach pt. Informed of DEXA results. Interested in proceeding with [place]. Advised I would pass the message along to Dr. Loanne Drilling and Linus Galas, [place]. Verbalized acceptance and understanding.

## 2018-03-27 NOTE — Telephone Encounter (Signed)
OK, I added dx.

## 2018-03-27 NOTE — Telephone Encounter (Signed)
Please start PA process.  Thank you.

## 2018-03-27 NOTE — Telephone Encounter (Signed)
Please refer to Dr. Ellison's response 

## 2018-03-27 NOTE — Telephone Encounter (Signed)
Pt returned call. Attempted to return pt call but line was busy. Will attempt again

## 2018-03-27 NOTE — Telephone Encounter (Signed)
Patient has been added to the Prolia portal for insurance verification

## 2018-03-27 NOTE — Telephone Encounter (Signed)
Patient does not have Dx of osteoporosis in her chart I will need that to verify her please advise

## 2018-03-30 DIAGNOSIS — E109 Type 1 diabetes mellitus without complications: Secondary | ICD-10-CM | POA: Diagnosis not present

## 2018-04-30 DIAGNOSIS — E1165 Type 2 diabetes mellitus with hyperglycemia: Secondary | ICD-10-CM | POA: Diagnosis not present

## 2018-05-08 ENCOUNTER — Other Ambulatory Visit: Payer: Self-pay | Admitting: Endocrinology

## 2018-06-27 DIAGNOSIS — E109 Type 1 diabetes mellitus without complications: Secondary | ICD-10-CM | POA: Diagnosis not present

## 2018-06-28 NOTE — Telephone Encounter (Signed)
PA forms for prolia placed on Dr. Cordelia Pen desk

## 2018-07-03 ENCOUNTER — Telehealth: Payer: Self-pay

## 2018-07-03 NOTE — Telephone Encounter (Signed)
New Prolia PA from [facility] on Dr. Cordelia Pen desk waiting for signature

## 2018-07-04 ENCOUNTER — Telehealth: Payer: Self-pay | Admitting: Endocrinology

## 2018-07-04 NOTE — Telephone Encounter (Signed)
In order to to PA, I need [facility] result [facility] from 2019.  Thank you.

## 2018-07-04 NOTE — Telephone Encounter (Signed)
Called Dr. Raul Del office to request copy of pt most recent DEXA. States will fax.

## 2018-07-28 DIAGNOSIS — E1165 Type 2 diabetes mellitus with hyperglycemia: Secondary | ICD-10-CM | POA: Diagnosis not present

## 2018-08-30 ENCOUNTER — Telehealth: Payer: Self-pay

## 2018-08-30 NOTE — Telephone Encounter (Addendum)
LVM requesting patient call back to schedule nurse visit for Prolia- patient was approved by [employer]-Auth # [id number] approved from 08/28/18 to 08/28/19-owes $255 at check-in

## 2018-09-05 DIAGNOSIS — R69 Illness, unspecified: Secondary | ICD-10-CM | POA: Diagnosis not present

## 2018-09-13 ENCOUNTER — Ambulatory Visit: Payer: Medicare HMO | Admitting: Endocrinology

## 2018-09-13 ENCOUNTER — Encounter: Payer: Medicare HMO | Attending: Endocrinology | Admitting: Nutrition

## 2018-09-13 ENCOUNTER — Encounter: Payer: Self-pay | Admitting: Endocrinology

## 2018-09-13 ENCOUNTER — Other Ambulatory Visit: Payer: Self-pay

## 2018-09-13 VITALS — BP 152/82 | HR 77 | Ht 64.0 in | Wt 121.2 lb

## 2018-09-13 DIAGNOSIS — E119 Type 2 diabetes mellitus without complications: Secondary | ICD-10-CM | POA: Insufficient documentation

## 2018-09-13 DIAGNOSIS — Z794 Long term (current) use of insulin: Secondary | ICD-10-CM | POA: Diagnosis not present

## 2018-09-13 DIAGNOSIS — IMO0001 Reserved for inherently not codable concepts without codable children: Secondary | ICD-10-CM

## 2018-09-13 DIAGNOSIS — M810 Age-related osteoporosis without current pathological fracture: Secondary | ICD-10-CM | POA: Diagnosis not present

## 2018-09-13 DIAGNOSIS — Z1231 Encounter for screening mammogram for malignant neoplasm of breast: Secondary | ICD-10-CM | POA: Diagnosis not present

## 2018-09-13 DIAGNOSIS — E1065 Type 1 diabetes mellitus with hyperglycemia: Secondary | ICD-10-CM | POA: Diagnosis not present

## 2018-09-13 LAB — POCT GLYCOSYLATED HEMOGLOBIN (HGB A1C): Hemoglobin A1C: 6.7 % — AB (ref 4.0–5.6)

## 2018-09-13 MED ORDER — DENOSUMAB 60 MG/ML ~~LOC~~ SOSY
60.0000 mg | PREFILLED_SYRINGE | Freq: Once | SUBCUTANEOUS | Status: AC
  Administered 2018-09-13: 10:00:00 60 mg via SUBCUTANEOUS

## 2018-09-13 NOTE — Progress Notes (Signed)
Per orders of Dr. Ellison injection of Prolia given SQ in L upper outer aspect of arm today by A. Stonewall Doss, LPN . Denies any adverse reactions or discomfort.  

## 2018-09-13 NOTE — Progress Notes (Signed)
   Subjective:    Patient ID: Audrey [name], female    DOB: Oct 02, 1945, 73 y.o.   MRN: [id number]  HPI Pt returns for f/u of diabetes mellitus:  DM type: type 1 is dx'ed, based on lean body habitus and insulin sensitivity.   Dx'ed: 9562 Complications: none. Therapy: insulin since 2011, and pump rx since 2017 GDM: never.   DKA: never. Severe hypoglycemia: never.  Pancreatitis: never.  Other: she has been on pump rx (now Omnipod) since early 2017; she has not yet started continuous glucose monitor.   Interval history: She takes these settings:  basal rate of 0.35 units/hr.   mealtime bolus to 1 unit/ 40 grams carbohydrate, except please add 1 unit to your calculated breakfast bolus.   correction bolus (which some people call "sensitivity," or "insulin sensitivity ratio," or just "isr") of 1 unit for each 100 by which your glucose exceeds 150.  She says she has mild hypoglycemia approx once per week.  This happens with exercise.  no cbg record, but states cbg's vary from 60-519. There is no trend throughout the day.    Pt returns for f/u of osteoporosis: Dx'ed: 2009 Secondary causes: none Fractures: right wrist (2010 Past rx: fosamax approx 2008-2013 Current rx: prolia since 2019 Last DEXA result: 2019 was requested Other: none Interval hx: she tolerate prolia well     Review of Systems Denies LOC.    Objective:   Physical Exam VITAL SIGNS:  See vs page GENERAL: no distress Pulses: dorsalis pedis intact bilat.   MSK: no deformity of the feet CV: no leg edema Skin:  no ulcer on the feet.  normal color and temp on the feet. Neuro: sensation is intact to touch on the feet Ext: there is bilateral onychomycosis of the toenails.     Lab Results  Component Value Date   HGBA1C 6.7 (A) 09/13/2018       Assessment & Plan:  Type 1 DM: well-controlled Osteoporosis: Please continue the same medication Hypoglycemia: this limits aggressiveness of glycemic control  Patient  Instructions  Please continue these settings: basal rate of 0.35 units/hr.   mealtime bolus to 1 unit/ 40 grams carbohydrate, except please add 1 unit to your calculated breakfast bolus.   correction bolus (which some people call "sensitivity," or "insulin sensitivity ratio," or just "isr") of 1 unit for each 100 by which your glucose exceeds 150.   Please suspend the pump for approx 1 hour, for exercise. check your blood sugar 4 times a day: before the 3 meals, and at bedtime.  also check if you have symptoms of your blood sugar being too high or too low.  please keep a record of the readings and bring it to your next appointment here (or you can bring the meter itself).  You can write it on any piece of paper.  please call us sooner if your blood sugar goes below 70, or if you have a lot of readings over 200.   Please come back for a follow-up appointment in 3-4 months.

## 2018-09-13 NOTE — Patient Instructions (Addendum)
Please continue these settings: basal rate of 0.35 units/hr.   mealtime bolus to 1 unit/ 40 grams carbohydrate, except please add 1 unit to your calculated breakfast bolus.   correction bolus (which some people call "sensitivity," or "insulin sensitivity ratio," or just "isr") of 1 unit for each 100 by which your glucose exceeds 150.   Please suspend the pump for approx 1 hour, for exercise. check your blood sugar 4 times a day: before the 3 meals, and at bedtime.  also check if you have symptoms of your blood sugar being too high or too low.  please keep a record of the readings and bring it to your next appointment here (or you can bring the meter itself).  You can write it on any piece of paper.  please call us sooner if your blood sugar goes below 70, or if you have a lot of readings over 200.   Please come back for a follow-up appointment in 3-4 months.

## 2018-09-18 NOTE — Progress Notes (Signed)
Patient is here today because she wants to review  how to put her pump in stop and then to restart when she walks.  She is walking now, and dropping low sometimes before or shortly after getting back from her 30 min. Walk.   She was shown how to do this, and written steps were given to her for this. She re demonstrated this correctly, after reading the steps.   She had no final questions.

## 2018-09-18 NOTE — Patient Instructions (Signed)
Please follow the steps written, and put your pump in stop about 10 min. Before going for your walk Put the pump back in the run mode when you return.   If blood sugars still drop low, call the office.

## 2018-09-22 DIAGNOSIS — E109 Type 1 diabetes mellitus without complications: Secondary | ICD-10-CM | POA: Diagnosis not present

## 2018-10-16 DIAGNOSIS — E782 Mixed hyperlipidemia: Secondary | ICD-10-CM | POA: Diagnosis not present

## 2018-10-16 DIAGNOSIS — M5136 Other intervertebral disc degeneration, lumbar region: Secondary | ICD-10-CM | POA: Diagnosis not present

## 2018-10-16 DIAGNOSIS — R69 Illness, unspecified: Secondary | ICD-10-CM | POA: Diagnosis not present

## 2018-10-16 DIAGNOSIS — M810 Age-related osteoporosis without current pathological fracture: Secondary | ICD-10-CM | POA: Diagnosis not present

## 2018-10-16 DIAGNOSIS — Z7189 Other specified counseling: Secondary | ICD-10-CM | POA: Diagnosis not present

## 2018-10-16 DIAGNOSIS — I10 Essential (primary) hypertension: Secondary | ICD-10-CM | POA: Diagnosis not present

## 2018-10-16 DIAGNOSIS — E1169 Type 2 diabetes mellitus with other specified complication: Secondary | ICD-10-CM | POA: Diagnosis not present

## 2018-10-18 ENCOUNTER — Telehealth: Payer: Self-pay | Admitting: Endocrinology

## 2018-10-18 NOTE — Telephone Encounter (Signed)
Audrey [name],  Patient has scheduled a follow up appointment with Dr Loanne Drilling on April 03, 2018 at 10:15 AM and would to get her Prolia on that day if possible.  Wanted me to send you a message with this request.  Thanks,

## 2018-10-19 NOTE — Telephone Encounter (Signed)
Spoke with the patient and rescheduled her appointment for 03/19/19 to have OV with Dr. Loanne Drilling and get her Prolia injection

## 2018-10-19 NOTE — Telephone Encounter (Signed)
Called patient and left her a voice mail requesting she call the office- patient has her Prolia on 09/13/18 so I will attempt to reschedule her f/u with Dr. Loanne Drilling closer to 03/16/18 to keep patient compliant

## 2018-10-26 DIAGNOSIS — E1165 Type 2 diabetes mellitus with hyperglycemia: Secondary | ICD-10-CM | POA: Diagnosis not present

## 2018-11-01 DIAGNOSIS — I10 Essential (primary) hypertension: Secondary | ICD-10-CM | POA: Diagnosis not present

## 2018-11-01 DIAGNOSIS — E782 Mixed hyperlipidemia: Secondary | ICD-10-CM | POA: Diagnosis not present

## 2018-12-21 DIAGNOSIS — E11319 Type 2 diabetes mellitus with unspecified diabetic retinopathy without macular edema: Secondary | ICD-10-CM | POA: Diagnosis not present

## 2018-12-21 DIAGNOSIS — H524 Presbyopia: Secondary | ICD-10-CM | POA: Diagnosis not present

## 2018-12-21 DIAGNOSIS — R69 Illness, unspecified: Secondary | ICD-10-CM | POA: Diagnosis not present

## 2018-12-21 DIAGNOSIS — E109 Type 1 diabetes mellitus without complications: Secondary | ICD-10-CM | POA: Diagnosis not present

## 2019-01-24 DIAGNOSIS — E1165 Type 2 diabetes mellitus with hyperglycemia: Secondary | ICD-10-CM | POA: Diagnosis not present

## 2019-03-14 ENCOUNTER — Telehealth: Payer: Self-pay

## 2019-03-14 NOTE — Telephone Encounter (Signed)
Company: [employer]  Document: DWO for testing supplies Other records requested: None requested  All above requested information has been faxed successfully to [employer] listed above. Documents and fax confirmation have been placed in the faxed file for future reference.

## 2019-03-15 ENCOUNTER — Other Ambulatory Visit: Payer: Self-pay

## 2019-03-19 ENCOUNTER — Telehealth: Payer: Self-pay

## 2019-03-19 ENCOUNTER — Ambulatory Visit (INDEPENDENT_AMBULATORY_CARE_PROVIDER_SITE_OTHER): Payer: Medicare HMO | Admitting: Endocrinology

## 2019-03-19 ENCOUNTER — Encounter: Payer: Self-pay | Admitting: Endocrinology

## 2019-03-19 VITALS — BP 152/80 | HR 98 | Ht 64.0 in | Wt 118.4 lb

## 2019-03-19 DIAGNOSIS — E1065 Type 1 diabetes mellitus with hyperglycemia: Secondary | ICD-10-CM | POA: Diagnosis not present

## 2019-03-19 DIAGNOSIS — E1169 Type 2 diabetes mellitus with other specified complication: Secondary | ICD-10-CM | POA: Diagnosis not present

## 2019-03-19 DIAGNOSIS — E782 Mixed hyperlipidemia: Secondary | ICD-10-CM | POA: Diagnosis not present

## 2019-03-19 LAB — POCT GLYCOSYLATED HEMOGLOBIN (HGB A1C): Hemoglobin A1C: 6.7 % — AB (ref 4.0–5.6)

## 2019-03-19 NOTE — Telephone Encounter (Signed)
Patient came into office today thinking she could have her prolia injection done today. Wasn't able to due to PA    Please advise

## 2019-03-19 NOTE — Patient Instructions (Addendum)
Your blood pressure is high today.  Please see your primary care provider soon, to have it rechecked Please continue these settings: basal rate of 0.30 units/hr.   mealtime bolus to 1 unit/ 40 grams carbohydrate, except please add 1 unit to your calculated breakfast bolus.   correction bolus (which some people call "sensitivity," or "insulin sensitivity ratio," or just "isr") of 1 unit for each 100 by which your glucose exceeds 150.   Please suspend the pump for approx 1 hour, for exercise.  check your blood sugar 4 times a day: before the 3 meals, and at bedtime.  also check if you have symptoms of your blood sugar being too high or too low.  please keep a record of the readings and bring it to your next appointment here (or you can bring the meter itself).  You can write it on any piece of paper.  please call us sooner if your blood sugar goes below 70, or if you have a lot of readings over 200.   Please come back for a follow-up appointment in 3-4 months.

## 2019-03-19 NOTE — Progress Notes (Signed)
Subjective:    Patient ID: Audrey [name], female    DOB: 1946-01-26, 74 y.o.   MRN: [id number]  HPI Pt returns for f/u of diabetes mellitus:  DM type: type 1 is dx'ed, based on lean body habitus and insulin sensitivity.   Dx'ed: [id number] Complications: none. Therapy: insulin since June 15, 2009, and pump rx since 06-16-15 GDM: never.   DKA: never. Severe hypoglycemia: never.  Pancreatitis: never.  Other: she has been on pump rx (now Omnipod) since early 06-16-15; she has not yet started continuous glucose monitor.   Interval history: She takes these settings:  basal rate of 0.35 units/hr.   mealtime bolus to 1 unit/ 40 grams carbohydrate, except please add 1 unit to your calculated breakfast bolus.   correction bolus (which some people call "sensitivity," or "insulin sensitivity ratio," or just "isr") of 1 unit for each 100 by which your glucose exceeds 150.   She says she has mild hypoglycemia approx twice per month.  This happens with exercise.  no cbg record, but states cbg's vary from 35-600. There is no trend throughout the day.   TDD is approx 20 units per day.    Pt returns for f/u of osteoporosis: Dx'ed: 2007-06-16 Secondary causes: none Fractures: right wrist 2008-06-15) Past rx: fosamax approx 2008-2013 Current rx: prolia since 2017/06/15 Last DEXA result: 15-Jun-2017 was requested Other: none Interval hx: she tolerates prolia well.   Past Medical History:  Diagnosis Date  . Diabetes mellitus ([place])   . Hyperlipidemia   . Osteopenia   . Pericardial effusion   . PONV (postoperative nausea and vomiting)   . Seasonal allergies     Past Surgical History:  Procedure Laterality Date  . rectal polyps removed    . right wrist fx    . SUBXYPHOID PERICARDIAL WINDOW  02/18/2012   Procedure: SUBXYPHOID PERICARDIAL WINDOW;  Surgeon: Ivin Poot, MD;  Location: [facility] OR;  Service: Thoracic;  Laterality: N/A;  TEE    Social History   Socioeconomic History  . Marital status: Widowed    Spouse name: died 06/16/2011   . Number of children: 1  . Years of education: Not on file  . Highest education level: Not on file  Occupational History  . Occupation: Optician, dispensing top  Tobacco Use  . Smoking status: Never Smoker  . Smokeless tobacco: Never Used  Substance and Sexual Activity  . Alcohol use: No  . Drug use: No  . Sexual activity: Not on file  Other Topics Concern  . Not on file  Social History Narrative  . Not on file   Social Determinants of Health   Financial Resource Strain:   . Difficulty of Paying Living Expenses: Not on file  Food Insecurity:   . Worried About Charity fundraiser in the Last Year: Not on file  . Ran Out of Food in the Last Year: Not on file  Transportation Needs:   . Lack of Transportation (Medical): Not on file  . Lack of Transportation (Non-Medical): Not on file  Physical Activity:   . Days of Exercise per Week: Not on file  . Minutes of Exercise per Session: Not on file  Stress:   . Feeling of Stress : Not on file  Social Connections:   . Frequency of Communication with Friends and Family: Not on file  . Frequency of Social Gatherings with Friends and Family: Not on file  . Attends Religious Services: Not on file  . Active Member of Clubs or  Organizations: Not on file  . Attends Archivist Meetings: Not on file  . Marital Status: Not on file  Intimate Partner Violence:   . Fear of Current or Ex-Partner: Not on file  . Emotionally Abused: Not on file  . Physically Abused: Not on file  . Sexually Abused: Not on file    Current Outpatient Medications on File Prior to Visit  Medication Sig Dispense Refill  . ALPRAZolam (XANAX) 0.5 MG tablet Take 0.5 mg by mouth every 6 (six) hours as needed for anxiety.    Marland Kitchen aspirin 81 MG tablet Take 81 mg by mouth every other day.     . Calcium Carb-Cholecalciferol (CALTRATE 600+D) 600-800 MG-UNIT TABS Take by mouth.    Marland Kitchen CINNAMON PO Take 1,000 mg by mouth.    . diazepam (VALIUM) 5 MG tablet Take 5 mg by mouth  every 6 (six) hours as needed for anxiety.    . diphenhydrAMINE (BENADRYL) 25 mg capsule Take 25 mg by mouth every 6 (six) hours as needed.    Marland Kitchen escitalopram (LEXAPRO) 10 MG tablet Take 10 mg by mouth daily.    Marland Kitchen glucose blood (FREESTYLE LITE) test strip Use to check blood sugar 4 times daily Dx code- E10.65 500 each 2  . insulin aspart (NOVOLOG) 100 UNIT/ML injection USE WITH INSULIN PUMP 40   UNITS DAILY 40 mL 2  . Insulin Infusion Pump Supplies (INSET INFUSION SET 23" 6MM) MISC 1 Device by Does not apply route every 3 (three) days.    . Insulin Infusion Pump Supplies (INSULIN PUMP SYRINGE RESERVOIR) MISC 1 Device by Does not apply route every 3 (three) days. Animas 2 ml    . Lancets (FREESTYLE) lancets Use to check blood sugar 4 times per day. 400 each 5  . Lancets (ONETOUCH ULTRASOFT) lancets 1 each by Other route as needed for other. Use as instructed to test 4 times daily    . multivitamin-lutein (OCUVITE-LUTEIN) CAPS capsule Take 1 capsule by mouth daily.    . pravastatin (PRAVACHOL) 40 MG tablet Take 40 mg by mouth daily.    . sertraline (ZOLOFT) 100 MG tablet Take 50 mg by mouth daily.     . TURMERIC PO Take by mouth.    . vitamin C (ASCORBIC ACID) 500 MG tablet Take 500 mg by mouth daily.     No current facility-administered medications on file prior to visit.    Allergies  Allergen Reactions  . Morphine And Related   . Januvia [Sitagliptin]     Family History  Problem Relation Age of Onset  . Heart attack Father   . Diabetes Brother   . Heart disease Brother   . Cancer Sister        ovarian    BP (!) 152/80 (BP Location: Left Arm, Patient Position: Sitting, Cuff Size: Normal)   Pulse 98   Ht 5\' 4"  (1.626 m)   Wt 118 lb 6.4 oz (53.7 kg)   SpO2 98%   BMI 20.32 kg/m    Review of Systems Denies LOC    Objective:   Physical Exam VITAL SIGNS:  See vs page GENERAL: no distress Pulses: dorsalis pedis intact bilat.   MSK: no deformity of the feet CV: trace bilat  leg edema, and bilat vv's Skin:  no ulcer on the feet.  normal color and temp on the feet. Neuro: sensation is intact to touch on the feet   Lab Results  Component Value Date   HGBA1C 6.7 (A)  03/19/2019   outside test results are reviewed: Ca++=9.2    Assessment & Plan:  HTN: is noted today Type 1 DM: overcontrolled Osteoporosis: due for a Prolia shot.  We'll do PA for this.  Ca++ is mid-normal, so we'll skip other labs for now.    Patient Instructions  Your blood pressure is high today.  Please see your primary care provider soon, to have it rechecked Please continue these settings: basal rate of 0.30 units/hr.   mealtime bolus to 1 unit/ 40 grams carbohydrate, except please add 1 unit to your calculated breakfast bolus.   correction bolus (which some people call "sensitivity," or "insulin sensitivity ratio," or just "isr") of 1 unit for each 100 by which your glucose exceeds 150.   Please suspend the pump for approx 1 hour, for exercise.  check your blood sugar 4 times a day: before the 3 meals, and at bedtime.  also check if you have symptoms of your blood sugar being too high or too low.  please keep a record of the readings and bring it to your next appointment here (or you can bring the meter itself).  You can write it on any piece of paper.  please call us sooner if your blood sugar goes below 70, or if you have a lot of readings over 200.   Please come back for a follow-up appointment in 3-4 months.

## 2019-03-20 DIAGNOSIS — E109 Type 1 diabetes mellitus without complications: Secondary | ICD-10-CM | POA: Diagnosis not present

## 2019-03-20 NOTE — Telephone Encounter (Signed)
PA for Prolia is good thru 08/28/19- scheduled nurse visit for this patient

## 2019-03-21 ENCOUNTER — Other Ambulatory Visit: Payer: Self-pay

## 2019-03-21 ENCOUNTER — Ambulatory Visit: Payer: Medicare HMO

## 2019-03-21 DIAGNOSIS — R69 Illness, unspecified: Secondary | ICD-10-CM | POA: Diagnosis not present

## 2019-03-21 DIAGNOSIS — M810 Age-related osteoporosis without current pathological fracture: Secondary | ICD-10-CM

## 2019-03-21 MED ORDER — DENOSUMAB 60 MG/ML ~~LOC~~ SOSY
60.0000 mg | PREFILLED_SYRINGE | Freq: Once | SUBCUTANEOUS | Status: AC
  Administered 2019-03-21: 14:00:00 60 mg via SUBCUTANEOUS

## 2019-03-21 NOTE — Telephone Encounter (Signed)
Patient owes $255 at check-in for Prolia

## 2019-03-21 NOTE — Progress Notes (Signed)
Per orders of Dr. Ellison injection of Prolia given today by Sherif Millspaugh, Certified Medical Assistant . Patient tolerated injection well.  

## 2019-03-28 ENCOUNTER — Ambulatory Visit: Payer: Medicare HMO

## 2019-04-04 ENCOUNTER — Ambulatory Visit: Payer: Medicare HMO | Admitting: Endocrinology

## 2019-04-04 DIAGNOSIS — M810 Age-related osteoporosis without current pathological fracture: Secondary | ICD-10-CM | POA: Diagnosis not present

## 2019-04-04 DIAGNOSIS — R69 Illness, unspecified: Secondary | ICD-10-CM | POA: Diagnosis not present

## 2019-04-04 DIAGNOSIS — I10 Essential (primary) hypertension: Secondary | ICD-10-CM | POA: Diagnosis not present

## 2019-04-04 DIAGNOSIS — M47816 Spondylosis without myelopathy or radiculopathy, lumbar region: Secondary | ICD-10-CM | POA: Diagnosis not present

## 2019-04-04 DIAGNOSIS — E46 Unspecified protein-calorie malnutrition: Secondary | ICD-10-CM | POA: Diagnosis not present

## 2019-04-04 DIAGNOSIS — G4700 Insomnia, unspecified: Secondary | ICD-10-CM | POA: Diagnosis not present

## 2019-04-04 DIAGNOSIS — E1169 Type 2 diabetes mellitus with other specified complication: Secondary | ICD-10-CM | POA: Diagnosis not present

## 2019-04-04 DIAGNOSIS — Z0000 Encounter for general adult medical examination without abnormal findings: Secondary | ICD-10-CM | POA: Diagnosis not present

## 2019-04-04 DIAGNOSIS — E782 Mixed hyperlipidemia: Secondary | ICD-10-CM | POA: Diagnosis not present

## 2019-04-24 DIAGNOSIS — E1165 Type 2 diabetes mellitus with hyperglycemia: Secondary | ICD-10-CM | POA: Diagnosis not present

## 2019-05-01 ENCOUNTER — Telehealth: Payer: Self-pay | Admitting: Nutrition

## 2019-05-01 NOTE — Telephone Encounter (Signed)
See primary care provider, or urgent care ASAP

## 2019-05-01 NOTE — Telephone Encounter (Signed)
Called pt and informed her of Dr. Cordelia Pen advice documented below. Verbalized acceptance and understanding.

## 2019-05-01 NOTE — Telephone Encounter (Signed)
Patient called saying that she has 3 toes that are very red.  Denies any discharge, or break in the skin.  Said one toe had a blister with some discharge a week ago, but it has healed, but still red.  One toes is still swollen.  Not hot to the touch.  She sees a Art therapist in [place], and wants to know what she should do.  I told her to call them for an appointment ASAP, and she agreed to do this.  She called me right back, saying they can not see her until 1 week from this Thursday.   Please advise

## 2019-05-03 DIAGNOSIS — B351 Tinea unguium: Secondary | ICD-10-CM | POA: Diagnosis not present

## 2019-05-03 DIAGNOSIS — E1142 Type 2 diabetes mellitus with diabetic polyneuropathy: Secondary | ICD-10-CM | POA: Diagnosis not present

## 2019-05-03 DIAGNOSIS — M79676 Pain in unspecified toe(s): Secondary | ICD-10-CM | POA: Diagnosis not present

## 2019-05-07 ENCOUNTER — Telehealth: Payer: Self-pay | Admitting: Nutrition

## 2019-05-07 NOTE — Telephone Encounter (Signed)
Patient said that she recalled her foot doctor and insisted to be seen last Thursday.  She had 2 ingrown toenails.  He advised her to not trim her own nails, and to see him for this.  He gave her some "savage" to put on in twice daily, and the redness and soreness are "mostly gone.

## 2019-05-10 ENCOUNTER — Other Ambulatory Visit: Payer: Self-pay | Admitting: Endocrinology

## 2019-06-15 DIAGNOSIS — E109 Type 1 diabetes mellitus without complications: Secondary | ICD-10-CM | POA: Diagnosis not present

## 2019-07-18 ENCOUNTER — Ambulatory Visit: Payer: Medicare HMO | Admitting: Endocrinology

## 2019-07-23 ENCOUNTER — Telehealth: Payer: Self-pay | Admitting: Endocrinology

## 2019-07-23 ENCOUNTER — Other Ambulatory Visit: Payer: Self-pay | Admitting: Endocrinology

## 2019-07-23 ENCOUNTER — Other Ambulatory Visit: Payer: Self-pay

## 2019-07-23 MED ORDER — ONETOUCH ULTRA VI STRP: 1.0000 | ORAL_STRIP | Freq: Four times a day (QID) | 12 refills | Status: DC

## 2019-07-23 NOTE — Telephone Encounter (Signed)
Please advise as this is a NEW Rx request

## 2019-07-23 NOTE — Telephone Encounter (Signed)
NEW RX for One Touch Ultra Test Strips  Medication Refill Request  Did you call your pharmacy and request this refill first?No-not a refill-new RX . If patient has not contacted pharmacy first, instruct them to do so for future refills.  . Remind them that contacting the pharmacy for their refill is the quickest method to get the refill.  . Refill policy also stated that it will take anywhere between 24-72 hours to receive the refill.    Name of medication? One Touch Ultra Test Strips  Is this a 90 day supply? Yes  Name and location of pharmacy? Patient not sure but thinks it is:  [facility] [place] [facility] [address] [place] Phone:  [phone]  Fax:  [phone]      . Is the request for diabetes test strips? Yes . If yes, what brand? One Touch Ultra

## 2019-07-23 NOTE — Telephone Encounter (Signed)
Ok, I have sent a prescription to your pharmacy,  When we know date for Prolia, please sched f/u the same day.

## 2019-07-23 NOTE — Telephone Encounter (Signed)
Called pt to schedule f/u appt per Dr. Cordelia Pen request. Pt declined to schedule appt stating, "I would like to come in to have my Prolia on the same day as I see Dr. Loanne Drilling". Reminded pt of the following:  Refill request is a NEW request which will require an appt. Aware this refill remains ON HOLD pending appt with Dr. Loanne Drilling. Unable to determine the current auth or scheduling process of Prolia. This message will be forwarded to Wyatt Haste, LPN for him to follow on the status of her auth as well as scheduling of Prolia once approved.  Olen Cordial - Once Prolia has been authorized and we are able to proceed with scheduling the injection, pt will also require an appt with Dr. Loanne Drilling to discuss NEW refill request and to receive Prolia on the same day.A separate nurse visit appt will not be required.

## 2019-07-23 NOTE — Telephone Encounter (Signed)
F/u is due.  Let's address then

## 2019-07-24 ENCOUNTER — Telehealth: Payer: Self-pay

## 2019-07-24 ENCOUNTER — Other Ambulatory Visit: Payer: Self-pay

## 2019-07-24 DIAGNOSIS — R69 Illness, unspecified: Secondary | ICD-10-CM | POA: Diagnosis not present

## 2019-07-24 DIAGNOSIS — E1065 Type 1 diabetes mellitus with hyperglycemia: Secondary | ICD-10-CM

## 2019-07-24 MED ORDER — ONETOUCH ULTRASOFT LANCETS MISC: 1.0000 | Freq: Four times a day (QID) | 0 refills | Status: AC

## 2019-07-24 MED ORDER — ONETOUCH ULTRA VI STRP: 1.0000 | ORAL_STRIP | Freq: Four times a day (QID) | 0 refills | Status: DC

## 2019-07-24 NOTE — Telephone Encounter (Signed)
Patient is not due for Prolia until July.

## 2019-07-24 NOTE — Telephone Encounter (Signed)
Received notification from [place] that Medicare benefit plan will only cover test strips TID. PA initiated per their request.   FAXED [place]: [facility]  Document: PA Form - One touch test strips Other records requested: None  All above requested information has been faxed successfully to [employer] listed above. Documents and fax confirmation have been placed in the faxed file for future reference.

## 2019-07-24 NOTE — Telephone Encounter (Signed)
You wanted to address the NEW refill request for Onetouch. Will this also wait until July?

## 2019-07-24 NOTE — Telephone Encounter (Signed)
Yes, that meter and strips are OK

## 2019-07-24 NOTE — Telephone Encounter (Signed)
Ok to wait for appt until then

## 2019-07-27 DIAGNOSIS — E1165 Type 2 diabetes mellitus with hyperglycemia: Secondary | ICD-10-CM | POA: Diagnosis not present

## 2019-08-01 DIAGNOSIS — R69 Illness, unspecified: Secondary | ICD-10-CM | POA: Diagnosis not present

## 2019-08-01 DIAGNOSIS — M810 Age-related osteoporosis without current pathological fracture: Secondary | ICD-10-CM | POA: Diagnosis not present

## 2019-08-01 DIAGNOSIS — M2550 Pain in unspecified joint: Secondary | ICD-10-CM | POA: Diagnosis not present

## 2019-08-01 DIAGNOSIS — G8929 Other chronic pain: Secondary | ICD-10-CM | POA: Diagnosis not present

## 2019-08-01 DIAGNOSIS — J309 Allergic rhinitis, unspecified: Secondary | ICD-10-CM | POA: Diagnosis not present

## 2019-08-01 DIAGNOSIS — E785 Hyperlipidemia, unspecified: Secondary | ICD-10-CM | POA: Diagnosis not present

## 2019-08-01 DIAGNOSIS — I10 Essential (primary) hypertension: Secondary | ICD-10-CM | POA: Diagnosis not present

## 2019-08-01 DIAGNOSIS — E119 Type 2 diabetes mellitus without complications: Secondary | ICD-10-CM | POA: Diagnosis not present

## 2019-08-01 DIAGNOSIS — Z794 Long term (current) use of insulin: Secondary | ICD-10-CM | POA: Diagnosis not present

## 2019-08-16 DIAGNOSIS — L84 Corns and callosities: Secondary | ICD-10-CM | POA: Diagnosis not present

## 2019-08-16 DIAGNOSIS — B351 Tinea unguium: Secondary | ICD-10-CM | POA: Diagnosis not present

## 2019-08-16 DIAGNOSIS — E1142 Type 2 diabetes mellitus with diabetic polyneuropathy: Secondary | ICD-10-CM | POA: Diagnosis not present

## 2019-08-16 DIAGNOSIS — M79676 Pain in unspecified toe(s): Secondary | ICD-10-CM | POA: Diagnosis not present

## 2019-08-24 ENCOUNTER — Telehealth: Payer: Self-pay

## 2019-08-24 NOTE — Telephone Encounter (Signed)
According to Summary of benefits, patient's insruance does require a PA for Prolia, but a PA has been authorized and is on file. It is valid from 08/28/18 through 08/28/19.  PT is not eligible for injection until 09/19/19 so a new PA will be initiated.   Pt does owe 20% of cost of drug, which is $480.  PA submitted via [email or web address] for Prolia injection.  Dariona Butzer Key: [id number] help? Call us at [phone] Outcome Additional Information Required The patient currently has access to the requested medication and a Prior Authorization is not needed for the patient/medication. Drug Prolia 60MG /ML syringes Form [employer] Electronic PA Form (2017 NCPDP)  [employer] Medicare Part D is unable to respond with clinical questions. Please see more information at the bottom of the page for next steps.

## 2019-08-29 NOTE — Telephone Encounter (Signed)
Correction: Patient will owe $265.00 and will need to contact insurance to verify whether PA is needed or not. Summary of benefits states yes, while PA initiated via [email or web address] states that no PA is required. Will follow up with insurance.

## 2019-09-03 NOTE — Telephone Encounter (Signed)
Called [employer] to confirm whether PA is required or not. PA is required. After PA is approved, pt owes 20%. Reference #:[id number] Name of rep is Sheryl T.  After confirming this, I transferred to PA dept to complete PA over the phone.  Initial PA was completed with reference number to that being: [id number] and reps name was Cristy Folks.  After speaking with her, I was then transferred to another clinical dept to confirm and complete this information. The next rep spoke to was Doris A. She confirmed all the information previously submitted, and the PA has been approved from 09/03/19 through 09/02/20. Approval number is [id number].  Called pt and left detailed voicemail with this information and requested a call back to schedule injection.

## 2019-09-03 NOTE — Telephone Encounter (Signed)
Contacted pt and she only questioned whether she was able to get the injection when she came to see Dr. Loanne Drilling. Pt was advised that she can.

## 2019-09-03 NOTE — Telephone Encounter (Signed)
Please advise 

## 2019-09-03 NOTE — Telephone Encounter (Signed)
Patient is calling in asking to speak with someone about how long she will need to be on Prolia and if it would be okay to start the first injection on 09/26/2019

## 2019-09-11 DIAGNOSIS — E109 Type 1 diabetes mellitus without complications: Secondary | ICD-10-CM | POA: Diagnosis not present

## 2019-09-26 ENCOUNTER — Other Ambulatory Visit: Payer: Self-pay

## 2019-09-26 ENCOUNTER — Encounter: Payer: Self-pay | Admitting: Endocrinology

## 2019-09-26 ENCOUNTER — Ambulatory Visit: Payer: Medicare HMO | Admitting: Endocrinology

## 2019-09-26 VITALS — BP 128/72 | HR 71 | Ht 64.0 in | Wt 116.6 lb

## 2019-09-26 DIAGNOSIS — Z794 Long term (current) use of insulin: Secondary | ICD-10-CM | POA: Diagnosis not present

## 2019-09-26 DIAGNOSIS — E1065 Type 1 diabetes mellitus with hyperglycemia: Secondary | ICD-10-CM | POA: Diagnosis not present

## 2019-09-26 DIAGNOSIS — E1169 Type 2 diabetes mellitus with other specified complication: Secondary | ICD-10-CM | POA: Diagnosis not present

## 2019-09-26 DIAGNOSIS — E782 Mixed hyperlipidemia: Secondary | ICD-10-CM | POA: Diagnosis not present

## 2019-09-26 DIAGNOSIS — I10 Essential (primary) hypertension: Secondary | ICD-10-CM | POA: Diagnosis not present

## 2019-09-26 DIAGNOSIS — M810 Age-related osteoporosis without current pathological fracture: Secondary | ICD-10-CM | POA: Diagnosis not present

## 2019-09-26 DIAGNOSIS — Z1231 Encounter for screening mammogram for malignant neoplasm of breast: Secondary | ICD-10-CM | POA: Diagnosis not present

## 2019-09-26 LAB — BASIC METABOLIC PANEL
BUN: 22 mg/dL (ref 6–23)
CO2: 29 mEq/L (ref 19–32)
Calcium: 9.6 mg/dL (ref 8.4–10.5)
Chloride: 106 mEq/L (ref 96–112)
Creatinine, Ser: 0.79 mg/dL (ref 0.40–1.20)
GFR: 71.04 mL/min (ref >60.00)
Glucose, Bld: 186 mg/dL — ABNORMAL HIGH (ref 70–99)
Potassium: 4.3 mEq/L (ref 3.5–5.1)
Sodium: 140 mEq/L (ref 135–145)

## 2019-09-26 LAB — POCT GLYCOSYLATED HEMOGLOBIN (HGB A1C): Hemoglobin A1C: 7.3 % — AB (ref 4.0–5.6)

## 2019-09-26 LAB — VITAMIN D 25 HYDROXY (VIT D DEFICIENCY, FRACTURES): VITD: 32.16 ng/mL (ref 30.00–100.00)

## 2019-09-26 MED ORDER — DENOSUMAB 60 MG/ML ~~LOC~~ SOSY
60.0000 mg | PREFILLED_SYRINGE | Freq: Once | SUBCUTANEOUS | Status: AC
  Administered 2019-09-26: 60 mg via SUBCUTANEOUS

## 2019-09-26 NOTE — Patient Instructions (Addendum)
Please ask Dr Brigitte Pulse that the current bone density result be sent here.   Please continue these settings: basal rate of 0.3 units/hr.   mealtime bolus to 1 unit/ 40 grams carbohydrate, except please add 1 unit to your calculated breakfast bolus.   correction bolus (which some people call "sensitivity," or "insulin sensitivity ratio," or just "isr") of 1 unit for each 100 by which your glucose exceeds 150.   Please suspend the pump for approx 1 hour, for exercise.  check your blood sugar 4 times a day: before the 3 meals, and at bedtime.  also check if you have symptoms of your blood sugar being too high or too low.  please keep a record of the readings and bring it to your next appointment here (or you can bring the meter itself).  You can write it on any piece of paper.  please call us sooner if your blood sugar goes below 70, or if you have a lot of readings over 200.   Please come back for a follow-up appointment in 3-4 months.

## 2019-09-26 NOTE — Progress Notes (Signed)
Subjective:    Patient ID: Paticia [name], female    DOB: 1946/02/26, 74 y.o.   MRN: [id number]  HPI Pt returns for f/u of diabetes mellitus:  DM type: type 1 is dx'ed, based on lean body habitus and insulin sensitivity.   Dx'ed: 4431 Complications: none. Therapy: insulin since 06-08-2009, and pump rx since Jun 09, 2015 GDM: never.   DKA: never. Severe hypoglycemia: never.  Pancreatitis: never.  Other: she has been on pump rx (now Omnipod) since early 06/09/15; she declines continuous glucose monitor.   Interval history: She takes these settings:  basal rate of units/hr, except for units/hr, 3 am to 6 am bolus of 1 unit/grams carbohydrate correction bolus (which some people call "sensitivity," or "insulin sensitivity ratio," or just "isr") of 1 unit for each by which your glucose exceeds 100. Meter is downloaded today, and the printout is scanned into the record.  There is only 1 cbg (130).     Pt returns for f/u of osteoporosis:  Dx'ed: 09-Jun-2007 Secondary causes: none Fractures: right wrist Jun 08, 2008) Past rx: fosamax approx 2008-2013 Current rx: prolia since 2017/06/08 Last DEXA result: 06/08/17 was requested Other: none.   Interval hx: she tolerates prolia well.    Past Medical History:  Diagnosis Date  . Diabetes mellitus ([place])   . Hyperlipidemia   . Osteopenia   . Pericardial effusion   . PONV (postoperative nausea and vomiting)   . Seasonal allergies     Past Surgical History:  Procedure Laterality Date  . rectal polyps removed    . right wrist fx    . SUBXYPHOID PERICARDIAL WINDOW  02/18/2012   Procedure: SUBXYPHOID PERICARDIAL WINDOW;  Surgeon: Ivin Poot, MD;  Location: [facility] OR;  Service: Thoracic;  Laterality: N/A;  TEE    Social History   Socioeconomic History  . Marital status: Widowed    Spouse name: died 06-09-11  . Number of children: 1  . Years of education: Not on file  . Highest education level: Not on file  Occupational History  . Occupation: Optician, dispensing top  Tobacco Use   . Smoking status: Never Smoker  . Smokeless tobacco: Never Used  Substance and Sexual Activity  . Alcohol use: No  . Drug use: No  . Sexual activity: Not on file  Other Topics Concern  . Not on file  Social History Narrative  . Not on file   Social Determinants of Health   Financial Resource Strain:   . Difficulty of Paying Living Expenses:   Food Insecurity:   . Worried About Charity fundraiser in the Last Year:   . Arboriculturist in the Last Year:   Transportation Needs:   . Film/video editor (Medical):   Marland Kitchen Lack of Transportation (Non-Medical):   Physical Activity:   . Days of Exercise per Week:   . Minutes of Exercise per Session:   Stress:   . Feeling of Stress :   Social Connections:   . Frequency of Communication with Friends and Family:   . Frequency of Social Gatherings with Friends and Family:   . Attends Religious Services:   . Active Member of Clubs or Organizations:   . Attends Archivist Meetings:   Marland Kitchen Marital Status:   Intimate Partner Violence:   . Fear of Current or Ex-Partner:   . Emotionally Abused:   Marland Kitchen Physically Abused:   . Sexually Abused:     Current Outpatient Medications on File Prior to Visit  Medication Sig Dispense Refill  . ALPRAZolam (XANAX) 0.5 MG tablet Take 0.5 mg by mouth every 6 (six) hours as needed for anxiety.    Marland Kitchen aspirin 81 MG tablet Take 81 mg by mouth every other day.     . Calcium Carb-Cholecalciferol (CALTRATE 600+D) 600-800 MG-UNIT TABS Take by mouth.    Marland Kitchen CINNAMON PO Take 1,000 mg by mouth.    . diazepam (VALIUM) 5 MG tablet Take 5 mg by mouth every 6 (six) hours as needed for anxiety.    . diphenhydrAMINE (BENADRYL) 25 mg capsule Take 25 mg by mouth every 6 (six) hours as needed.    Marland Kitchen escitalopram (LEXAPRO) 10 MG tablet Take 10 mg by mouth daily.    Marland Kitchen glucose blood (ONETOUCH ULTRA) test strip 1 each by Other route in the morning, at noon, in the evening, and at bedtime. E11.9 400 each 0  . insulin aspart  (NOVOLOG) 100 UNIT/ML injection USE WITH INSULIN PUMP 40   UNITS DAILY 40 mL 2  . Insulin Infusion Pump Supplies (INSET INFUSION SET 23" 6MM) MISC 1 Device by Does not apply route every 3 (three) days.    . Insulin Infusion Pump Supplies (INSULIN PUMP SYRINGE RESERVOIR) MISC 1 Device by Does not apply route every 3 (three) days. Animas 2 ml    . Lancets (ONETOUCH ULTRASOFT) lancets 1 each by Other route 4 (four) times daily. E11.9 400 each 0  . multivitamin-lutein (OCUVITE-LUTEIN) CAPS capsule Take 1 capsule by mouth daily.    . pravastatin (PRAVACHOL) 40 MG tablet Take 40 mg by mouth daily.    . sertraline (ZOLOFT) 100 MG tablet Take 50 mg by mouth daily.     . TURMERIC PO Take by mouth.    . vitamin C (ASCORBIC ACID) 500 MG tablet Take 500 mg by mouth daily.     No current facility-administered medications on file prior to visit.    Allergies  Allergen Reactions  . Morphine And Related   . Januvia [Sitagliptin]     Family History  Problem Relation Age of Onset  . Heart attack Father   . Diabetes Brother   . Heart disease Brother   . Cancer Sister        ovarian    BP 128/72 (BP Location: Left Arm, Patient Position: Sitting, Cuff Size: Normal)   Pulse 71   Ht 5\' 4"  (1.626 m)   Wt 116 lb 9.6 oz (52.9 kg)   SpO2 98%   BMI 20.01 kg/m    Review of Systems She denies hypoglycemia    Objective:   Physical Exam VITAL SIGNS:  See vs page GENERAL: no distress Pulses: dorsalis pedis intact bilat.   MSK: no deformity of the feet CV: no leg edema Skin:  no ulcer on the feet.  normal color and temp on the feet. Neuro: sensation is intact to touch on the feet.    Lab Results  Component Value Date   HGBA1C 7.3 (A) 09/26/2019       Assessment & Plan:  Type 1 DM: we discussed continuous glucose monitor again.  She declines.   Noncompliance with cbg recording: this limits aggressiveness of glycemic control Osteoporosis, due for recheck  Patient Instructions  Please ask Dr  Brigitte Pulse that the current bone density result be sent here.   Please continue these settings: basal rate of 0.3 units/hr.   mealtime bolus to 1 unit/ 40 grams carbohydrate, except please add 1 unit to your calculated breakfast bolus.  correction bolus (which some people call "sensitivity," or "insulin sensitivity ratio," or just "isr") of 1 unit for each 100 by which your glucose exceeds 150.   Please suspend the pump for approx 1 hour, for exercise.  check your blood sugar 4 times a day: before the 3 meals, and at bedtime.  also check if you have symptoms of your blood sugar being too high or too low.  please keep a record of the readings and bring it to your next appointment here (or you can bring the meter itself).  You can write it on any piece of paper.  please call us sooner if your blood sugar goes below 70, or if you have a lot of readings over 200.   Please come back for a follow-up appointment in 3-4 months.

## 2019-09-26 NOTE — Progress Notes (Signed)
Per orders of Dr.Ellison injection of Prolia 60mg   given today by N.Genavie Boettger,LPN. Patient tolerated injection well.

## 2019-09-27 LAB — PTH, INTACT AND CALCIUM
Calcium: 9.6 mg/dL (ref 8.6–10.4)
PTH: 22 pg/mL (ref 14–64)

## 2019-10-03 DIAGNOSIS — R69 Illness, unspecified: Secondary | ICD-10-CM | POA: Diagnosis not present

## 2019-10-22 DIAGNOSIS — E1165 Type 2 diabetes mellitus with hyperglycemia: Secondary | ICD-10-CM | POA: Diagnosis not present

## 2019-11-13 DIAGNOSIS — M79675 Pain in left toe(s): Secondary | ICD-10-CM | POA: Diagnosis not present

## 2019-11-13 DIAGNOSIS — E1142 Type 2 diabetes mellitus with diabetic polyneuropathy: Secondary | ICD-10-CM | POA: Diagnosis not present

## 2019-11-13 DIAGNOSIS — M79674 Pain in right toe(s): Secondary | ICD-10-CM | POA: Diagnosis not present

## 2019-11-13 DIAGNOSIS — B351 Tinea unguium: Secondary | ICD-10-CM | POA: Diagnosis not present

## 2019-11-13 DIAGNOSIS — L84 Corns and callosities: Secondary | ICD-10-CM | POA: Diagnosis not present

## 2019-12-07 DIAGNOSIS — E109 Type 1 diabetes mellitus without complications: Secondary | ICD-10-CM | POA: Diagnosis not present

## 2019-12-26 DIAGNOSIS — R69 Illness, unspecified: Secondary | ICD-10-CM | POA: Diagnosis not present

## 2020-01-15 DIAGNOSIS — E1165 Type 2 diabetes mellitus with hyperglycemia: Secondary | ICD-10-CM | POA: Diagnosis not present

## 2020-01-22 DIAGNOSIS — M79676 Pain in unspecified toe(s): Secondary | ICD-10-CM | POA: Diagnosis not present

## 2020-01-22 DIAGNOSIS — E1142 Type 2 diabetes mellitus with diabetic polyneuropathy: Secondary | ICD-10-CM | POA: Diagnosis not present

## 2020-01-22 DIAGNOSIS — B351 Tinea unguium: Secondary | ICD-10-CM | POA: Diagnosis not present

## 2020-01-22 DIAGNOSIS — L84 Corns and callosities: Secondary | ICD-10-CM | POA: Diagnosis not present

## 2020-01-23 ENCOUNTER — Other Ambulatory Visit: Payer: Self-pay | Admitting: Endocrinology

## 2020-01-23 MED ORDER — INSULIN ASPART 100 UNIT/ML ~~LOC~~ SOLN: SUBCUTANEOUS | 2 refills | Status: DC

## 2020-01-23 NOTE — Telephone Encounter (Signed)
Patient called to request a refill for the Novolog to be sent to [facility]

## 2020-01-23 NOTE — Telephone Encounter (Signed)
Left message on machine that rx has been sent in. 

## 2020-01-23 NOTE — Telephone Encounter (Signed)
Please see below.

## 2020-01-23 NOTE — Telephone Encounter (Signed)
Please refill prn 

## 2020-02-06 ENCOUNTER — Encounter: Payer: Self-pay | Admitting: Endocrinology

## 2020-02-06 ENCOUNTER — Other Ambulatory Visit: Payer: Self-pay

## 2020-02-06 ENCOUNTER — Ambulatory Visit: Payer: Medicare HMO | Admitting: Endocrinology

## 2020-02-06 VITALS — BP 118/82 | HR 76 | Ht 64.0 in | Wt 113.0 lb

## 2020-02-06 DIAGNOSIS — E1065 Type 1 diabetes mellitus with hyperglycemia: Secondary | ICD-10-CM

## 2020-02-06 NOTE — Patient Instructions (Addendum)
Please continue these settings: basal rate of 0.35 units/hr.   mealtime bolus to 1 unit/ 40 grams carbohydrate, except please add 1 unit to your calculated breakfast bolus.   correction bolus (which some people call "sensitivity," or "insulin sensitivity ratio," or just "isr") of 1 unit for each 100 by which your glucose exceeds 150.   Please suspend the pump for approx 1 hour, for exercise.  check your blood sugar 4 times a day: before the 3 meals, and at bedtime.  also check if you have symptoms of your blood sugar being too high or too low.  please keep a record of the readings and bring it to your next appointment here (or you can bring the meter itself).  You can write it on any piece of paper.  please call us sooner if your blood sugar goes below 70, or if you have a lot of readings over 200.   If you run out of insulin, you can buy a vial of regular insulin without a prescription, and use it unit-for-unit instead of Novolog.  Please come back for a follow-up appointment in 2 months.

## 2020-02-06 NOTE — Progress Notes (Addendum)
Subjective:    Patient ID: Audrey [name], female    DOB: [birth year], 74 y.o.   MRN: [id number]  HPI Pt returns for f/u of diabetes mellitus:  DM type: type 1 is dx'ed, based on lean body habitus and insulin sensitivity.   Dx'ed: 2633 Complications: none. Therapy: insulin since 11-Jun-2009, and pump rx since 06-12-15 GDM: never.   DKA: never. Severe hypoglycemia: never.  Pancreatitis: never.  Other: she has been on pump rx (now Omnipod) since early June 12, 2015; she declines continuous glucose monitor.   Interval history: She takes these settings:  basal rate of 0.3 units/hr.   mealtime bolus to 1 unit/ 40 grams carbohydrate, except please add 1 unit to your calculated breakfast bolus.    correction bolus (which some people call "sensitivity," or "insulin sensitivity ratio," or just "isr") of 1 unit for each 100 by which your glucose exceeds 150.  TDD is 24 units (28% basal) Meter is downloaded today, and the printout is scanned into the record.  cbg varies fom 140 up to 500.  There is no trend throughout the day. She seldom has hypoglycemia, and these episodes are mild.  She was out of town a few weeks ago, and she was out of insulin x 1 1/2 days.  She says cbg was 700 when she returned home.  Past Medical History:  Diagnosis Date  . Diabetes mellitus ([place])   . Hyperlipidemia   . Osteopenia   . Pericardial effusion   . PONV (postoperative nausea and vomiting)   . Seasonal allergies     Past Surgical History:  Procedure Laterality Date  . rectal polyps removed    . right wrist fx    . SUBXYPHOID PERICARDIAL WINDOW  02/18/2012   Procedure: SUBXYPHOID PERICARDIAL WINDOW;  Surgeon: Ivin Poot, MD;  Location: [facility] OR;  Service: Thoracic;  Laterality: N/A;  TEE    Social History   Socioeconomic History  . Marital status: Widowed    Spouse name: died 06-12-2011  . Number of children: 1  . Years of education: Not on file  . Highest education level: Not on file  Occupational History  .  Occupation: Optician, dispensing top  Tobacco Use  . Smoking status: Never Smoker  . Smokeless tobacco: Never Used  Substance and Sexual Activity  . Alcohol use: No  . Drug use: No  . Sexual activity: Not on file  Other Topics Concern  . Not on file  Social History Narrative  . Not on file   Social Determinants of Health   Financial Resource Strain:   . Difficulty of Paying Living Expenses: Not on file  Food Insecurity:   . Worried About Charity fundraiser in the Last Year: Not on file  . Ran Out of Food in the Last Year: Not on file  Transportation Needs:   . Lack of Transportation (Medical): Not on file  . Lack of Transportation (Non-Medical): Not on file  Physical Activity:   . Days of Exercise per Week: Not on file  . Minutes of Exercise per Session: Not on file  Stress:   . Feeling of Stress : Not on file  Social Connections:   . Frequency of Communication with Friends and Family: Not on file  . Frequency of Social Gatherings with Friends and Family: Not on file  . Attends Religious Services: Not on file  . Active Member of Clubs or Organizations: Not on file  . Attends Archivist Meetings: Not on file  .  Marital Status: Not on file  Intimate Partner Violence:   . Fear of Current or Ex-Partner: Not on file  . Emotionally Abused: Not on file  . Physically Abused: Not on file  . Sexually Abused: Not on file    Current Outpatient Medications on File Prior to Visit  Medication Sig Dispense Refill  . ALPRAZolam (XANAX) 0.5 MG tablet Take 0.5 mg by mouth every 6 (six) hours as needed for anxiety.    Marland Kitchen aspirin 81 MG tablet Take 81 mg by mouth every other day.     . Calcium Carb-Cholecalciferol (CALTRATE 600+D) 600-800 MG-UNIT TABS Take by mouth.    Marland Kitchen CINNAMON PO Take 1,000 mg by mouth.    . diazepam (VALIUM) 5 MG tablet Take 5 mg by mouth every 6 (six) hours as needed for anxiety.    . diphenhydrAMINE (BENADRYL) 25 mg capsule Take 25 mg by mouth every 6 (six) hours  as needed.    Marland Kitchen escitalopram (LEXAPRO) 10 MG tablet Take 10 mg by mouth daily.    Marland Kitchen glucose blood (ONETOUCH ULTRA) test strip 1 each by Other route in the morning, at noon, in the evening, and at bedtime. E11.9 400 each 0  . insulin aspart (NOVOLOG) 100 UNIT/ML injection USE WITH INSULIN PUMP 40   UNITS DAILY 40 mL 2  . Insulin Infusion Pump Supplies (INSET INFUSION SET 23" 6MM) MISC 1 Device by Does not apply route every 3 (three) days.    . Insulin Infusion Pump Supplies (INSULIN PUMP SYRINGE RESERVOIR) MISC 1 Device by Does not apply route every 3 (three) days. Animas 2 ml    . Lancets (ONETOUCH ULTRASOFT) lancets 1 each by Other route 4 (four) times daily. E11.9 400 each 0  . multivitamin-lutein (OCUVITE-LUTEIN) CAPS capsule Take 1 capsule by mouth daily.    . pravastatin (PRAVACHOL) 40 MG tablet Take 40 mg by mouth daily.    . sertraline (ZOLOFT) 100 MG tablet Take 50 mg by mouth daily.     . TURMERIC PO Take by mouth.    . vitamin C (ASCORBIC ACID) 500 MG tablet Take 500 mg by mouth daily.     No current facility-administered medications on file prior to visit.    Allergies  Allergen Reactions  . Morphine And Related   . Januvia [Sitagliptin]     Family History  Problem Relation Age of Onset  . Heart attack Father   . Diabetes Brother   . Heart disease Brother   . Cancer Sister        ovarian    BP 118/82   Pulse 76   Ht 5\' 4"  (1.626 m)   Wt 113 lb (51.3 kg)   SpO2 98%   BMI 19.40 kg/m    Review of Systems Denies LOC.     Objective:   Physical Exam VITAL SIGNS:  See vs page GENERAL: no distress Pulses: dorsalis pedis intact bilat.   MSK: no deformity of the feet CV: no leg edema Skin:  no ulcer on the feet.  normal color and temp on the feet. Neuro: sensation is intact to touch on the feet Ext: left great toenail is ingrown, but no erythema/swell/drainage   A1c=8.6%    Assessment & Plan:  Type 1 DM: uncontrolled SDOH: she missed 1-2 days of insulin, so  we won't increase now.  Patient Instructions  Please continue these settings: basal rate of 0.35 units/hr.   mealtime bolus to 1 unit/ 40 grams carbohydrate, except please add 1  unit to your calculated breakfast bolus.   correction bolus (which some people call "sensitivity," or "insulin sensitivity ratio," or just "isr") of 1 unit for each 100 by which your glucose exceeds 150.   Please suspend the pump for approx 1 hour, for exercise.  check your blood sugar 4 times a day: before the 3 meals, and at bedtime.  also check if you have symptoms of your blood sugar being too high or too low.  please keep a record of the readings and bring it to your next appointment here (or you can bring the meter itself).  You can write it on any piece of paper.  please call us sooner if your blood sugar goes below 70, or if you have a lot of readings over 200.   If you run out of insulin, you can buy a vial of regular insulin without a prescription, and use it unit-for-unit instead of Novolog.  Please come back for a follow-up appointment in 2 months.

## 2020-02-07 DIAGNOSIS — R69 Illness, unspecified: Secondary | ICD-10-CM | POA: Diagnosis not present

## 2020-02-19 ENCOUNTER — Telehealth: Payer: Self-pay | Admitting: Nutrition

## 2020-02-19 DIAGNOSIS — E109 Type 1 diabetes mellitus without complications: Secondary | ICD-10-CM | POA: Diagnosis not present

## 2020-02-19 NOTE — Telephone Encounter (Signed)
Patient reports that blood sugars are still running high.  FBS today was 259.  Yesterday 192, and day before 277.  Also, acL: 296, acS: 203.  Yesterday it was 325.  Says has a cold, and some "stress".   Wants to know if she can increase her basal rate from 3.5 to 4.0

## 2020-02-19 NOTE — Telephone Encounter (Signed)
Yes, those settings are correct.

## 2020-02-19 NOTE — Telephone Encounter (Signed)
I have these settings: basal rate of 0.35 units/hr.   mealtime bolus to 1 unit/ 40 grams carbohydrate, except please add 1 unit to your calculated breakfast bolus.   correction bolus (which some people call "sensitivity," or "insulin sensitivity ratio," or just "isr") of 1 unit for each 100 by which your glucose exceeds 150.   Please verify, thanks.

## 2020-02-19 NOTE — Telephone Encounter (Signed)
Ok, please increase to basal rate of 0.45 units/hr.  Thank you

## 2020-02-20 NOTE — Telephone Encounter (Signed)
Message left that we need to increase basal rate to 0.45, and to call me back to help her make these changes.  Telephone number given

## 2020-02-25 NOTE — Telephone Encounter (Signed)
Another message left to call me to make sure she has changed her pump settings from previous message.

## 2020-02-27 ENCOUNTER — Other Ambulatory Visit: Payer: Self-pay

## 2020-02-27 ENCOUNTER — Encounter: Payer: Medicare HMO | Attending: Endocrinology | Admitting: Nutrition

## 2020-02-27 DIAGNOSIS — E1065 Type 1 diabetes mellitus with hyperglycemia: Secondary | ICD-10-CM | POA: Insufficient documentation

## 2020-02-27 NOTE — Patient Instructions (Signed)
REad over directions for [place]. Scan [place] every 8 hours. Call [place] help line if questions.

## 2020-02-27 NOTE — Progress Notes (Signed)
Patient is here to have her new OmniPod PDM set up.  Her old screen is hard to read.  Settings were transferred from her old pump, except for new basal rate per Dr. Cordelia Pen note on 12/15.  Basal rate: 0.45, I/C:: 40, ISF: 100, target 120 with correction over 150.  Pt. Did not want to start this today.  Said her old pod will expire tomorrow and she will use the new PDM tomorrow to set up a new pod. She also is requesting a Libre sensor.  Sample Elenor Legato given to her with a reader--she prefers the reader to her phone.  She was show how to insert the sensor and how to scan for reading.  She reported good understanding of this with no questions.  Alarms set for low: less than 70, and Highs: over 240.  She was told to scan at least every 8 hours to capture all of the readings.  She had no final questions

## 2020-03-04 ENCOUNTER — Telehealth: Payer: Self-pay | Admitting: Nutrition

## 2020-03-04 NOTE — Telephone Encounter (Signed)
Patient called saying that she likes the [facility] 2 and would like to continue using this.  Can we please get an order sent to [facility]  One q 14 days. Thank you

## 2020-03-12 ENCOUNTER — Telehealth: Payer: Self-pay | Admitting: Nutrition

## 2020-03-12 NOTE — Telephone Encounter (Signed)
Patient reports that she really likes the [facility] 2 and would like to have this.  Please call/fax in a prescription to [facility] [phone] / [phone](fax) for [facility] 2 sensors 1 Q 14 days.  ( She has the reader) Thank you

## 2020-03-31 ENCOUNTER — Telehealth: Payer: Self-pay | Admitting: Endocrinology

## 2020-03-31 ENCOUNTER — Telehealth: Payer: Self-pay | Admitting: Nutrition

## 2020-03-31 DIAGNOSIS — E1165 Type 2 diabetes mellitus with hyperglycemia: Secondary | ICD-10-CM | POA: Diagnosis not present

## 2020-03-31 NOTE — Telephone Encounter (Signed)
Patient called asking when she can schedule her prolia? Ph# [phone]

## 2020-03-31 NOTE — Telephone Encounter (Signed)
Patient reports that she has called about her Audrey [name] sensor, and they said they are waiting on paperwork from Dr. Cordelia Pen office. Also, she is past due for her Prolea injection.  It was due on 03/22/20, and no one has called her.  Please advise

## 2020-04-01 ENCOUNTER — Telehealth: Payer: Self-pay | Admitting: Endocrinology

## 2020-04-01 NOTE — Telephone Encounter (Signed)
Pt called again asking when she can get her prolia injection. She also asked if we received a fax regarding the paperwork for her [employer]. Pt said she would call the company again and ask them to refax.

## 2020-04-01 NOTE — Telephone Encounter (Signed)
[facility] called to request we review and resend the paperwork for pt's prescription. They said that in the last paperwork the diagnosis code didn't match the chart notes and so they need the correct code and for them to match stating if she has either type 1 or type 2 diabetes. They are re-faxing the paperwork now.

## 2020-04-02 ENCOUNTER — Encounter: Payer: Self-pay | Admitting: Endocrinology

## 2020-04-02 DIAGNOSIS — E109 Type 1 diabetes mellitus without complications: Secondary | ICD-10-CM | POA: Insufficient documentation

## 2020-04-02 NOTE — Telephone Encounter (Signed)
Notified pt received and complete the form to [facility] refaxed--  Also, notified pt with lisa message.

## 2020-04-02 NOTE — Telephone Encounter (Signed)
Reverified due to it being a new year I will contact the pt for scheduling once Summary of Benefits comes back

## 2020-04-04 NOTE — Telephone Encounter (Signed)
Re-faxed form to [facility]

## 2020-04-07 NOTE — Telephone Encounter (Signed)
Pt called again asking when she can get her prolia injection - advised of note from 04/02/20 and that we were waiting on Summary of Benefits (advised that portal had been having technical difficulties) but patient wanted a note sent

## 2020-04-08 NOTE — Telephone Encounter (Signed)
I called [facility]  and they reported that they still did not get paperwork, dispite fax confirmation sheet.  It was again refaxed and message left with them that it was refaxed with correct diagnosis code.

## 2020-04-10 DIAGNOSIS — E1165 Type 2 diabetes mellitus with hyperglycemia: Secondary | ICD-10-CM | POA: Diagnosis not present

## 2020-04-16 ENCOUNTER — Other Ambulatory Visit: Payer: Self-pay

## 2020-04-16 ENCOUNTER — Encounter: Payer: Medicare HMO | Attending: Endocrinology | Admitting: Nutrition

## 2020-04-16 DIAGNOSIS — E782 Mixed hyperlipidemia: Secondary | ICD-10-CM | POA: Diagnosis not present

## 2020-04-16 DIAGNOSIS — M47816 Spondylosis without myelopathy or radiculopathy, lumbar region: Secondary | ICD-10-CM | POA: Diagnosis not present

## 2020-04-16 DIAGNOSIS — I10 Essential (primary) hypertension: Secondary | ICD-10-CM | POA: Diagnosis not present

## 2020-04-16 DIAGNOSIS — Z0000 Encounter for general adult medical examination without abnormal findings: Secondary | ICD-10-CM | POA: Diagnosis not present

## 2020-04-16 DIAGNOSIS — R636 Underweight: Secondary | ICD-10-CM | POA: Diagnosis not present

## 2020-04-16 DIAGNOSIS — R69 Illness, unspecified: Secondary | ICD-10-CM | POA: Diagnosis not present

## 2020-04-16 DIAGNOSIS — G4700 Insomnia, unspecified: Secondary | ICD-10-CM | POA: Diagnosis not present

## 2020-04-16 DIAGNOSIS — M810 Age-related osteoporosis without current pathological fracture: Secondary | ICD-10-CM | POA: Diagnosis not present

## 2020-04-16 DIAGNOSIS — E109 Type 1 diabetes mellitus without complications: Secondary | ICD-10-CM | POA: Insufficient documentation

## 2020-04-16 DIAGNOSIS — L409 Psoriasis, unspecified: Secondary | ICD-10-CM | POA: Diagnosis not present

## 2020-04-16 DIAGNOSIS — E1169 Type 2 diabetes mellitus with other specified complication: Secondary | ICD-10-CM | POA: Diagnosis not present

## 2020-04-16 LAB — BASIC METABOLIC PANEL: Glucose: 279

## 2020-04-16 NOTE — Patient Instructions (Signed)
Read over directions for use. Attach a new sensor every 14 days.   Call [place] help line if questions.

## 2020-04-16 NOTE — Progress Notes (Signed)
Patient was trained on how to use the freestyle LIbre 2.  She prefers the readings to go to the reader.  The reader was set up with current date/time and alerts: low: less than 70, and high: over 250.  She was shown how to attach the sensor and start the sensor.  We reviewed this and she was given a booklet with the steps to attach and scan the sensor. She had no final questions.

## 2020-04-21 ENCOUNTER — Encounter: Payer: Self-pay | Admitting: *Deleted

## 2020-04-21 ENCOUNTER — Telehealth: Payer: Self-pay | Admitting: *Deleted

## 2020-04-21 NOTE — Telephone Encounter (Signed)
Notified pt received the Summary of  Benefit Form--prolia injection. Pt questionig when can prolia injection will be available. Notified pt-- form will be on the table for [facility] for review.

## 2020-04-29 ENCOUNTER — Telehealth: Payer: Self-pay

## 2020-04-29 NOTE — Telephone Encounter (Signed)
COVERAGE AVAILABLE: Yes COVERAGE DETAILS: The benefits provided on this Verification of Benefits form are Medical Benefits and are the patient's In-Network benefits for Prolia. If you would like Pharmacy Benefits for Prolia, please call [phone].  AUTHORIZATION REQUIRED: Yes PA PROCESS DETAILS: PA is required. Call [phone] or complete the PA form available at [email or web address]  20% co-insurance for Prolia @0 % admin fee  PA pending.

## 2020-05-10 DIAGNOSIS — E1165 Type 2 diabetes mellitus with hyperglycemia: Secondary | ICD-10-CM | POA: Diagnosis not present

## 2020-05-13 NOTE — Telephone Encounter (Signed)
Prior Auth initiated via CoverMyMeds Key: [employer]

## 2020-05-14 NOTE — Telephone Encounter (Signed)
Pt has been approved for Prolia Soln Pref Syr 60mg /ml from 05/14/2020-05/14/2021

## 2020-05-15 ENCOUNTER — Telehealth: Payer: Self-pay | Admitting: Endocrinology

## 2020-05-15 ENCOUNTER — Ambulatory Visit: Payer: Medicare HMO | Admitting: Endocrinology

## 2020-05-15 ENCOUNTER — Other Ambulatory Visit: Payer: Self-pay

## 2020-05-15 VITALS — BP 140/70 | HR 77 | Ht 64.0 in | Wt 116.6 lb

## 2020-05-15 DIAGNOSIS — M810 Age-related osteoporosis without current pathological fracture: Secondary | ICD-10-CM | POA: Diagnosis not present

## 2020-05-15 DIAGNOSIS — E1065 Type 1 diabetes mellitus with hyperglycemia: Secondary | ICD-10-CM

## 2020-05-15 DIAGNOSIS — E109 Type 1 diabetes mellitus without complications: Secondary | ICD-10-CM | POA: Diagnosis not present

## 2020-05-15 LAB — POCT GLYCOSYLATED HEMOGLOBIN (HGB A1C): Hemoglobin A1C: 7 % — AB (ref 4.0–5.6)

## 2020-05-15 NOTE — Telephone Encounter (Signed)
ok 

## 2020-05-15 NOTE — Progress Notes (Signed)
Subjective:    Patient ID: Audrey [name], female    DOB: [birth year], 75 y.o.   MRN: [id number]  HPI  Pt returns for f/u of osteoporosis:  Dx'ed: June 12, 2007 Secondary causes: none Fractures: right wrist 06-11-08) Past rx: fosamax approx 2008-2013 Current rx: prolia since 2017/06/11 Last DEXA result: June 11, 2017 was requested Other: none.   Interval hx: she tolerates prolia well.   Pt also has diabetes mellitus:  DM type: type 1 is dx'ed, based on lean body habitus and insulin sensitivity.   Dx'ed: 6213 Complications: none. Therapy: insulin since 06/11/2009, and pump rx (Omnipod) since 06/12/2015 GDM: never.   DKA: never. Severe hypoglycemia: never.  Pancreatitis: never.  Other: she has been on pump rx (now Omnipod) since early 2015-06-12; she declines continuous glucose monitor.   Interval history: She takes these settings:  basal rate of 0.45 units/hr.   mealtime bolus to 1 unit/ 40 grams carbohydrate, except please add 1 unit to your calculated breakfast bolus.   correction bolus (which some people call "sensitivity," or "insulin sensitivity ratio," or just "isr") of 1 unit for each 100 by which your glucose exceeds 150.   She suspends the pump for approx 1 hour, for exercise.   TDD is 22 units (49% basal).  She takes 4.8 boluses/d continuous glucose monitor is downloaded today, and the printout is scanned into the record.  glucose varies fom 60 up to 310.  It increases 7AM-12PM.  It then decreases 3PM-12MN. She has mild hypoglycemia approx BIW.  This usually happens PC rather than AC--any meal.   Past Medical History:  Diagnosis Date  . Diabetes mellitus ([place])   . Hyperlipidemia   . Osteopenia    06-11-00 T-Score -2  . Pericardial effusion   . PONV (postoperative nausea and vomiting)   . Seasonal allergies     Past Surgical History:  Procedure Laterality Date  . rectal polyps removed    . right wrist fx    . SUBXYPHOID PERICARDIAL WINDOW  02/18/2012   Procedure: SUBXYPHOID PERICARDIAL WINDOW;  Surgeon:  Ivin Poot, MD;  Location: [facility];  Service: Thoracic;  Laterality: N/A;  TEE    Social History   Socioeconomic History  . Marital status: Widowed    Spouse name: died June 12, 2011  . Number of children: 1  . Years of education: Not on file  . Highest education level: Not on file  Occupational History  . Occupation: Optician, dispensing top  Tobacco Use  . Smoking status: Never Smoker  . Smokeless tobacco: Never Used  Substance and Sexual Activity  . Alcohol use: No  . Drug use: No  . Sexual activity: Not on file  Other Topics Concern  . Not on file  Social History Narrative  . Not on file   Social Determinants of Health   Financial Resource Strain: Not on file  Food Insecurity: Not on file  Transportation Needs: Not on file  Physical Activity: Not on file  Stress: Not on file  Social Connections: Not on file  Intimate Partner Violence: Not on file    Current Outpatient Medications on File Prior to Visit  Medication Sig Dispense Refill  . ALPRAZolam (XANAX) 0.5 MG tablet Take 0.5 mg by mouth every 6 (six) hours as needed for anxiety.    Marland Kitchen aspirin 81 MG tablet Take 81 mg by mouth every other day.     . Calcium Carb-Cholecalciferol 600-800 MG-UNIT TABS Take by mouth.    Marland Kitchen CINNAMON PO Take 1,000 mg  by mouth.    . diazepam (VALIUM) 5 MG tablet Take 5 mg by mouth every 6 (six) hours as needed for anxiety.    . diphenhydrAMINE (BENADRYL) 25 mg capsule Take 25 mg by mouth every 6 (six) hours as needed.    Marland Kitchen escitalopram (LEXAPRO) 10 MG tablet Take 10 mg by mouth daily.    Marland Kitchen glucose blood (ONETOUCH ULTRA) test strip 1 each by Other route in the morning, at noon, in the evening, and at bedtime. E11.9 400 each 0  . insulin aspart (NOVOLOG) 100 UNIT/ML injection USE WITH INSULIN PUMP 40   UNITS DAILY 40 mL 2  . Insulin Infusion Pump Supplies (INSET INFUSION SET 23" 6MM) MISC 1 Device by Does not apply route every 3 (three) days.    . Insulin Infusion Pump Supplies (INSULIN PUMP SYRINGE  RESERVOIR) MISC 1 Device by Does not apply route every 3 (three) days. Animas 2 ml    . Lancets (ONETOUCH ULTRASOFT) lancets 1 each by Other route 4 (four) times daily. E11.9 400 each 0  . multivitamin-lutein (OCUVITE-LUTEIN) CAPS capsule Take 1 capsule by mouth daily.    . pravastatin (PRAVACHOL) 40 MG tablet Take 40 mg by mouth daily.    . sertraline (ZOLOFT) 100 MG tablet Take 50 mg by mouth daily.     . TURMERIC PO Take by mouth.    . vitamin C (ASCORBIC ACID) 500 MG tablet Take 500 mg by mouth daily.     No current facility-administered medications on file prior to visit.    Allergies  Allergen Reactions  . Morphine And Related   . Januvia [Sitagliptin]     Family History  Problem Relation Age of Onset  . Heart attack Father   . Diabetes Brother   . Heart disease Brother   . Cancer Sister        ovarian    BP 140/70 (BP Location: Right Arm, Patient Position: Sitting, Cuff Size: Normal)   Pulse 77   Ht 5\' 4"  (1.626 m)   Wt 116 lb 9.6 oz (52.9 kg)   SpO2 98%   BMI 20.01 kg/m    Review of Systems Denies LOC    Objective:   Physical Exam VITAL SIGNS:  See vs page GENERAL: no distress Pulses: dorsalis pedis intact bilat.   MSK: no deformity of the feet CV: no leg edema Skin:  no ulcer on the feet.  normal color and temp on the feet. Neuro: sensation is intact to touch on the feet  A1c=7.0%    Assessment & Plan:  Type 1 DM Hypoglycemia, due to insulin: she declines to decrease basal.   Osteoporosis, due for recheck.  Our office has requested [facility] to recheck, and send [place] result.    Patient Instructions  Please continue these settings: basal rate of 0.35 units/hr.   mealtime bolus to 1 unit/ 40 grams carbohydrate, except please add 1 unit to your calculated breakfast bolus.   correction bolus (which some people call "sensitivity," or "insulin sensitivity ratio," or just "isr") of 1 unit for each 100 by which your glucose exceeds 150.   Please suspend the pump  for approx 1 hour, for exercise.  check your blood sugar 4 times a day: before the 3 meals, and at bedtime.  also check if you have symptoms of your blood sugar being too high or too low.  please keep a record of the readings and bring it to your next appointment here (or you can bring the  meter itself).  You can write it on any piece of paper.  please call us sooner if your blood sugar goes below 70, or if you have a lot of readings over 200.   Take calcium 1200 mg per day, and vitamin-D, 400 units per day.   Please come back for a follow-up appointment in 6 months.

## 2020-05-15 NOTE — Telephone Encounter (Signed)
please contact [facility]: When was last DEXA?  If it was since 2018, please send [place] a copy.  TY

## 2020-05-15 NOTE — Patient Instructions (Addendum)
Please continue these settings: basal rate of 0.35 units/hr.   mealtime bolus to 1 unit/ 40 grams carbohydrate, except please add 1 unit to your calculated breakfast bolus.   correction bolus (which some people call "sensitivity," or "insulin sensitivity ratio," or just "isr") of 1 unit for each 100 by which your glucose exceeds 150.   Please suspend the pump for approx 1 hour, for exercise.  check your blood sugar 4 times a day: before the 3 meals, and at bedtime.  also check if you have symptoms of your blood sugar being too high or too low.  please keep a record of the readings and bring it to your next appointment here (or you can bring the meter itself).  You can write it on any piece of paper.  please call us sooner if your blood sugar goes below 70, or if you have a lot of readings over 200.   Take calcium 1200 mg per day, and vitamin-D, 400 units per day.   Please come back for a follow-up appointment in 6 months.

## 2020-05-16 NOTE — Telephone Encounter (Signed)
We don't need the 2018 result--we already have it. please contact Dr Raul Del office: Options: Please recheck DEXA, and send [place] result, or: We can order on the [place]. TY

## 2020-05-16 NOTE — Telephone Encounter (Signed)
Spoke with Joaquim Lai at Dr. Brigitte Pulse office and she stated that she would get the message to Dr. Brigitte Pulse and get it ordered and contact the pt.

## 2020-05-16 NOTE — Telephone Encounter (Signed)
Spoke with [facility] and they said that her last DEXA was 02/09/2017 and I told them to fax a copy over.

## 2020-05-19 ENCOUNTER — Telehealth: Payer: Self-pay | Admitting: Endocrinology

## 2020-05-19 NOTE — Telephone Encounter (Signed)
Omnipod called to check the status of the exemption form they faxed over on 3/9. They ask for it please to be completed and sent to [employer].

## 2020-05-19 NOTE — Telephone Encounter (Signed)
I have papers from [facility] and they will be placed on Ellison's desk on 05/21/2018 for completion.

## 2020-05-19 NOTE — Telephone Encounter (Signed)
Left message for patient to call and schedule nurse visit for Prolia injection. Patient will owe 280.00.

## 2020-05-23 ENCOUNTER — Telehealth: Payer: Self-pay | Admitting: Endocrinology

## 2020-05-23 NOTE — Telephone Encounter (Signed)
please contact patient: We received forms for Omnipod pumps.  Do you want to pursue this?  If so, i'll get you an appointment with [name].  Please le me know.

## 2020-05-26 ENCOUNTER — Telehealth: Payer: Self-pay | Admitting: Endocrinology

## 2020-05-26 ENCOUNTER — Telehealth: Payer: Self-pay

## 2020-05-26 NOTE — Telephone Encounter (Addendum)
please contact patient: ineed to know the reason why you need to change to the [facility]

## 2020-05-26 NOTE — Telephone Encounter (Signed)
Scheduled for 05/28/2020.

## 2020-05-26 NOTE — Telephone Encounter (Signed)
Spoke with pt and she stated that she would,like to stay on the pod not the pump.  Please Advise.  Sorry bout the other message

## 2020-05-27 NOTE — Telephone Encounter (Signed)
Spoke with pt and she stated that she is already on the ominpod.

## 2020-05-28 ENCOUNTER — Ambulatory Visit: Payer: Medicare HMO

## 2020-05-28 ENCOUNTER — Other Ambulatory Visit: Payer: Self-pay

## 2020-05-28 DIAGNOSIS — M810 Age-related osteoporosis without current pathological fracture: Secondary | ICD-10-CM | POA: Diagnosis not present

## 2020-05-28 DIAGNOSIS — M8589 Other specified disorders of bone density and structure, multiple sites: Secondary | ICD-10-CM | POA: Diagnosis not present

## 2020-05-28 DIAGNOSIS — Z780 Asymptomatic menopausal state: Secondary | ICD-10-CM | POA: Diagnosis not present

## 2020-05-28 MED ORDER — DENOSUMAB 60 MG/ML ~~LOC~~ SOSY
60.0000 mg | PREFILLED_SYRINGE | Freq: Once | SUBCUTANEOUS | Status: AC
  Administered 2020-05-28: 60 mg via SUBCUTANEOUS

## 2020-05-28 NOTE — Progress Notes (Signed)
Prolia injection administered to pt's left arm. Pt tolerated well. °

## 2020-05-29 ENCOUNTER — Other Ambulatory Visit: Payer: Self-pay | Admitting: Endocrinology

## 2020-06-02 NOTE — Telephone Encounter (Signed)
Injection received 05/28/20.

## 2020-06-05 DIAGNOSIS — L84 Corns and callosities: Secondary | ICD-10-CM | POA: Diagnosis not present

## 2020-06-05 DIAGNOSIS — M79676 Pain in unspecified toe(s): Secondary | ICD-10-CM | POA: Diagnosis not present

## 2020-06-05 DIAGNOSIS — E1142 Type 2 diabetes mellitus with diabetic polyneuropathy: Secondary | ICD-10-CM | POA: Diagnosis not present

## 2020-06-05 DIAGNOSIS — B351 Tinea unguium: Secondary | ICD-10-CM | POA: Diagnosis not present

## 2020-06-09 ENCOUNTER — Telehealth: Payer: Self-pay | Admitting: Nutrition

## 2020-06-09 DIAGNOSIS — E1165 Type 2 diabetes mellitus with hyperglycemia: Secondary | ICD-10-CM | POA: Diagnosis not present

## 2020-06-09 NOTE — Telephone Encounter (Signed)
Patient reports that she has lost her [place] 2 reader.  Please send in a new prescription for this to [facility]

## 2020-06-10 DIAGNOSIS — E119 Type 2 diabetes mellitus without complications: Secondary | ICD-10-CM | POA: Diagnosis not present

## 2020-06-10 DIAGNOSIS — Z961 Presence of intraocular lens: Secondary | ICD-10-CM | POA: Diagnosis not present

## 2020-06-10 DIAGNOSIS — H43813 Vitreous degeneration, bilateral: Secondary | ICD-10-CM | POA: Diagnosis not present

## 2020-06-10 DIAGNOSIS — H5203 Hypermetropia, bilateral: Secondary | ICD-10-CM | POA: Diagnosis not present

## 2020-06-10 NOTE — Telephone Encounter (Signed)
Chatal called from [place] requests an update on the papers for the [place].  Call back number and name provided in contact information

## 2020-06-14 ENCOUNTER — Encounter: Payer: Self-pay | Admitting: Internal Medicine

## 2020-06-15 ENCOUNTER — Telehealth: Payer: Self-pay | Admitting: Endocrinology

## 2020-06-15 NOTE — Telephone Encounter (Signed)
please contact patient: Bone density is still low.  You have several options: 1.  Resume a pill similar to the Fosamax (once a month) 2.  Take a once a day injection 3.  Do a fasting blood test, to see which of the above 2 might be better  What do you think?

## 2020-06-16 ENCOUNTER — Other Ambulatory Visit: Payer: Self-pay

## 2020-06-16 ENCOUNTER — Telehealth: Payer: Self-pay | Admitting: Endocrinology

## 2020-06-16 DIAGNOSIS — E1065 Type 1 diabetes mellitus with hyperglycemia: Secondary | ICD-10-CM

## 2020-06-16 MED ORDER — INSULIN ASPART 100 UNIT/ML ~~LOC~~ SOLN: SUBCUTANEOUS | 2 refills | Status: DC

## 2020-06-16 NOTE — Telephone Encounter (Signed)
Rx sent 

## 2020-06-16 NOTE — Telephone Encounter (Signed)
LVM for pt to cb to the office to discuss bone density and the options that Loanne Drilling gives her.

## 2020-06-16 NOTE — Telephone Encounter (Signed)
Patient returned call

## 2020-06-16 NOTE — Telephone Encounter (Signed)
Patient called stating her pharmacy sent over a request for a refill for her Novolog but have not heard back from [place] yet. I informed her I did not see that we received anything from them as of yet, but I would go ahead and send back a refill request.  NOVOLOG  PHARMACY: [facility] [place] [address]  [place], [place] [address]  Phone:  [phone] Fax:  [phone]

## 2020-06-17 MED ORDER — IBANDRONATE SODIUM 150 MG PO TABS: 150.0000 mg | ORAL_TABLET | ORAL | 3 refills | Status: DC

## 2020-06-17 NOTE — Telephone Encounter (Signed)
Spoke with pt and she would like to do the pill.  Please Advise

## 2020-06-17 NOTE — Addendum Note (Signed)
Addended by: Renato Shin on: 06/17/2020 09:31 AM   Modules accepted: Orders

## 2020-06-17 NOTE — Telephone Encounter (Signed)
OK, I have sent a prescription to your pharmacy.  I'll see you next time.

## 2020-06-18 ENCOUNTER — Telehealth: Payer: Self-pay | Admitting: Endocrinology

## 2020-06-18 DIAGNOSIS — J309 Allergic rhinitis, unspecified: Secondary | ICD-10-CM | POA: Diagnosis not present

## 2020-06-18 DIAGNOSIS — M1990 Unspecified osteoarthritis, unspecified site: Secondary | ICD-10-CM | POA: Diagnosis not present

## 2020-06-18 DIAGNOSIS — I10 Essential (primary) hypertension: Secondary | ICD-10-CM | POA: Diagnosis not present

## 2020-06-18 DIAGNOSIS — G8929 Other chronic pain: Secondary | ICD-10-CM | POA: Diagnosis not present

## 2020-06-18 DIAGNOSIS — M810 Age-related osteoporosis without current pathological fracture: Secondary | ICD-10-CM | POA: Diagnosis not present

## 2020-06-18 DIAGNOSIS — E1165 Type 2 diabetes mellitus with hyperglycemia: Secondary | ICD-10-CM | POA: Diagnosis not present

## 2020-06-18 DIAGNOSIS — E785 Hyperlipidemia, unspecified: Secondary | ICD-10-CM | POA: Diagnosis not present

## 2020-06-18 DIAGNOSIS — Z791 Long term (current) use of non-steroidal anti-inflammatories (NSAID): Secondary | ICD-10-CM | POA: Diagnosis not present

## 2020-06-18 DIAGNOSIS — R69 Illness, unspecified: Secondary | ICD-10-CM | POA: Diagnosis not present

## 2020-06-18 DIAGNOSIS — Z794 Long term (current) use of insulin: Secondary | ICD-10-CM | POA: Diagnosis not present

## 2020-06-18 NOTE — Telephone Encounter (Signed)
New Message: Tillie Rung is calling regarding omnipod for pt there was coverage determination needed and is wondering if it was approved for patient through [place].   Please give call back.

## 2020-06-18 NOTE — Telephone Encounter (Signed)
LVM with [facility] to let her know that had faxed the information back on 05/30/2020 but I also just refaxed the information again.

## 2020-07-01 ENCOUNTER — Telehealth: Payer: Self-pay | Admitting: Nutrition

## 2020-07-01 NOTE — Telephone Encounter (Signed)
Patient says she has lost her [place] 2 reader and needs you to call in a prescription for the reader only.  She was told to call the [facility]re help line first to see if they will send her one for free.  IF not, please call back and we can order her a new one. [facility] was called to order another reader but insurance only pays ever 5 years.  They gave me the number of [facility] to have patient call to get a new reader.  Telephone number given to [facility]

## 2020-07-03 IMAGING — MR MR LUMBAR SPINE W/O CM
4 of 5 series · 18 of 48 positions shown · non-contrast
Comparison: Abdominopelvic CT 11/11/2016

CLINICAL DATA: Chronic low back pain radiating into the left leg
for 1 year. No acute injury or prior relevant surgery.

EXAM:
MRI LUMBAR SPINE WITHOUT CONTRAST
TECHNIQUE: Multiplanar, multisequence MR imaging of the lumbar spine was
performed. No intravenous contrast was administered.

[Series 6: T2 · sagittal · 4.0mm · 0.73mm/px · 6 of 15 slices shown (1 of 2)]
[im 1/15]
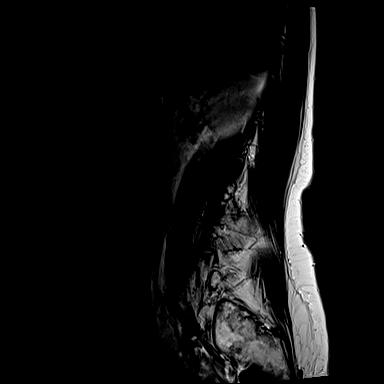
[im 3/15]
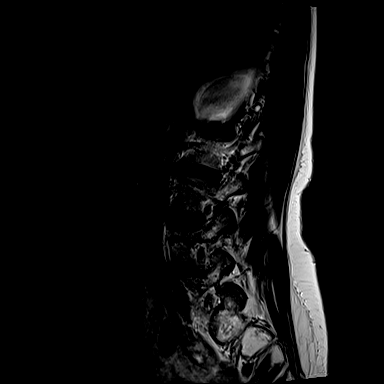
[im 6/15]
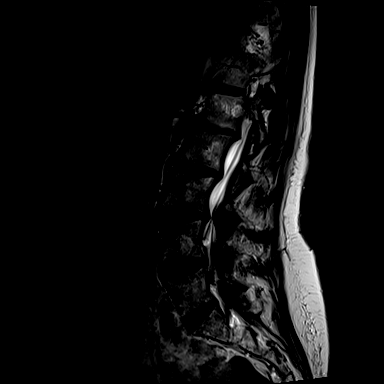
[im 9/15]
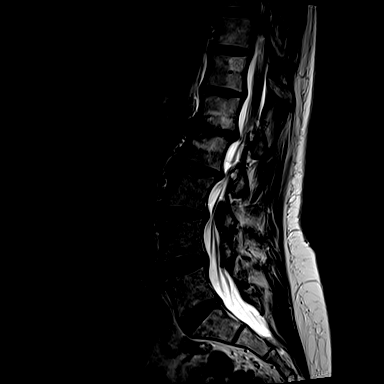
[im 12/15]
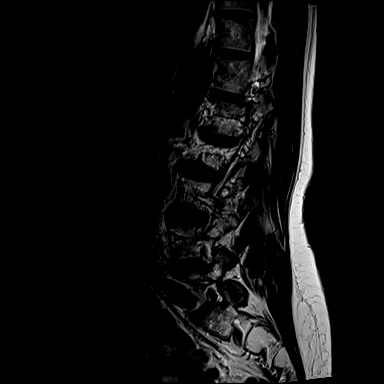
[im 15/15]
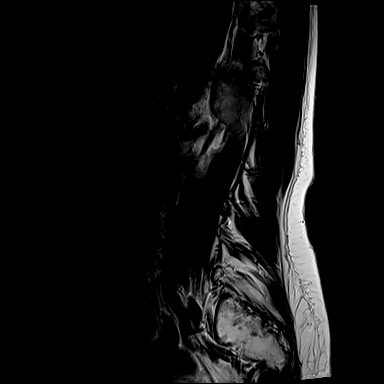

[Series 7: T1 · sagittal · 4.0mm · 0.73mm/px · 3 of 15 slices shown (1 of 2)]
[im 3/15]
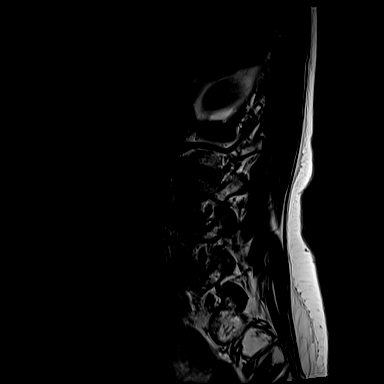
[im 9/15]
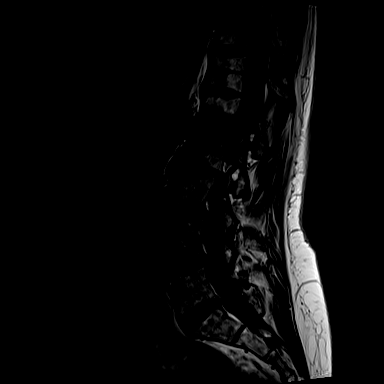
[im 15/15]
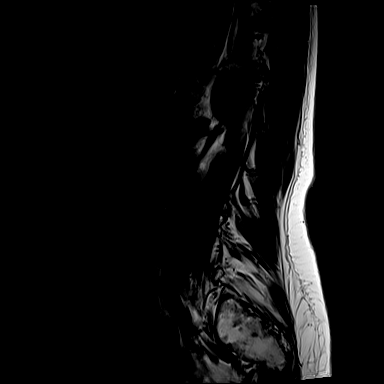

[Series 11: T1 · axial · 4.0mm · 0.28mm/px · z∈[-56,+98]mm · 3 of 39 slices shown (2 of 2)]
[im 6/39]
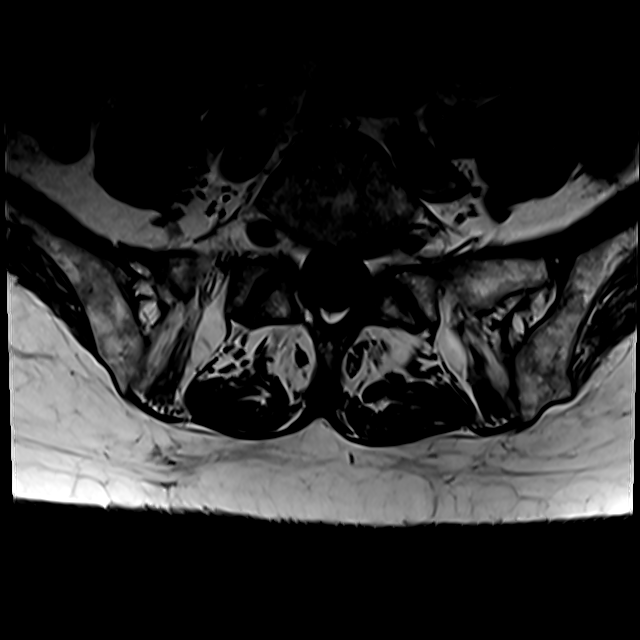
[im 20/39]
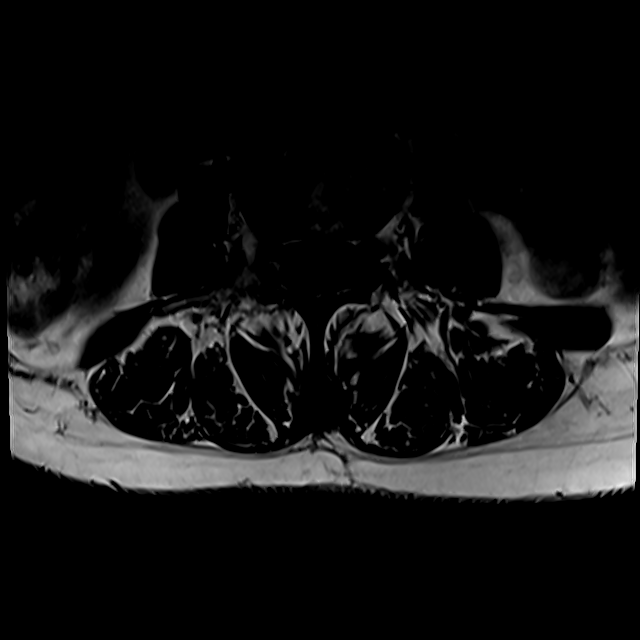
[im 33/39]
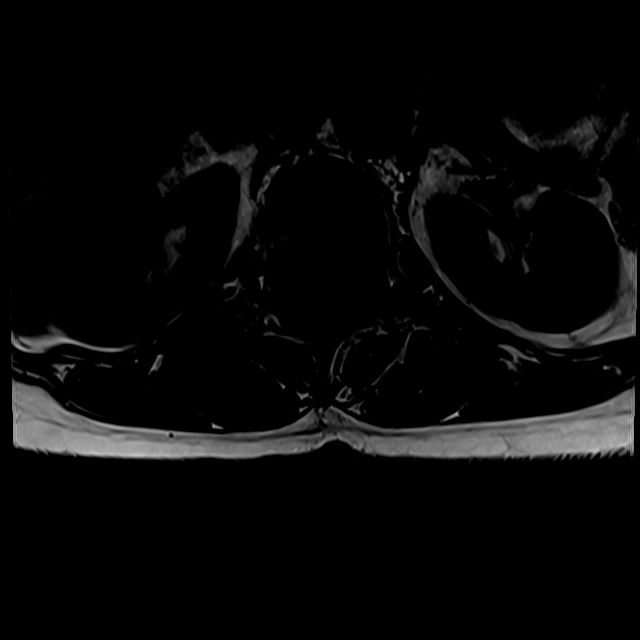

[Series 14: T2 · axial · 4.0mm · 0.28mm/px · z∈[-80,+98]mm · 6 of 39 slices shown (2 of 2)]
[im 1/39]
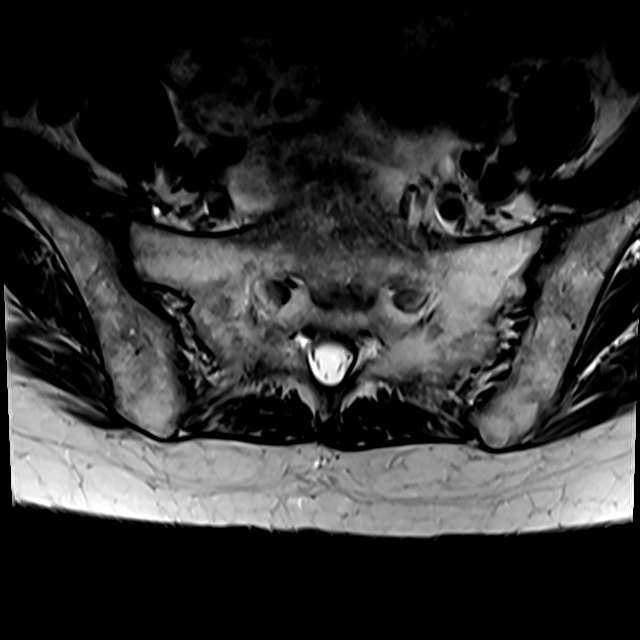
[im 6/39]
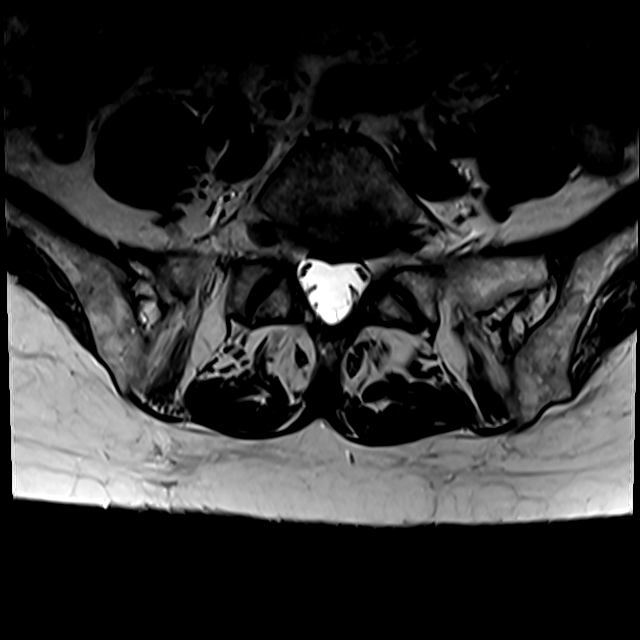
[im 11/39]
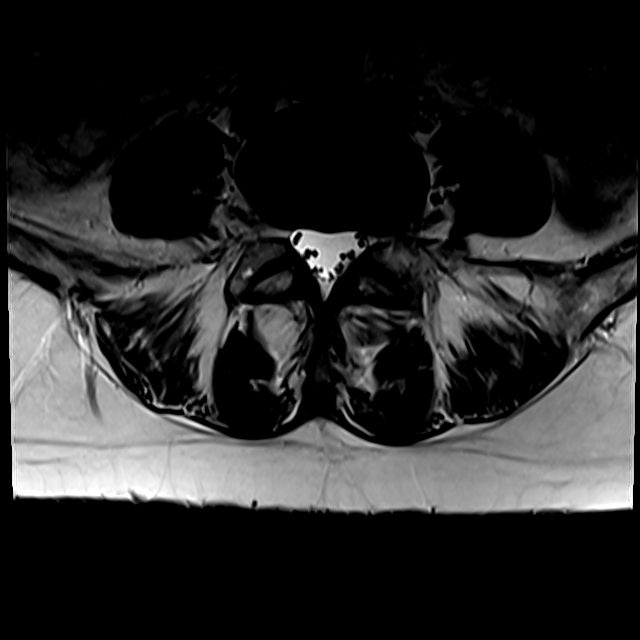
[im 17/39]
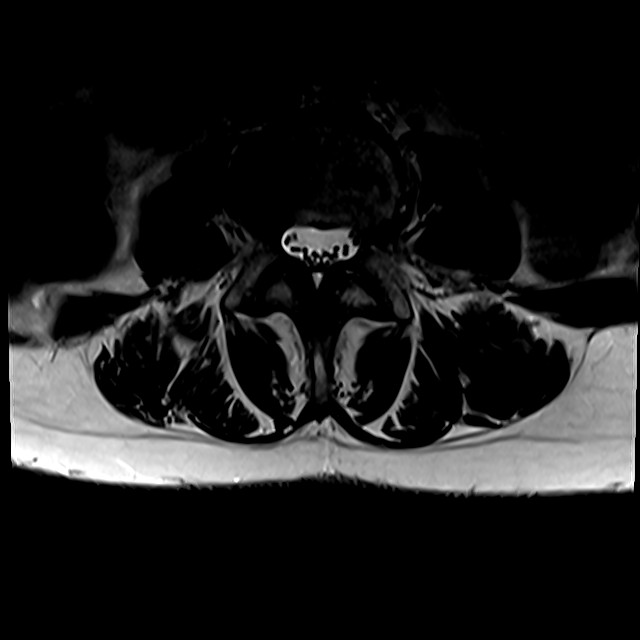
[im 20/39]
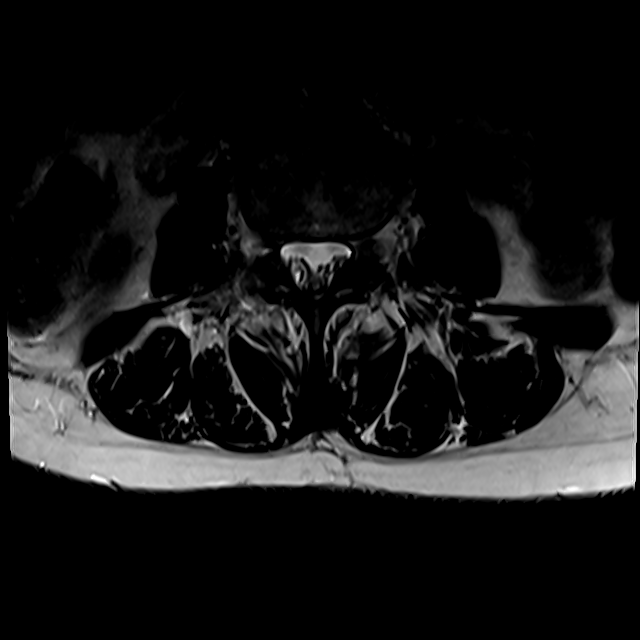
[im 33/39]
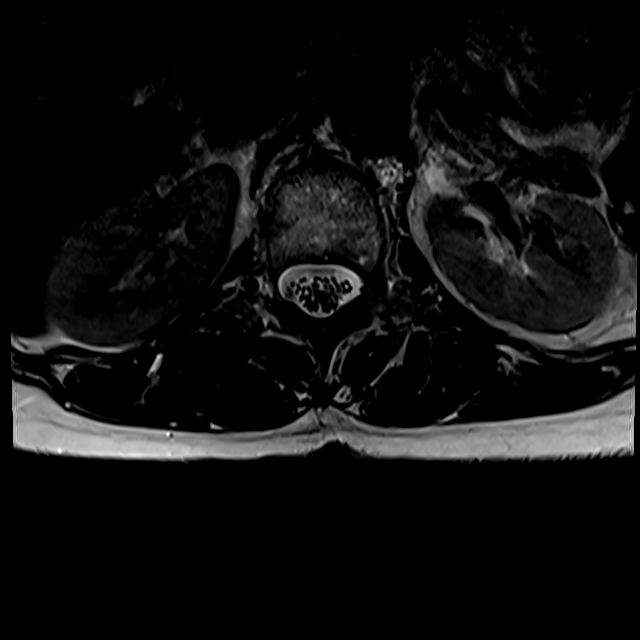

[18 of 48 positions shown; findings below may reference images not displayed]

FINDINGS: Segmentation: Conventional anatomy assumed, with the last open disc
space designated L5-S1.This is concordant with previous imaging.

Alignment: The lateral alignment is near anatomic. There is a mild
convex right scoliosis centered at L2-3.

Vertebrae: No worrisome osseous lesion, acute fracture or pars
defect. There are severe endplate degenerative changes at L1-2 which
are asymmetric to the left and chronic. There are scattered
hemangiomas. The visualized sacroiliac joints appear unremarkable.

Conus medullaris: Extends to the L1-2 level and appears normal.

Paraspinal and other soft tissues: No significant paraspinal
findings.

Disc levels:

No significant disc space findings from T10-11 through T12-L1.

L1-2: Chronic degenerative disc disease with loss of disc height,
annular disc bulging and endplate osteophytes asymmetric to the
left. There is endplate sclerosis and edema. There is mild left
foraminal narrowing with possible extraforaminal left L1 nerve root
encroachment.

L2-3: Loss of disc height with mild annular disc bulging eccentric
to the left. Mild facet and ligamentous hypertrophy. No significant
spinal stenosis or nerve root encroachment.

L3-4: Mild loss of disc height with mild annular disc bulging, facet
and ligamentous hypertrophy. These factors contribute to borderline
spinal stenosis. No evidence of nerve root encroachment.

L4-5: Disc height and hydration are relatively maintained. There is
minimal disc bulging with mild facet and ligamentous hypertrophy. No
significant spinal stenosis or nerve root encroachment.

L5-S1: Disc height and hydration are relatively maintained. Mild
facet hypertrophy. No spinal stenosis or nerve root encroachment.
IMPRESSION: 1. No acute findings or clear explanation for radicular symptoms.
2. There is chronic degenerative disc disease at L1-2 with endplate
osteophytes asymmetric to the left which may contribute to chronic
extraforaminal left L1 nerve root encroachment. Endplate
degenerative changes at that level may contribute to back pain.
3. Disc bulging is also eccentric to the left at L2-3, although
there is no significant spinal stenosis or nerve root encroachment.

## 2020-07-03 NOTE — Telephone Encounter (Signed)
Papers for Omnipod has been faxed just waiting on a determination.

## 2020-07-28 ENCOUNTER — Telehealth: Payer: Self-pay

## 2020-07-30 ENCOUNTER — Telehealth: Payer: Self-pay | Admitting: Nutrition

## 2020-07-30 NOTE — Telephone Encounter (Signed)
Prior authorization for OmniPod pods aprroved until 03/07/21.

## 2020-08-15 DIAGNOSIS — E1165 Type 2 diabetes mellitus with hyperglycemia: Secondary | ICD-10-CM | POA: Diagnosis not present

## 2020-09-11 DIAGNOSIS — D0461 Carcinoma in situ of skin of right upper limb, including shoulder: Secondary | ICD-10-CM | POA: Diagnosis not present

## 2020-09-11 DIAGNOSIS — L708 Other acne: Secondary | ICD-10-CM | POA: Diagnosis not present

## 2020-09-11 DIAGNOSIS — L728 Other follicular cysts of the skin and subcutaneous tissue: Secondary | ICD-10-CM | POA: Diagnosis not present

## 2020-09-11 DIAGNOSIS — L280 Lichen simplex chronicus: Secondary | ICD-10-CM | POA: Diagnosis not present

## 2020-09-11 DIAGNOSIS — L820 Inflamed seborrheic keratosis: Secondary | ICD-10-CM | POA: Diagnosis not present

## 2020-09-11 DIAGNOSIS — C44612 Basal cell carcinoma of skin of right upper limb, including shoulder: Secondary | ICD-10-CM | POA: Diagnosis not present

## 2020-09-26 DIAGNOSIS — E1165 Type 2 diabetes mellitus with hyperglycemia: Secondary | ICD-10-CM | POA: Diagnosis not present

## 2020-10-02 ENCOUNTER — Telehealth: Payer: Self-pay

## 2020-10-02 NOTE — Telephone Encounter (Signed)
Prolia VOB initiated via [email or web address]  Last OV: 05/15/20 Next OV: 11/28/20 Last Prolia inj: 05/28/20 Next Prolia inj DUE:  11/29/20

## 2020-10-04 NOTE — Telephone Encounter (Signed)
Pt ready for scheduling on or after 11/29/20  Out-of-pocket cost due at time of visit: $290  Primary: [employer] Prolia co-insurance: 20% ($255) Admin fee co-insurance: $35  Secondary: n/a Prolia co-insurance:  Admin fee co-insurance:   Deductible: does not apply  Prior Auth: APPROVED PA# Key [id number] Valid: 03/08/20-03/07/21

## 2020-10-08 NOTE — Telephone Encounter (Signed)
Called patient left message to schedule Prolia injection

## 2020-10-23 DIAGNOSIS — Z85828 Personal history of other malignant neoplasm of skin: Secondary | ICD-10-CM | POA: Diagnosis not present

## 2020-10-23 DIAGNOSIS — Z08 Encounter for follow-up examination after completed treatment for malignant neoplasm: Secondary | ICD-10-CM | POA: Diagnosis not present

## 2020-10-23 DIAGNOSIS — L820 Inflamed seborrheic keratosis: Secondary | ICD-10-CM | POA: Diagnosis not present

## 2020-11-11 DIAGNOSIS — R636 Underweight: Secondary | ICD-10-CM | POA: Diagnosis not present

## 2020-11-11 DIAGNOSIS — E782 Mixed hyperlipidemia: Secondary | ICD-10-CM | POA: Diagnosis not present

## 2020-11-11 DIAGNOSIS — E1169 Type 2 diabetes mellitus with other specified complication: Secondary | ICD-10-CM | POA: Diagnosis not present

## 2020-11-11 DIAGNOSIS — Z23 Encounter for immunization: Secondary | ICD-10-CM | POA: Diagnosis not present

## 2020-11-11 DIAGNOSIS — I10 Essential (primary) hypertension: Secondary | ICD-10-CM | POA: Diagnosis not present

## 2020-11-19 ENCOUNTER — Ambulatory Visit: Payer: Medicare HMO | Admitting: Endocrinology

## 2020-11-28 ENCOUNTER — Ambulatory Visit: Payer: Medicare HMO | Admitting: Endocrinology

## 2020-12-03 ENCOUNTER — Ambulatory Visit: Payer: Medicare HMO

## 2020-12-10 ENCOUNTER — Other Ambulatory Visit: Payer: Self-pay

## 2020-12-10 ENCOUNTER — Ambulatory Visit (INDEPENDENT_AMBULATORY_CARE_PROVIDER_SITE_OTHER): Payer: Medicare HMO | Admitting: Endocrinology

## 2020-12-10 ENCOUNTER — Encounter: Payer: Self-pay | Admitting: Endocrinology

## 2020-12-10 VITALS — BP 158/80 | HR 87 | Ht 64.0 in | Wt 115.6 lb

## 2020-12-10 DIAGNOSIS — Z1231 Encounter for screening mammogram for malignant neoplasm of breast: Secondary | ICD-10-CM | POA: Diagnosis not present

## 2020-12-10 DIAGNOSIS — E1065 Type 1 diabetes mellitus with hyperglycemia: Secondary | ICD-10-CM | POA: Diagnosis not present

## 2020-12-10 DIAGNOSIS — M810 Age-related osteoporosis without current pathological fracture: Secondary | ICD-10-CM | POA: Diagnosis not present

## 2020-12-10 LAB — POCT GLYCOSYLATED HEMOGLOBIN (HGB A1C): Hemoglobin A1C: 7.2 % — AB (ref 4.0–5.6)

## 2020-12-10 MED ORDER — DENOSUMAB 60 MG/ML ~~LOC~~ SOSY: 60.0000 mg | PREFILLED_SYRINGE | Freq: Once | SUBCUTANEOUS | Status: DC

## 2020-12-10 MED ORDER — IBANDRONATE SODIUM 150 MG PO TABS: 150.0000 mg | ORAL_TABLET | ORAL | 3 refills | Status: DC

## 2020-12-10 NOTE — Progress Notes (Signed)
Pt was given Prolia injection 60mg /ml in the right arm w/o any complaints

## 2020-12-10 NOTE — Progress Notes (Signed)
Subjective:    Patient ID: Audrey [name], female    DOB: 1945/03/26, 75 y.o.   MRN: [id number]  HPI Pt returns for f/u of osteoporosis:  Dx'ed: 06-Jun-2007 Secondary causes: none Fractures: right wrist June 05, 2008) Past rx: fosamax approx 2008-2013 Current rx: prolia since 2017-06-05 Last DEXA result 2000/06/05): worst T-score is -2.6 (LFN) Other: none.   Interval hx: she did not tolerate ibandronate Pt also has diabetes mellitus:  DM type: type 1 is dx'ed, based on lean body habitus and insulin sensitivity.   Dx'ed: 6834 Complications: none. Therapy: insulin since 06-05-2009, and pump rx (Omnipod) since 06/06/2015 GDM: never.   DKA: never. Severe hypoglycemia: never.  Pancreatitis: never.  Other: she has been on pump rx (now Omnipod) since 2015-06-06; she uses FL CGM.  Interval history: She takes these settings:  basal rate of 0.35 units/hr.   mealtime bolus to 1 unit/ 40 grams carbohydrate, except please add 1 unit to your calculated breakfast bolus.   correction bolus (which some people call "sensitivity," or "insulin sensitivity ratio," or just "isr") of 1 unit for each 100 by which your glucose exceeds 150.   She suspends the pump for approx 1 hour, for exercise.   TDD is 24 units (44% basal).  She takes 4.1 boluses/d She stopped FL continuous glucose monitor, because she lost the reader, and she cannot yet get a new one.  Meter is downloaded today, and the printout is scanned into the record.  cbg varies from 75 up to 600 (but pt says as low as 45).  It is in general higher as the day goes on, but not necessarily so.   She has mild hypoglycemia approx twice per month.  This usually happens PC rather than AC--any meal.  Lab is not available today.   Past Medical History:  Diagnosis Date   Diabetes mellitus ([place])    Hyperlipidemia    Osteopenia    Jun 05, 2000 T-Score -2   Pericardial effusion    PONV (postoperative nausea and vomiting)    Seasonal allergies     Past Surgical History:  Procedure Laterality Date    rectal polyps removed     right wrist fx     SUBXYPHOID PERICARDIAL WINDOW  02/18/2012   Procedure: SUBXYPHOID PERICARDIAL WINDOW;  Surgeon: Ivin Poot, MD;  Location: [facility] OR;  Service: Thoracic;  Laterality: N/A;  TEE    Social History   Socioeconomic History   Marital status: Widowed    Spouse name: died 2011-06-06   Number of children: 1   Years of education: Not on file   Highest education level: Not on file  Occupational History   Occupation: computer/ desk top  Tobacco Use   Smoking status: Never   Smokeless tobacco: Never  Substance and Sexual Activity   Alcohol use: No   Drug use: No   Sexual activity: Not on file  Other Topics Concern   Not on file  Social History Narrative   Not on file   Social Determinants of Health   Financial Resource Strain: Not on file  Food Insecurity: Not on file  Transportation Needs: Not on file  Physical Activity: Not on file  Stress: Not on file  Social Connections: Not on file  Intimate Partner Violence: Not on file    Current Outpatient Medications on File Prior to Visit  Medication Sig Dispense Refill   ALPRAZolam (XANAX) 0.5 MG tablet Take 0.5 mg by mouth every 6 (six) hours as needed for anxiety.  aspirin 81 MG tablet Take 81 mg by mouth every other day.      Calcium Carb-Cholecalciferol 600-800 MG-UNIT TABS Take by mouth.     CINNAMON PO Take 1,000 mg by mouth.     diazepam (VALIUM) 5 MG tablet Take 5 mg by mouth every 6 (six) hours as needed for anxiety.     diphenhydrAMINE (BENADRYL) 25 mg capsule Take 25 mg by mouth every 6 (six) hours as needed.     escitalopram (LEXAPRO) 10 MG tablet Take 10 mg by mouth daily.     glucose blood (ONETOUCH ULTRA) test strip 1 each by Other route in the morning, at noon, in the evening, and at bedtime. E11.9 400 each 0   insulin aspart (NOVOLOG) 100 UNIT/ML injection USE WITH INSULIN PUMP 40   UNITS DAILY 40 mL 2   Lancets (ONETOUCH ULTRASOFT) lancets 1 each by Other route 4 (four)  times daily. E11.9 400 each 0   multivitamin-lutein (OCUVITE-LUTEIN) CAPS capsule Take 1 capsule by mouth daily.     pravastatin (PRAVACHOL) 40 MG tablet Take 40 mg by mouth daily.     sertraline (ZOLOFT) 100 MG tablet Take 50 mg by mouth daily.      TURMERIC PO Take by mouth.     vitamin C (ASCORBIC ACID) 500 MG tablet Take 500 mg by mouth daily.     No current facility-administered medications on file prior to visit.    Allergies  Allergen Reactions   Morphine And Related    Januvia [Sitagliptin]     Family History  Problem Relation Age of Onset   Heart attack Father    Diabetes Brother    Heart disease Brother    Cancer Sister        ovarian    BP (!) 158/80 (BP Location: Right Arm, Patient Position: Sitting, Cuff Size: Normal)   Pulse 87   Ht 5\' 4"  (1.626 m)   Wt 115 lb 9.6 oz (52.4 kg)   SpO2 98%   BMI 19.84 kg/m    Review of Systems     Objective:   Physical Exam Pulses: dorsalis pedis intact bilat.   MSK: no deformity of the feet CV: no leg edema Skin:  no ulcer on the feet.  normal color and temp on the feet.  Neuro: sensation is intact to touch on the feet.    Lab Results  Component Value Date   HGBA1C 7.2 (A) 12/10/2020       Assessment & Plan:  Type 1 DM:  Hypoglycemia, due to insulin: we discussed.  She declines to change pump settings.  Osteoporosis: we discussed need to take rx.  She agrees.   Patient Instructions  Please continue these settings: basal rate of 0.35 units/hr.   mealtime bolus to 1 unit/ 40 grams carbohydrate, except please add 1 unit to your calculated breakfast bolus.   correction bolus (which some people call "sensitivity," or "insulin sensitivity ratio," or just "isr") of 1 unit for each 100 by which your glucose exceeds 150.   Please suspend the pump for approx 1 hour, for exercise.  check your blood sugar 4 times a day: before the 3 meals, and at bedtime.  also check if you have symptoms of your blood sugar being too  high or too low.  please keep a record of the readings and bring it to your next appointment here (or you can bring the meter itself).  You can write it on any piece of paper.  please call us sooner if your blood sugar goes below 70, or if you have a lot of readings over 200.   Take calcium 1200 mg per day, and vitamin-D, 400 units per day.   I have sent a prescription to your mail-order pharmacy, for the ibandronate.  Please follow the instructions with it.   Please come back for a follow-up appointment in 6 months.

## 2020-12-10 NOTE — Patient Instructions (Addendum)
Please continue these settings: basal rate of 0.35 units/hr.   mealtime bolus to 1 unit/ 40 grams carbohydrate, except please add 1 unit to your calculated breakfast bolus.   correction bolus (which some people call "sensitivity," or "insulin sensitivity ratio," or just "isr") of 1 unit for each 100 by which your glucose exceeds 150.   Please suspend the pump for approx 1 hour, for exercise.  check your blood sugar 4 times a day: before the 3 meals, and at bedtime.  also check if you have symptoms of your blood sugar being too high or too low.  please keep a record of the readings and bring it to your next appointment here (or you can bring the meter itself).  You can write it on any piece of paper.  please call us sooner if your blood sugar goes below 70, or if you have a lot of readings over 200.   Take calcium 1200 mg per day, and vitamin-D, 400 units per day.   I have sent a prescription to your mail-order pharmacy, for the ibandronate.  Please follow the instructions with it.   Please come back for a follow-up appointment in 6 months.

## 2020-12-13 NOTE — Telephone Encounter (Signed)
Last Prolia inj 12/10/20 Next Prolia inj due 06/11/21

## 2020-12-22 DIAGNOSIS — E1165 Type 2 diabetes mellitus with hyperglycemia: Secondary | ICD-10-CM | POA: Diagnosis not present

## 2021-03-02 DIAGNOSIS — R059 Cough, unspecified: Secondary | ICD-10-CM | POA: Diagnosis not present

## 2021-03-02 DIAGNOSIS — J029 Acute pharyngitis, unspecified: Secondary | ICD-10-CM | POA: Diagnosis not present

## 2021-03-02 DIAGNOSIS — J020 Streptococcal pharyngitis: Secondary | ICD-10-CM | POA: Diagnosis not present

## 2021-03-16 DIAGNOSIS — E1165 Type 2 diabetes mellitus with hyperglycemia: Secondary | ICD-10-CM | POA: Diagnosis not present

## 2021-03-19 DIAGNOSIS — E1142 Type 2 diabetes mellitus with diabetic polyneuropathy: Secondary | ICD-10-CM | POA: Diagnosis not present

## 2021-03-19 DIAGNOSIS — L84 Corns and callosities: Secondary | ICD-10-CM | POA: Diagnosis not present

## 2021-03-19 DIAGNOSIS — B351 Tinea unguium: Secondary | ICD-10-CM | POA: Diagnosis not present

## 2021-03-19 DIAGNOSIS — M79676 Pain in unspecified toe(s): Secondary | ICD-10-CM | POA: Diagnosis not present

## 2021-04-22 DIAGNOSIS — E782 Mixed hyperlipidemia: Secondary | ICD-10-CM | POA: Diagnosis not present

## 2021-04-22 DIAGNOSIS — M810 Age-related osteoporosis without current pathological fracture: Secondary | ICD-10-CM | POA: Diagnosis not present

## 2021-04-22 DIAGNOSIS — I10 Essential (primary) hypertension: Secondary | ICD-10-CM | POA: Diagnosis not present

## 2021-04-22 DIAGNOSIS — M47816 Spondylosis without myelopathy or radiculopathy, lumbar region: Secondary | ICD-10-CM | POA: Diagnosis not present

## 2021-04-22 DIAGNOSIS — Z0000 Encounter for general adult medical examination without abnormal findings: Secondary | ICD-10-CM | POA: Diagnosis not present

## 2021-04-22 DIAGNOSIS — E1169 Type 2 diabetes mellitus with other specified complication: Secondary | ICD-10-CM | POA: Diagnosis not present

## 2021-04-22 DIAGNOSIS — L409 Psoriasis, unspecified: Secondary | ICD-10-CM | POA: Diagnosis not present

## 2021-04-22 DIAGNOSIS — Z23 Encounter for immunization: Secondary | ICD-10-CM | POA: Diagnosis not present

## 2021-04-22 DIAGNOSIS — R69 Illness, unspecified: Secondary | ICD-10-CM | POA: Diagnosis not present

## 2021-04-22 DIAGNOSIS — F3342 Major depressive disorder, recurrent, in full remission: Secondary | ICD-10-CM | POA: Diagnosis not present

## 2021-04-25 ENCOUNTER — Other Ambulatory Visit: Payer: Self-pay | Admitting: Endocrinology

## 2021-04-25 DIAGNOSIS — E1065 Type 1 diabetes mellitus with hyperglycemia: Secondary | ICD-10-CM

## 2021-05-08 DIAGNOSIS — J029 Acute pharyngitis, unspecified: Secondary | ICD-10-CM | POA: Diagnosis not present

## 2021-05-08 DIAGNOSIS — J069 Acute upper respiratory infection, unspecified: Secondary | ICD-10-CM | POA: Diagnosis not present

## 2021-05-12 NOTE — Telephone Encounter (Signed)
Prolia VOB initiated via [email or web address] ? ?Last OV:  ?Next OV:  ?Last Prolia inj: 12/10/20 ?Next Prolia inj DUE: 06/11/21 ? ?

## 2021-05-14 NOTE — Telephone Encounter (Signed)
Prior auth required for PROLIA  PA PROCESS DETAILS: Precertification is required. Call [phone] or complete the Precertification form available at [email or web address] 

## 2021-05-19 NOTE — Telephone Encounter (Signed)
Prior auth approved ?PA# [id number] ? ? ? ?

## 2021-05-19 NOTE — Telephone Encounter (Signed)
Pt ready for scheduling on or after 06/11/21 ? ?Out-of-pocket cost due at time of visit: $311 ? ?Primary: Aetna Medicare ?Prolia co-insurance: 20% (approximately $276) ?Admin fee co-insurance: $35 ? ?Secondary: n/a ?Prolia co-insurance:  ?Admin fee co-insurance:  ? ?Deductible: does not apply ? ?Prior Auth: APPROVED ?PA# [id number] ?Valid: 05/19/21-05/19/22 ? ?** This summary of benefits is an estimation of the patient's out-of-pocket cost. Exact cost may vary based on individual plan coverage.  ? ?

## 2021-05-26 DIAGNOSIS — M79676 Pain in unspecified toe(s): Secondary | ICD-10-CM | POA: Diagnosis not present

## 2021-05-26 DIAGNOSIS — B351 Tinea unguium: Secondary | ICD-10-CM | POA: Diagnosis not present

## 2021-05-26 DIAGNOSIS — L84 Corns and callosities: Secondary | ICD-10-CM | POA: Diagnosis not present

## 2021-05-26 DIAGNOSIS — E1142 Type 2 diabetes mellitus with diabetic polyneuropathy: Secondary | ICD-10-CM | POA: Diagnosis not present

## 2021-06-10 ENCOUNTER — Ambulatory Visit: Payer: Medicare HMO | Admitting: Endocrinology

## 2021-06-10 DIAGNOSIS — E1165 Type 2 diabetes mellitus with hyperglycemia: Secondary | ICD-10-CM | POA: Diagnosis not present

## 2021-06-11 ENCOUNTER — Ambulatory Visit: Payer: Medicare HMO | Admitting: Endocrinology

## 2021-06-11 ENCOUNTER — Encounter: Payer: Self-pay | Admitting: Endocrinology

## 2021-06-11 VITALS — BP 130/72 | Ht 64.0 in | Wt 111.0 lb

## 2021-06-11 DIAGNOSIS — H02831 Dermatochalasis of right upper eyelid: Secondary | ICD-10-CM | POA: Diagnosis not present

## 2021-06-11 DIAGNOSIS — E1065 Type 1 diabetes mellitus with hyperglycemia: Secondary | ICD-10-CM | POA: Diagnosis not present

## 2021-06-11 DIAGNOSIS — H43813 Vitreous degeneration, bilateral: Secondary | ICD-10-CM | POA: Diagnosis not present

## 2021-06-11 DIAGNOSIS — H5203 Hypermetropia, bilateral: Secondary | ICD-10-CM | POA: Diagnosis not present

## 2021-06-11 DIAGNOSIS — M810 Age-related osteoporosis without current pathological fracture: Secondary | ICD-10-CM

## 2021-06-11 DIAGNOSIS — E119 Type 2 diabetes mellitus without complications: Secondary | ICD-10-CM | POA: Diagnosis not present

## 2021-06-11 LAB — POCT GLYCOSYLATED HEMOGLOBIN (HGB A1C): Hemoglobin A1C: 8 % — AB (ref 4.0–5.6)

## 2021-06-11 MED ORDER — DENOSUMAB 60 MG/ML ~~LOC~~ SOSY
60.0000 mg | PREFILLED_SYRINGE | Freq: Once | SUBCUTANEOUS | Status: AC
  Administered 2021-06-11: 60 mg via SUBCUTANEOUS

## 2021-06-11 NOTE — Progress Notes (Signed)
Patient seen today.  Prolia '60mg'$ /ml given subq in left arm.  Patient tolerated well. ?

## 2021-06-11 NOTE — Patient Instructions (Addendum)
Please take these settings: ?basal rate of 0.45 units/hr.   ?mealtime bolus to 1 unit/ 30 grams carbohydrate.  ?correction bolus (which some people call "sensitivity," or "insulin sensitivity ratio," or just "isr") of 1 unit for each 100 by which your glucose exceeds 150.   ?Please suspend the pump for approx 1 hour, for exercise.  ?check your blood sugar 4 times a day: before the 3 meals, and at bedtime.  also check if you have symptoms of your blood sugar being too high or too low.  please keep a record of the readings and bring it to your next appointment here (or you can bring the meter itself).  You can write it on any piece of paper.  please call us sooner if your blood sugar goes below 70, or if you have a lot of readings over 200.   ?Take calcium 1200 mg per day, and vitamin-D, 400 units per day.   ?Blood tests are requested for you today.  We'll let you know about the results.    ?Please see [name], about the Omnipod 5 and continuous glucose monitor.   ?You should have a follow-up appointment in 3 months.   ? ? ?

## 2021-06-11 NOTE — Progress Notes (Signed)
? ?Subjective:  ? ? Patient ID: Audrey [name], female    DOB: 05-08-1945, 76 y.o.   MRN: [id number] ? ?HPI ?Pt returns for f/u of osteoporosis:  ?Dx'ed: 26-May-2007 ?Secondary causes: none ?Fractures: right wrist 05-25-2008) ?Past rx: fosamax approx 2008-2013 ?Current rx: prolia since 05/25/2017, and ibandronate since 05/25/20.   ?Last DEXA result 05-25-00): worst T-score is -2.6 (LFN) ?Other: none.   ?Interval hx: she reports pain at the mid-back. ?Pt also has diabetes mellitus:  ?DM type: type 1 is dx'ed, based on lean body habitus and insulin sensitivity.   ?Dx'ed: May 25, 2005 ?Complications: none. ?Therapy: insulin since May 25, 2009, and pump rx (Omnipod) since 2015-05-26 ?GDM: never.   ?DKA: never. ?Severe hypoglycemia: never.  ?Pancreatitis: never.  ?SDOH: She stopped FL continuous glucose monitor, because she lost the reader, and she cannot get a new one.  ?Other: she has been on pump rx (now Omnipod) since 2015/05/26; she uses FL CGM.  ?Interval history: She takes these settings:  ?basal rate of 0.45 units/hr.   ?mealtime bolus to 1 unit/ 40 grams carbohydrate, except please add 1 unit to your calculated breakfast bolus.   ?correction bolus (which some people call "sensitivity," or "insulin sensitivity ratio," or just "isr") of 1 unit for each 100 by which your glucose exceeds 120.   ?She suspends the pump for approx 1 hour, for exercise.   ?TDD is 25 units (43% basal).  She takes 4.3 boluses/d ? Meter is downloaded today, and the printout is scanned into the record.  cbg varies from 70 up to 400.  It is in general higher as the day goes on, but not necessarily so.   ?She has mild hypoglycemia approx 1/week.  This usually happens PC rather than AC--any meal.   ?Past Medical History:  ?Diagnosis Date  ? Diabetes mellitus ([place])   ? Hyperlipidemia   ? Osteopenia   ? 05-25-2000 T-Score -2  ? Pericardial effusion   ? PONV (postoperative nausea and vomiting)   ? Seasonal allergies   ? ? ?Past Surgical History:  ?Procedure Laterality Date  ? rectal polyps removed    ?  right wrist fx    ? SUBXYPHOID PERICARDIAL WINDOW  02/18/2012  ? Procedure: SUBXYPHOID PERICARDIAL WINDOW;  Surgeon: Ivin Poot, MD;  Location: [facility];  Service: Thoracic;  Laterality: N/A;  TEE  ? ? ?Social History  ? ?Socioeconomic History  ? Marital status: Widowed  ?  Spouse name: died May 26, 2011  ? Number of children: 1  ? Years of education: Not on file  ? Highest education level: Not on file  ?Occupational History  ? Occupation: Optician, dispensing top  ?Tobacco Use  ? Smoking status: Never  ? Smokeless tobacco: Never  ?Substance and Sexual Activity  ? Alcohol use: No  ? Drug use: No  ? Sexual activity: Not on file  ?Other Topics Concern  ? Not on file  ?Social History Narrative  ? Not on file  ? ?Social Determinants of Health  ? ?Financial Resource Strain: Not on file  ?Food Insecurity: Not on file  ?Transportation Needs: Not on file  ?Physical Activity: Not on file  ?Stress: Not on file  ?Social Connections: Not on file  ?Intimate Partner Violence: Not on file  ? ? ?Current Outpatient Medications on File Prior to Visit  ?Medication Sig Dispense Refill  ? ALPRAZolam (XANAX) 0.5 MG tablet Take 0.5 mg by mouth every 6 (six) hours as needed for anxiety.    ? aspirin 81 MG tablet  Take 81 mg by mouth every other day.     ? Calcium Carb-Cholecalciferol 600-800 MG-UNIT TABS Take by mouth.    ? CINNAMON PO Take 1,000 mg by mouth.    ? diazepam (VALIUM) 5 MG tablet Take 5 mg by mouth every 6 (six) hours as needed for anxiety.    ? diphenhydrAMINE (BENADRYL) 25 mg capsule Take 25 mg by mouth every 6 (six) hours as needed.    ? escitalopram (LEXAPRO) 10 MG tablet Take 10 mg by mouth daily.    ? glucose blood (ONETOUCH ULTRA) test strip 1 each by Other route in the morning, at noon, in the evening, and at bedtime. E11.9 400 each 0  ? ibandronate (BONIVA) 150 MG tablet Take 1 tablet (150 mg total) by mouth every 30 (thirty) days. Take in the morning with a full glass of water, on an empty stomach, and do not take anything else  by mouth or lie down for the next 30 min. 3 tablet 3  ? Lancets (ONETOUCH ULTRASOFT) lancets 1 each by Other route 4 (four) times daily. E11.9 400 each 0  ? multivitamin-lutein (OCUVITE-LUTEIN) CAPS capsule Take 1 capsule by mouth daily.    ? NOVOLOG 100 UNIT/ML injection USE WITH INSULIN PUMP 40   UNITS DAILY 40 mL 2  ? pravastatin (PRAVACHOL) 40 MG tablet Take 40 mg by mouth daily.    ? sertraline (ZOLOFT) 100 MG tablet Take 50 mg by mouth daily.     ? TURMERIC PO Take by mouth.    ? vitamin C (ASCORBIC ACID) 500 MG tablet Take 500 mg by mouth daily.    ? ?Current Facility-Administered Medications on File Prior to Visit  ?Medication Dose Route Frequency Provider Last Rate Last Admin  ? denosumab (PROLIA) injection 60 mg  60 mg Subcutaneous Once Renato Shin, MD      ? ? ?Allergies  ?Allergen Reactions  ? Morphine And Related   ? Januvia [Sitagliptin]   ? ? ?Family History  ?Problem Relation Age of Onset  ? Heart attack Father   ? Diabetes Brother   ? Heart disease Brother   ? Cancer Sister   ?     ovarian  ? ? ?BP 130/72   Ht '5\' 4"'$  (1.626 m)   Wt 111 lb (50.3 kg)   BMI 19.05 kg/m?  ? ?Review of Systems ? ?   ?Objective:  ? Physical Exam ? ? ?Lab Results  ?Component Value Date  ? HGBA1C 8.0 (A) 06/11/2021  ? ? ?   ?Assessment & Plan:  ?Type 1 DM: uncontrolled.   ?Osteoporosis: check labs today.   ? ?Patient Instructions  ?Please take these settings: ?basal rate of 0.45 units/hr.   ?mealtime bolus to 1 unit/ 30 grams carbohydrate.  ?correction bolus (which some people call "sensitivity," or "insulin sensitivity ratio," or just "isr") of 1 unit for each 100 by which your glucose exceeds 150.   ?Please suspend the pump for approx 1 hour, for exercise.  ?check your blood sugar 4 times a day: before the 3 meals, and at bedtime.  also check if you have symptoms of your blood sugar being too high or too low.  please keep a record of the readings and bring it to your next appointment here (or you can bring the meter  itself).  You can write it on any piece of paper.  please call us sooner if your blood sugar goes below 70, or if you have a lot of readings  over 200.   ?Take calcium 1200 mg per day, and vitamin-D, 400 units per day.   ?Blood tests are requested for you today.  We'll let you know about the results.    ?Please see [name], about the Omnipod 5 and continuous glucose monitor.   ?You should have a follow-up appointment in 3 months.   ? ? ? ? ?

## 2021-06-23 ENCOUNTER — Encounter: Payer: Medicare HMO | Attending: Endocrinology | Admitting: Nutrition

## 2021-06-23 DIAGNOSIS — E109 Type 1 diabetes mellitus without complications: Secondary | ICD-10-CM

## 2021-06-23 DIAGNOSIS — E1065 Type 1 diabetes mellitus with hyperglycemia: Secondary | ICD-10-CM | POA: Diagnosis not present

## 2021-06-23 DIAGNOSIS — Z713 Dietary counseling and surveillance: Secondary | ICD-10-CM | POA: Diagnosis not present

## 2021-06-23 NOTE — Patient Instructions (Signed)
Have granddaughter check you phone to see if you can download the [facility] 3 app.  If possible, use the sensor given to start this like your Young 2 sensor and let us know to order this. ?Your prescription will be called in for the OmniPod 5 to your drug store.  Let us know if you want this pump/can afford this pump. ?

## 2021-06-23 NOTE — Progress Notes (Signed)
Patient is here today because she received a letter from [facility], saying that her pump is being retired, and she will need to get either the Wade system or OmniPod 5.  She is interested in the OmniPod 5. ?We discussed the difference between the 2 pumps and the need to wear the Dexcom sensors, for the OmniPod 5 pump to work.  She is not wearing a Libre sensor because she lot it, and Medicare would not pay for another one.   ?She has agreed to have the OmniPod 5 pump ordered to her pharmacy, and she will see what the cost of this will be, before deciding which pump she will use.   ?Note to Dr. Loanne Drilling to order the OmniPod 5 pump for her.   ?She was given a [employer] 3 sensor, but she did not bring her phone to download the app.  She says that her granddaughter will check her phone to see if it is able to download the 3 app, and she was shown how to start the sensor.   ?

## 2021-06-24 ENCOUNTER — Other Ambulatory Visit: Payer: Self-pay | Admitting: Endocrinology

## 2021-06-24 MED ORDER — OMNIPOD 5 DEXG7G6 INTRO GEN 5 KIT: 1.0000 | PACK | Freq: Once | 0 refills | Status: AC

## 2021-06-24 MED ORDER — OMNIPOD 5 DEXG7G6 PODS GEN 5 MISC
1.0000 | 3 refills | Status: DC
Start: 2021-06-24 — End: 2021-07-16

## 2021-06-24 NOTE — Telephone Encounter (Signed)
Last Prolia inj 06/11/21 ?Next Prolia inj due 12/12/21 ?

## 2021-07-10 DIAGNOSIS — E1165 Type 2 diabetes mellitus with hyperglycemia: Secondary | ICD-10-CM | POA: Diagnosis not present

## 2021-07-13 ENCOUNTER — Other Ambulatory Visit (HOSPITAL_COMMUNITY): Payer: Self-pay

## 2021-07-13 ENCOUNTER — Telehealth: Payer: Self-pay | Admitting: Pharmacy Technician

## 2021-07-13 NOTE — Telephone Encounter (Signed)
Patient Advocate Encounter ? ?Received notification from [employer] that prior authorization for OMNIPOD 5 G6 KIT AND PODS are required. ?  ?PA submitted on 5.8.23 ?Submitted via phone  ?Case #[id number] ? ?APPROVED 5.8.23 TO 12.31.23 ?  ? ? ?Oveta Idris R Maygan Koeller, CPhT ?Patient Advocate ?[facility] ?Phone: [phone] ?Fax:  [phone] ? ?

## 2021-07-16 ENCOUNTER — Other Ambulatory Visit: Payer: Self-pay

## 2021-07-16 DIAGNOSIS — E1065 Type 1 diabetes mellitus with hyperglycemia: Secondary | ICD-10-CM

## 2021-07-16 MED ORDER — OMNIPOD 5 DEXG7G6 PODS GEN 5 MISC: 1.0000 | 3 refills | Status: DC

## 2021-07-16 NOTE — Telephone Encounter (Signed)
RX for Omnipod 5 G6 kit now sent to preferred pharmacy. ?

## 2021-08-10 DIAGNOSIS — E1165 Type 2 diabetes mellitus with hyperglycemia: Secondary | ICD-10-CM | POA: Diagnosis not present

## 2021-08-12 DIAGNOSIS — M79676 Pain in unspecified toe(s): Secondary | ICD-10-CM | POA: Diagnosis not present

## 2021-08-12 DIAGNOSIS — B351 Tinea unguium: Secondary | ICD-10-CM | POA: Diagnosis not present

## 2021-08-12 DIAGNOSIS — E1142 Type 2 diabetes mellitus with diabetic polyneuropathy: Secondary | ICD-10-CM | POA: Diagnosis not present

## 2021-08-12 DIAGNOSIS — L84 Corns and callosities: Secondary | ICD-10-CM | POA: Diagnosis not present

## 2021-08-13 DIAGNOSIS — B351 Tinea unguium: Secondary | ICD-10-CM | POA: Diagnosis not present

## 2021-09-01 DIAGNOSIS — E1165 Type 2 diabetes mellitus with hyperglycemia: Secondary | ICD-10-CM | POA: Diagnosis not present

## 2021-09-16 ENCOUNTER — Telehealth: Payer: Self-pay

## 2021-09-16 NOTE — Telephone Encounter (Signed)
Attempted to contact the patient in regards to rescheduling with another provider, LVM for a call back. 

## 2021-09-24 DIAGNOSIS — B351 Tinea unguium: Secondary | ICD-10-CM | POA: Diagnosis not present

## 2021-10-01 DIAGNOSIS — E1165 Type 2 diabetes mellitus with hyperglycemia: Secondary | ICD-10-CM | POA: Diagnosis not present

## 2021-10-21 ENCOUNTER — Encounter: Payer: Self-pay | Admitting: Internal Medicine

## 2021-10-21 ENCOUNTER — Ambulatory Visit: Payer: Medicare HMO | Admitting: Internal Medicine

## 2021-10-21 VITALS — BP 130/72 | HR 72 | Ht 64.0 in | Wt 113.0 lb

## 2021-10-21 DIAGNOSIS — M810 Age-related osteoporosis without current pathological fracture: Secondary | ICD-10-CM

## 2021-10-21 DIAGNOSIS — I10 Essential (primary) hypertension: Secondary | ICD-10-CM | POA: Diagnosis not present

## 2021-10-21 DIAGNOSIS — E782 Mixed hyperlipidemia: Secondary | ICD-10-CM | POA: Diagnosis not present

## 2021-10-21 DIAGNOSIS — E109 Type 1 diabetes mellitus without complications: Secondary | ICD-10-CM | POA: Diagnosis not present

## 2021-10-21 DIAGNOSIS — E1169 Type 2 diabetes mellitus with other specified complication: Secondary | ICD-10-CM | POA: Diagnosis not present

## 2021-10-21 LAB — POCT GLYCOSYLATED HEMOGLOBIN (HGB A1C): Hemoglobin A1C: 6.3 % — AB (ref 4.0–5.6)

## 2021-10-21 MED ORDER — DENOSUMAB 60 MG/ML ~~LOC~~ SOSY
60.0000 mg | PREFILLED_SYRINGE | Freq: Once | SUBCUTANEOUS | Status: AC
  Administered 2021-10-21: 60 mg via SUBCUTANEOUS

## 2021-10-21 NOTE — Progress Notes (Signed)
After obtaining consent, and per orders of Dr. Kelton Pillar, injection of Prolia '60mg'$   given in left arm SQ by Gerilyn Stargell L Eimi Viney. Patient instructed to remain in clinic for 20 minutes afterwards, and to report any adverse reaction to me immediately.

## 2021-10-21 NOTE — Patient Instructions (Signed)
Increase calcium citrate 600 to 1 tablet twice daily  STOP Ibandronate     HOW TO TREAT LOW BLOOD SUGARS (Blood sugar LESS THAN 70 MG/DL) Please follow the RULE OF 15 for the treatment of hypoglycemia treatment (when your (blood sugars are less than 70 mg/dL)   STEP 1: Take 15 grams of carbohydrates when your blood sugar is low, which includes:  3-4 GLUCOSE TABS  OR 3-4 OZ OF JUICE OR REGULAR SODA OR ONE TUBE OF GLUCOSE GEL    STEP 2: RECHECK blood sugar in 15 MINUTES STEP 3: If your blood sugar is still low at the 15 minute recheck --> then, go back to STEP 1 and treat AGAIN with another 15 grams of carbohydrates.

## 2021-10-21 NOTE — Progress Notes (Signed)
Name: Audrey [name]  Age/ Sex: 76 y.o., female   MRN/ DOB: [id number], 1945/06/09     PCP: Mayra Neer, MD   Reason for Endocrinology Evaluation: Type 1 Diabetes Mellitus/Osteoporosis   Initial Endocrine Consultative Visit: 11/15/2013    PATIENT IDENTIFIER: Audrey [name] is a 76 y.o. female with a past medical history of T1DM and osteoporosis . The patient has followed with Endocrinology clinic since 11/15/2013 for consultative assistance with management of her diabetes.  DIABETIC HISTORY:  Audrey [name] was diagnosed with DM 2007, per chart review the patient was on oral glycemic agents but was started on insulin in 2011.  Audrey [name] was started on insulin pump in 2017 (initially Animas Vibe) . Her hemoglobin A1c has ranged from 5.9% in 2018, peaking at 8.5% in 2016.  OSTEOPOROSIS HISTORY: Audrey [name] was diagnosed with osteoporosis in 2008.  Audrey [name] was on alendronate 2000 10-2011.  Audrey [name] has history of wrist fracture in 2010 Audrey [name] was started on Prolia on 09/13/2018    SUBJECTIVE:   During the last visit (/08/2021): Saw Dr. Loanne Drilling  Today (10/21/2021): Audrey [name] is here for follow-up on diabetes management and osteoporosis.  Audrey [name] checks her blood sugars multiple times daily, through CGM. The patient has not had hypoglycemic episodes since the last clinic visit  This patient with type 1 diabetes is treated with OmniPod (insulin pump). During the visit the pump basal and bolus doses were reviewed including carb/insulin rations and supplemental doses. The clinical list was updated. The glucose meter download was reviewed in detail to determine if the current pump settings are providing the best glycemic control without excessive hypoglycemia.  Denies nausea, vomiting or diarrhea  No recent falls or fractures   Pump and meter download:    Pump   OmniPod Settings   Insulin type   NovoLog   Basal rate       0000 0.45 u/h               I:C ratio       0000 40 gram                    Sensitivity       0000  100      Goal       0000  120            Type & Model of Pump: OmniPod Insulin Type: Currently using NovoLog.    PUMP STATISTICS: Average BG: 137  Average Daily Carbs (g): 425.7  Average Total Daily Insulin: 21.4  Average Daily Basal: 10.8 (50 %) Average Daily Bolus: 10.6 (50 %)        HOME DIABETES REGIMEN:  Novolog  Calcium citrate/Vitamin D  600 mg daily  Ibandronate 150 mg monthly   Statin: yes ACE-I/ARB: no     DIABETIC COMPLICATIONS: Microvascular complications:   Denies:retinopathy, CKD , neuropathy  Last Eye Exam: Completed 2023  Macrovascular complications:   Denies: CAD, CVA, PVD   HISTORY:  Past Medical History:  Past Medical History:  Diagnosis Date   Diabetes mellitus ([place])    Hyperlipidemia    Osteopenia    2002 T-Score -2   Pericardial effusion    PONV (postoperative nausea and vomiting)    Seasonal allergies    Past Surgical History:  Past Surgical History:  Procedure Laterality Date   rectal polyps removed     right wrist fx     SUBXYPHOID PERICARDIAL WINDOW  02/18/2012   Procedure: SUBXYPHOID PERICARDIAL  WINDOW;  Surgeon: Ivin Poot, MD;  Location: [facility] OR;  Service: Thoracic;  Laterality: N/A;  TEE   Social History:  reports that Audrey [name] has never smoked. Audrey [name] has never used smokeless tobacco. Audrey [name] reports that Audrey [name] does not drink alcohol and does not use drugs. Family History:  Family History  Problem Relation Age of Onset   Heart attack Father    Diabetes Brother    Heart disease Brother    Cancer Sister        ovarian     HOME MEDICATIONS: Allergies as of 10/21/2021       Reactions   Morphine And Related    Januvia [sitagliptin]         Medication List        Accurate as of October 21, 2021  8:15 AM. If you have any questions, ask your nurse or doctor.          STOP taking these medications    escitalopram 10 MG tablet Commonly known as: LEXAPRO Stopped by: Dorita Sciara, MD       TAKE these medications    ALPRAZolam 0.5 MG tablet Commonly known as: XANAX Take 0.5 mg by mouth every 6 (six) hours as needed for anxiety.   ascorbic acid 500 MG tablet Commonly known as: VITAMIN C Take 500 mg by mouth daily.   aspirin 81 MG tablet Take 81 mg by mouth every other day.   Calcium Carb-Cholecalciferol 600-800 MG-UNIT Tabs Take by mouth.   CINNAMON PO Take 1,000 mg by mouth.   diazepam 5 MG tablet Commonly known as: VALIUM Take 5 mg by mouth every 6 (six) hours as needed for anxiety.   diphenhydrAMINE 25 mg capsule Commonly known as: BENADRYL Take 25 mg by mouth every 6 (six) hours as needed.   ibandronate 150 MG tablet Commonly known as: BONIVA Take 1 tablet (150 mg total) by mouth every 30 (thirty) days. Take in the morning with a full glass of water, on an empty stomach, and do not take anything else by mouth or lie down for the next 30 min.   multivitamin-lutein Caps capsule Take 1 capsule by mouth daily.   NovoLOG 100 UNIT/ML injection Generic drug: insulin aspart USE WITH INSULIN PUMP 40   UNITS DAILY   Omnipod 5 G6 Pod (Gen 5) Misc 1 Device by Does not apply route every 3 (three) days.   OneTouch Ultra test strip Generic drug: glucose blood 1 each by Other route in the morning, at noon, in the evening, and at bedtime. E11.9   onetouch ultrasoft lancets 1 each by Other route 4 (four) times daily. E11.9   pravastatin 40 MG tablet Commonly known as: PRAVACHOL Take 40 mg by mouth daily.   sertraline 100 MG tablet Commonly known as: ZOLOFT Take 50 mg by mouth daily.   TURMERIC PO Take by mouth.         OBJECTIVE:   Vital Signs: BP 130/72 (BP Location: Left Arm, Patient Position: Sitting, Cuff Size: Small)   Pulse 72   Ht '5\' 4"'$  (1.626 m)   Wt 113 lb (51.3 kg)   SpO2 98%   BMI 19.40 kg/m   Wt Readings from Last 3 Encounters:  10/21/21 113 lb (51.3 kg)  06/11/21 111 lb (50.3 kg)  12/10/20 115 lb 9.6 oz  (52.4 kg)     Exam: General: Pt appears well and is in NAD  Neck: General: Supple without adenopathy. Thyroid: Thyroid size normal.  No  goiter or nodules appreciated.   Lungs: Clear with good BS bilat with no rales, rhonchi, or wheezes  Heart: RRR with normal S1 and S2 and no gallops; no murmurs; no rub  Abdomen: Normoactive bowel sounds, soft, nontender  Extremities: No pretibial edema.   Neuro: MS is good with appropriate affect, pt is alert and Ox3    DM foot exam: 10/21/2021  The skin of the feet is intact without sores or ulcerations, left great toe nail onychomycosis  The pedal pulses are 2+ on right and 2+ on left. The sensation is intact to a screening 5.07, 10 gram monofilament bilaterally   DATA REVIEWED:  Lab Results  Component Value Date   HGBA1C 6.3 (A) 10/21/2021   HGBA1C 8.0 (A) 06/11/2021   HGBA1C 7.2 (A) 12/10/2020   Lab Results  Component Value Date   MICROALBUR 2.1 (H) 02/24/2015   LDLCALC 77 01/16/2014   CREATININE 0.79 09/26/2019   Lab Results  Component Value Date   MICRALBCREAT 2.1 02/24/2015     Lab Results  Component Value Date   CHOL 135 01/16/2014   HDL 44.00 01/16/2014   LDLCALC 77 01/16/2014   TRIG 68.0 01/16/2014   CHOLHDL 3 01/16/2014       DXA 05/28/2020   T score  AP spine -1.5  RFN -2.4  Right total hip -1.7  LFN -2.6  Left total hip -2.1    ASSESSMENT / PLAN / RECOMMENDATIONS:   1) Type 1 Diabetes Mellitus, Optimally controlled, Without complications - Most recent A1c of 6.3 %. Goal A1c < 7.0 %.     - Audrey [name] has not switched to the Omnipod 5 yet,her deductible $250 . Would like to wait unil the new year - Audrey [name] will have labs at PCP's office today  - No changes today    MEDICATIONS: Novolog  EDUCATION / INSTRUCTIONS: BG monitoring instructions: Patient is instructed to check her blood sugars 3 times a day,. Call [facility] if: BG persistently < 70 I reviewed the Rule of 15 for the treatment of  hypoglycemia in detail with the patient. Literature supplied.   2) Diabetic complications:  Eye: Does not have known diabetic retinopathy.  Neuro/ Feet: Does  have known diabetic peripheral neuropathy .  Renal: Patient does not have known baseline CKD. Audrey [name]   is not on an ACEI/ARB at present.    3) Osteoporosis  -Audrey [name] was on alendronate 2000 10-2011 -First Prolia injection received on 09/13/2018 - Audrey [name] continues to be on Ibandronate, will stop    Medication  Increase Calcium Citrate /VitD 600 BID  Continue Prolia 60 mg SQ Q 6 mos STOP Ibandronate     F/U in 6 months     Signed electronically by: Mack Guise, MD  [facility]  [facility] [address] [place], [place] [address] Phone: [phone] FAX: [phone]   CC: Mayra Neer, MD [address] [place] [address] Phone: [phone]  Fax: [phone]  Return to Endocrinology clinic as below: No future appointments.

## 2021-10-23 DIAGNOSIS — E1142 Type 2 diabetes mellitus with diabetic polyneuropathy: Secondary | ICD-10-CM | POA: Diagnosis not present

## 2021-10-23 DIAGNOSIS — B351 Tinea unguium: Secondary | ICD-10-CM | POA: Diagnosis not present

## 2021-10-23 DIAGNOSIS — L84 Corns and callosities: Secondary | ICD-10-CM | POA: Diagnosis not present

## 2021-10-23 DIAGNOSIS — M79676 Pain in unspecified toe(s): Secondary | ICD-10-CM | POA: Diagnosis not present

## 2021-11-01 DIAGNOSIS — E1165 Type 2 diabetes mellitus with hyperglycemia: Secondary | ICD-10-CM | POA: Diagnosis not present

## 2021-11-14 NOTE — Telephone Encounter (Signed)
Prolia VOB initiated via [email or web address] 

## 2021-11-23 DIAGNOSIS — B351 Tinea unguium: Secondary | ICD-10-CM | POA: Diagnosis not present

## 2021-11-25 DIAGNOSIS — E1165 Type 2 diabetes mellitus with hyperglycemia: Secondary | ICD-10-CM | POA: Diagnosis not present

## 2021-11-25 NOTE — Telephone Encounter (Signed)
Pt ready for scheduling on or after 12/12/21   Out-of-pocket cost due at time of visit: $301   Primary: [employer] Medicare Prolia co-insurance: 20% (approximately $276) Admin fee co-insurance: 20%   Secondary: n/a Prolia co-insurance:  Admin fee co-insurance:    Deductible: does not apply   Prior Auth: APPROVED PA# [id number] Valid: 05/19/21-05/19/22   ** This summary of benefits is an estimation of the patient's out-of-pocket cost. Exact cost may vary based on individual plan coverage.

## 2021-12-23 DIAGNOSIS — Z1231 Encounter for screening mammogram for malignant neoplasm of breast: Secondary | ICD-10-CM | POA: Diagnosis not present

## 2021-12-25 ENCOUNTER — Other Ambulatory Visit: Payer: Self-pay

## 2021-12-25 DIAGNOSIS — E1065 Type 1 diabetes mellitus with hyperglycemia: Secondary | ICD-10-CM

## 2021-12-25 DIAGNOSIS — E1165 Type 2 diabetes mellitus with hyperglycemia: Secondary | ICD-10-CM | POA: Diagnosis not present

## 2021-12-25 MED ORDER — INSULIN ASPART 100 UNIT/ML IJ SOLN: INTRAMUSCULAR | 2 refills | Status: DC

## 2022-01-01 DIAGNOSIS — M79676 Pain in unspecified toe(s): Secondary | ICD-10-CM | POA: Diagnosis not present

## 2022-01-01 DIAGNOSIS — L84 Corns and callosities: Secondary | ICD-10-CM | POA: Diagnosis not present

## 2022-01-01 DIAGNOSIS — E1142 Type 2 diabetes mellitus with diabetic polyneuropathy: Secondary | ICD-10-CM | POA: Diagnosis not present

## 2022-01-01 DIAGNOSIS — B351 Tinea unguium: Secondary | ICD-10-CM | POA: Diagnosis not present

## 2022-01-11 ENCOUNTER — Telehealth: Payer: Self-pay

## 2022-01-11 NOTE — Telephone Encounter (Signed)
Patient advised that the Ibandronate was stopped at last office visit on August.

## 2022-01-12 ENCOUNTER — Telehealth: Payer: Self-pay

## 2022-01-12 NOTE — Telephone Encounter (Signed)
Spoke with [facility] and advised that the Jaclyn Prime was stopped at last office visit in August.

## 2022-01-18 DIAGNOSIS — E119 Type 2 diabetes mellitus without complications: Secondary | ICD-10-CM | POA: Diagnosis not present

## 2022-01-19 ENCOUNTER — Other Ambulatory Visit (HOSPITAL_COMMUNITY): Payer: Self-pay

## 2022-01-19 ENCOUNTER — Telehealth: Payer: Self-pay | Admitting: Pharmacy Technician

## 2022-01-19 NOTE — Telephone Encounter (Signed)
Pharmacy Patient Advocate Encounter  Received a fax from [facility] requesting more information in regard to PA request for Omnipod 5. However, per test claim, the pt has a PA on file until the end of the year.  Per Test Claim: Copay for a 90 day supply is $720.86  It appears she's in her donut hole.

## 2022-01-23 DIAGNOSIS — Z791 Long term (current) use of non-steroidal anti-inflammatories (NSAID): Secondary | ICD-10-CM | POA: Diagnosis not present

## 2022-01-23 DIAGNOSIS — Z833 Family history of diabetes mellitus: Secondary | ICD-10-CM | POA: Diagnosis not present

## 2022-01-23 DIAGNOSIS — F325 Major depressive disorder, single episode, in full remission: Secondary | ICD-10-CM | POA: Diagnosis not present

## 2022-01-23 DIAGNOSIS — Z809 Family history of malignant neoplasm, unspecified: Secondary | ICD-10-CM | POA: Diagnosis not present

## 2022-01-23 DIAGNOSIS — Z7982 Long term (current) use of aspirin: Secondary | ICD-10-CM | POA: Diagnosis not present

## 2022-01-23 DIAGNOSIS — M810 Age-related osteoporosis without current pathological fracture: Secondary | ICD-10-CM | POA: Diagnosis not present

## 2022-01-23 DIAGNOSIS — R69 Illness, unspecified: Secondary | ICD-10-CM | POA: Diagnosis not present

## 2022-01-23 DIAGNOSIS — I10 Essential (primary) hypertension: Secondary | ICD-10-CM | POA: Diagnosis not present

## 2022-01-23 DIAGNOSIS — E119 Type 2 diabetes mellitus without complications: Secondary | ICD-10-CM | POA: Diagnosis not present

## 2022-01-23 DIAGNOSIS — M1990 Unspecified osteoarthritis, unspecified site: Secondary | ICD-10-CM | POA: Diagnosis not present

## 2022-01-23 DIAGNOSIS — Z79620 Long term (current) use of immunosuppressive biologic: Secondary | ICD-10-CM | POA: Diagnosis not present

## 2022-01-23 DIAGNOSIS — Z794 Long term (current) use of insulin: Secondary | ICD-10-CM | POA: Diagnosis not present

## 2022-01-23 DIAGNOSIS — Z8249 Family history of ischemic heart disease and other diseases of the circulatory system: Secondary | ICD-10-CM | POA: Diagnosis not present

## 2022-01-25 DIAGNOSIS — E1165 Type 2 diabetes mellitus with hyperglycemia: Secondary | ICD-10-CM | POA: Diagnosis not present

## 2022-01-29 NOTE — Telephone Encounter (Signed)
Hi Dr. Kelton Pillar,  It looks like pt received Prolia inj 06/11/21 and 10/21/21. If pt received Prolia inj 06/11/21, she was not due for next inj until 12/12/21.   Please confirm that 10/21/21 was the last Prolia inj.

## 2022-02-06 NOTE — Telephone Encounter (Signed)
Last Prolia inj 10/21/21 Next Prolia inj due 04/24/22

## 2022-02-08 DIAGNOSIS — B338 Other specified viral diseases: Secondary | ICD-10-CM | POA: Diagnosis not present

## 2022-02-17 DIAGNOSIS — E119 Type 2 diabetes mellitus without complications: Secondary | ICD-10-CM | POA: Diagnosis not present

## 2022-02-19 DIAGNOSIS — E1165 Type 2 diabetes mellitus with hyperglycemia: Secondary | ICD-10-CM | POA: Diagnosis not present

## 2022-03-12 DIAGNOSIS — M79676 Pain in unspecified toe(s): Secondary | ICD-10-CM | POA: Diagnosis not present

## 2022-03-12 DIAGNOSIS — E1142 Type 2 diabetes mellitus with diabetic polyneuropathy: Secondary | ICD-10-CM | POA: Diagnosis not present

## 2022-03-12 DIAGNOSIS — L84 Corns and callosities: Secondary | ICD-10-CM | POA: Diagnosis not present

## 2022-03-12 DIAGNOSIS — B351 Tinea unguium: Secondary | ICD-10-CM | POA: Diagnosis not present

## 2022-03-16 NOTE — Telephone Encounter (Signed)
Prolia VOB initiated via [email or web address]  Last OV: 10/21/21 Next OV:  Last Prolia inj 10/21/21 Next Prolia inj due 04/24/22

## 2022-03-23 DIAGNOSIS — E119 Type 2 diabetes mellitus without complications: Secondary | ICD-10-CM | POA: Diagnosis not present

## 2022-03-31 NOTE — Telephone Encounter (Signed)
Pt ready for scheduling on or after 04/24/22  Out-of-pocket cost due at time of visit: $327  Primary: [employer] Medicare PPO Prolia co-insurance: 20% (approximately $302) Admin fee co-insurance: 20% (approximately $25)  Deductible: does not apply  Secondary: n/a Prolia co-insurance:  Admin fee co-insurance:   Deductible:   Prior Auth: APPROVED PA# [id number] Valid: 05/19/21-05/19/22  ** This summary of benefits is an estimation of the patient's out-of-pocket cost. Exact cost may vary based on individual plan coverage.

## 2022-04-01 NOTE — Telephone Encounter (Signed)
Patient is scheduled for 04/28/2022 for Prolia injection and office visit wiith Dr. Kelton Pillar.

## 2022-04-19 ENCOUNTER — Ambulatory Visit: Payer: Medicare HMO | Admitting: Internal Medicine

## 2022-04-26 ENCOUNTER — Telehealth: Payer: Self-pay

## 2022-04-26 DIAGNOSIS — E109 Type 1 diabetes mellitus without complications: Secondary | ICD-10-CM

## 2022-04-26 NOTE — Addendum Note (Signed)
Addended by: Dorita Sciara on: 04/26/2022 11:39 AM   Modules accepted: Orders

## 2022-04-26 NOTE — Telephone Encounter (Signed)
Patient states that she has been approved to pick up her Omnipods and would like to know if she needs to bring them to her upcoming appt since she will be starting on the system

## 2022-04-27 DIAGNOSIS — E119 Type 2 diabetes mellitus without complications: Secondary | ICD-10-CM | POA: Diagnosis not present

## 2022-04-27 NOTE — Telephone Encounter (Signed)
Patient is coming in tomorrow and wanting to talk to me about the Omnipod 5.  We discuss the differences between the [facility] and the new pump.  She was told that [facility] will check her phone to see if it can download the Dexcom G6 mobil app, and then we can order her the sensors and OmniPod 5 pump

## 2022-04-27 NOTE — Telephone Encounter (Signed)
I am off on Wednesday this week.  I will call her.

## 2022-04-28 ENCOUNTER — Encounter: Payer: Self-pay | Admitting: Internal Medicine

## 2022-04-28 ENCOUNTER — Ambulatory Visit: Payer: Medicare HMO | Admitting: Internal Medicine

## 2022-04-28 VITALS — BP 126/70 | HR 90 | Ht 64.0 in | Wt 110.2 lb

## 2022-04-28 DIAGNOSIS — M47816 Spondylosis without myelopathy or radiculopathy, lumbar region: Secondary | ICD-10-CM | POA: Diagnosis not present

## 2022-04-28 DIAGNOSIS — M810 Age-related osteoporosis without current pathological fracture: Secondary | ICD-10-CM | POA: Diagnosis not present

## 2022-04-28 DIAGNOSIS — E109 Type 1 diabetes mellitus without complications: Secondary | ICD-10-CM

## 2022-04-28 DIAGNOSIS — R69 Illness, unspecified: Secondary | ICD-10-CM | POA: Diagnosis not present

## 2022-04-28 DIAGNOSIS — E782 Mixed hyperlipidemia: Secondary | ICD-10-CM | POA: Diagnosis not present

## 2022-04-28 DIAGNOSIS — F3342 Major depressive disorder, recurrent, in full remission: Secondary | ICD-10-CM | POA: Diagnosis not present

## 2022-04-28 DIAGNOSIS — I10 Essential (primary) hypertension: Secondary | ICD-10-CM | POA: Diagnosis not present

## 2022-04-28 DIAGNOSIS — L409 Psoriasis, unspecified: Secondary | ICD-10-CM | POA: Diagnosis not present

## 2022-04-28 DIAGNOSIS — E1065 Type 1 diabetes mellitus with hyperglycemia: Secondary | ICD-10-CM

## 2022-04-28 DIAGNOSIS — Z0000 Encounter for general adult medical examination without abnormal findings: Secondary | ICD-10-CM | POA: Diagnosis not present

## 2022-04-28 DIAGNOSIS — I2510 Atherosclerotic heart disease of native coronary artery without angina pectoris: Secondary | ICD-10-CM | POA: Diagnosis not present

## 2022-04-28 LAB — POCT GLYCOSYLATED HEMOGLOBIN (HGB A1C): Hemoglobin A1C: 6.4 % — AB (ref 4.0–5.6)

## 2022-04-28 MED ORDER — DEXCOM G6 TRANSMITTER MISC: 1.0000 | 3 refills | Status: DC

## 2022-04-28 MED ORDER — OMNIPOD 5 DEXG7G6 PODS GEN 5 MISC: 1.0000 | 3 refills | Status: DC

## 2022-04-28 MED ORDER — DENOSUMAB 60 MG/ML ~~LOC~~ SOSY
60.0000 mg | PREFILLED_SYRINGE | Freq: Once | SUBCUTANEOUS | Status: AC
  Administered 2022-04-28: 60 mg via SUBCUTANEOUS

## 2022-04-28 MED ORDER — OMNIPOD 5 DEXG7G6 INTRO GEN 5 KIT: 1.0000 | PACK | 0 refills | Status: DC

## 2022-04-28 MED ORDER — DEXCOM G6 SENSOR MISC: 1.0000 | 3 refills | Status: DC

## 2022-04-28 NOTE — Progress Notes (Unsigned)
Name: Audrey [name]  Age/ Sex: 77 y.o., female   MRN/ DOB: [id number], [birth year]     PCP: Mayra Neer, MD   Reason for Endocrinology Evaluation: Type 1 Diabetes Mellitus/Osteoporosis   Initial Endocrine Consultative Visit: 11/15/2013    PATIENT IDENTIFIER: Audrey [name] is a 77 y.o. female with a past medical history of T1DM and osteoporosis . The patient has followed with Endocrinology clinic since 11/15/2013 for consultative assistance with management of her diabetes.  DIABETIC HISTORY:  Audrey [name] was diagnosed with DM 2007, per chart review the patient was on oral glycemic agents but was started on insulin in 2011.  She was started on insulin pump in 2017 (initially Animas Vibe) . Her hemoglobin A1c has ranged from 5.9% in 2018, peaking at 8.5% in 2016.  OSTEOPOROSIS HISTORY: She was diagnosed with osteoporosis in 2008.  She was on alendronate 2008-2013.  She has history of wrist fracture in 2010 She was started on Prolia on 09/13/2018 in conjunction with Ibandronate, which I stopped in 10/2021 while continuing on Prolia   She was followed by Audrey [name]  from 2015 until 06/2021  SUBJECTIVE:   During the last visit (10/21/2021): A1c 6.3%     Today (04/28/2022): Audrey [name] is here for follow-up on diabetes management and osteoporosis.  She checks her blood sugars multiple times daily. The patient has not had hypoglycemic episodes since the last clinic visit  This patient with type 1 diabetes is treated with OmniPod (insulin pump). During the visit the pump basal and bolus doses were reviewed including carb/insulin rations and supplemental doses. The clinical list was updated. The glucose meter download was reviewed in detail to determine if the current pump settings are providing the best glycemic control without excessive hypoglycemia.   She has finally received Omnipod5 and was referred to our CDE  Denies nausea, vomiting or diarrhea  She has a recent fall while walking on  a treadmill  No fractures  Last prolia  10/21/2021  Pump and meter download:    Pump   OmniPod Settings   Insulin type   NovoLog   Basal rate       0000 0.45 u/h               I:C ratio       0000 40 gram                   Sensitivity       0000  100      Goal       0000  120            Type & Model of Pump: OmniPod Insulin Type: Currently using NovoLog.    PUMP STATISTICS: Average BG: 152   Average Daily Carbs (g): 432.7 Average Total Daily Insulin: 19.7 Average Daily Basal: 9.6(49 %) Average Daily Bolus: 10.1 (51 %)        HOME DIABETES REGIMEN:  Novolog  Calcium citrate/Vitamin D  600 mg daily   Statin: yes ACE-I/ARB: no     DIABETIC COMPLICATIONS: Microvascular complications:   Denies:retinopathy, CKD , neuropathy  Last Eye Exam: Completed 2023  Macrovascular complications:   Denies: CAD, CVA, PVD   HISTORY:  Past Medical History:  Past Medical History:  Diagnosis Date   Diabetes mellitus ([place])    Hyperlipidemia    Osteopenia    2002 T-Score -2   Pericardial effusion    PONV (postoperative nausea and vomiting)    Seasonal allergies  Past Surgical History:  Past Surgical History:  Procedure Laterality Date   rectal polyps removed     right wrist fx     SUBXYPHOID PERICARDIAL WINDOW  02/18/2012   Procedure: SUBXYPHOID PERICARDIAL WINDOW;  Surgeon: Ivin Poot, MD;  Location: [facility];  Service: Thoracic;  Laterality: N/A;  TEE   Social History:  reports that she has never smoked. She has never used smokeless tobacco. She reports that she does not drink alcohol and does not use drugs. Family History:  Family History  Problem Relation Age of Onset   Heart attack Father    Diabetes Brother    Heart disease Brother    Cancer Sister        ovarian     HOME MEDICATIONS: Allergies as of 04/28/2022       Reactions   Morphine And Related    Januvia [sitagliptin]         Medication List        Accurate as  of April 28, 2022  1:25 PM. If you have any questions, ask your nurse or doctor.          STOP taking these medications    sertraline 100 MG tablet Commonly known as: ZOLOFT Stopped by: Dorita Sciara, MD       TAKE these medications    ALPRAZolam 0.5 MG tablet Commonly known as: XANAX Take 0.5 mg by mouth every 6 (six) hours as needed for anxiety.   ascorbic acid 500 MG tablet Commonly known as: VITAMIN C Take 500 mg by mouth daily.   aspirin 81 MG tablet Take 81 mg by mouth every other day.   Calcium Carb-Cholecalciferol 600-800 MG-UNIT Tabs Take by mouth.   CINNAMON PO Take 1,000 mg by mouth.   diazepam 5 MG tablet Commonly known as: VALIUM Take 5 mg by mouth every 6 (six) hours as needed for anxiety.   diphenhydrAMINE 25 mg capsule Commonly known as: BENADRYL Take 25 mg by mouth every 6 (six) hours as needed.   insulin aspart 100 UNIT/ML injection Commonly known as: NovoLOG USE WITH INSULIN PUMP 40   UNITS DAILY   multivitamin-lutein Caps capsule Take 1 capsule by mouth daily.   Omnipod 5 G6 Pods (Gen 5) Misc 1 Device by Does not apply route every 3 (three) days.   OneTouch Ultra test strip Generic drug: glucose blood 1 each by Other route in the morning, at noon, in the evening, and at bedtime. E11.9   onetouch ultrasoft lancets 1 each by Other route 4 (four) times daily. E11.9   pravastatin 40 MG tablet Commonly known as: PRAVACHOL Take 80 mg by mouth daily.   TURMERIC PO Take by mouth.         OBJECTIVE:   Vital Signs: BP 126/70 (BP Location: Left Arm, Patient Position: Sitting, Cuff Size: Small)   Pulse 90   Ht 5' 4"$  (1.626 m)   Wt 110 lb 3.2 oz (50 kg)   SpO2 99%   BMI 18.92 kg/m   Wt Readings from Last 3 Encounters:  04/28/22 110 lb 3.2 oz (50 kg)  10/21/21 113 lb (51.3 kg)  06/11/21 111 lb (50.3 kg)     Exam: General: Pt appears well and is in NAD  Neck: General: Supple without adenopathy. Thyroid: Thyroid  size normal.  No goiter or nodules appreciated.   Lungs: Clear with good BS bilat   Heart: RRR   Abdomen: Normoactive bowel sounds, soft, nontender  Extremities: No pretibial  edema.   Neuro: MS is good with appropriate affect, pt is alert and Ox3    DM foot exam: 10/21/2021  The skin of the feet is intact without sores or ulcerations, left great toe nail onychomycosis  The pedal pulses are 2+ on right and 2+ on left. The sensation is intact to a screening 5.07, 10 gram monofilament bilaterally   DATA REVIEWED:  Lab Results  Component Value Date   HGBA1C 6.3 (A) 10/21/2021   HGBA1C 8.0 (A) 06/11/2021   HGBA1C 7.2 (A) 12/10/2020   Lab Results  Component Value Date   MICROALBUR 2.1 (H) 02/24/2015   LDLCALC 77 01/16/2014   CREATININE 0.79 09/26/2019   Lab Results  Component Value Date   MICRALBCREAT 2.1 02/24/2015     Lab Results  Component Value Date   CHOL 135 01/16/2014   HDL 44.00 01/16/2014   LDLCALC 77 01/16/2014   TRIG 68.0 01/16/2014   CHOLHDL 3 01/16/2014       DXA 05/28/2020   T score  AP spine -1.5  RFN -2.4  Right total hip -1.7  LFN -2.6  Left total hip -2.1    ASSESSMENT / PLAN / RECOMMENDATIONS:   1) Type 1 Diabetes Mellitus, Optimally controlled, Without complications - Most recent A1c of 6.4 %. Goal A1c < 7.0 %.     -A1c remains optimal -She received a letter from her insurance stating that she has been approved for the OmniPod, a prescription for OmniPod has been sent to her pharmacy, a prescription for Dexcom will be faxed to her DME supplier -She has already contacted our CDE and planning for future training appointment -No changes to the pump settings at this time   MEDICATIONS: Novolog  EDUCATION / INSTRUCTIONS: BG monitoring instructions: Patient is instructed to check her blood sugars 3 times a day,. Call [facility] if: BG persistently < 70 I reviewed the Rule of 15 for the treatment of hypoglycemia in detail  with the patient. Literature supplied.   2) Diabetic complications:  Eye: Does not have known diabetic retinopathy.  Neuro/ Feet: Does  have known diabetic peripheral neuropathy .  Renal: Patient does not have known baseline CKD. She   is not on an ACEI/ARB at present.    3) Osteoporosis  -She was on alendronate 2008-2013 -She was started on Prolia and ibandronate at the same time in 2020 -First Prolia injection received on 09/13/2018 -I stopped ibandronate in 10/2021 but continued Prolia -She received her injection of Prolia today 04/28/2022 -She is due to have her bone density scheduled at [facility], she was provided with contact information  Medication  Continue Calcium Citrate /VitD 600 BID  Continue Prolia 60 mg SQ Q 6 mos  F/U in 6 months     Signed electronically by: Mack Guise, MD  [facility] End[facility]in[facility]  [facility] [address] [place], [place] [address] Phone: [phone] FAX: [phone]   CC: Mayra Neer, MD [address] [place] [address] Phone: [phone]  Fax: [phone]  Return to Endocrinology clinic as below: No future appointments.

## 2022-04-28 NOTE — Patient Instructions (Addendum)
   Please contact [facility] to schedule bone density  Address: [address], [place], [place] [address] Phone: [phone]   HOW TO TREAT LOW BLOOD SUGARS (Blood sugar LESS THAN 70 MG/DL) Please follow the RULE OF 15 for the treatment of hypoglycemia treatment (when your (blood sugars are less than 70 mg/dL)   STEP 1: Take 15 grams of carbohydrates when your blood sugar is low, which includes:  3-4 GLUCOSE TABS  OR 3-4 OZ OF JUICE OR REGULAR SODA OR ONE TUBE OF GLUCOSE GEL    STEP 2: RECHECK blood sugar in 15 MINUTES STEP 3: If your blood sugar is still low at the 15 minute recheck --> then, go back to STEP 1 and treat AGAIN with another 15 grams of carbohydrates.

## 2022-04-28 NOTE — Progress Notes (Unsigned)
After obtaining consent, and per orders of Dr.Shamleffer , injection of Prolia 23m  given by Ravenne Wayment L Jaynia Fendley in left arm SQ. Patient instructed to remain in clinic for 20 minutes afterwards, and to report any adverse reaction to me immediately.

## 2022-05-03 NOTE — Telephone Encounter (Signed)
Last Prolia inj 04/28/22 Next Prolia inj due 10/28/22

## 2022-05-04 ENCOUNTER — Telehealth: Payer: Self-pay

## 2022-05-04 MED ORDER — OMNIPOD CLASSIC PODS (GEN 3) MISC: 1.0000 | 1 refills | Status: DC

## 2022-05-04 NOTE — Telephone Encounter (Signed)
Patient would like to have a script for the old pod sent to [facility] until she is able to see [name]

## 2022-05-04 NOTE — Telephone Encounter (Signed)
Patient pod ran out today and she doesn't have a  insulin pen to give medication until Omnipod is set up.

## 2022-05-04 NOTE — Telephone Encounter (Addendum)
Audrey [name] it looks like you spoke with her before  04/27/22 and would call her once you returned.   She is able to download the [facility] Clarity app but it was not pulling up the G6 app to download.  Patient Pod has ran out today and wants to know if she can see you today. I advise that you would probably not be today.

## 2022-05-05 ENCOUNTER — Other Ambulatory Visit: Payer: Self-pay | Admitting: Internal Medicine

## 2022-05-05 MED ORDER — INSULIN PEN NEEDLE 32G X 4 MM MISC: 3 refills | Status: DC

## 2022-05-05 MED ORDER — LANTUS SOLOSTAR 100 UNIT/ML ~~LOC~~ SOPN: 10.0000 [IU] | PEN_INJECTOR | Freq: Every day | SUBCUTANEOUS | 1 refills | Status: DC

## 2022-05-05 MED ORDER — NOVOLOG FLEXPEN 100 UNIT/ML ~~LOC~~ SOPN: PEN_INJECTOR | SUBCUTANEOUS | 1 refills | Status: DC

## 2022-05-05 NOTE — Telephone Encounter (Signed)
Patient reports that she has run out of OmniPods, and that they are no longer making them. (She is on the original OmniPod pods) She has been to 5 drug stores to see if they have her old pods, but no one does.  She also has no syringes to take Novolog.  She does not know how to take the Novolog using a syringe.  Her brother gave her a pen of Lantus and she has taken 2 injections today of 25u of Lantus, once at [facility], and 25 more units  "several hours ago", she can not remember when she took the 2nd dose. I told her not take any more of that insulin, and we will call her in some Novolog pens and pen needles to use until next week, when I can see her to set up the Omnipod 5 pump she has gotten. Please call in a dose of Lantus for her as well as Novolog pens and needles to use until I can see her next week.  Her blood sugar is reading Hi ('350mg'$ /dl)

## 2022-05-06 NOTE — Telephone Encounter (Signed)
Patient has been advised and will pick up medicaiton

## 2022-05-10 ENCOUNTER — Telehealth: Payer: Self-pay | Admitting: Nutrition

## 2022-05-10 NOTE — Telephone Encounter (Signed)
Patient reports that starter kit for this was over $300, and the pods were $360.00 for a 3 month supply, so she did not pick this up. I called the pharmacy,and they told me the starter kit was $100 dollars but the pods were as the patient said.  I called patient back and told her to call her insurance company to see why they are $100. For 10 pods.  She agreed to do this and call me back.  I told her I have time tomorrow to start her on this if she can pick them up from the pharmacy.

## 2022-05-14 DIAGNOSIS — E1165 Type 2 diabetes mellitus with hyperglycemia: Secondary | ICD-10-CM | POA: Diagnosis not present

## 2022-05-19 ENCOUNTER — Other Ambulatory Visit: Payer: Self-pay

## 2022-05-19 ENCOUNTER — Telehealth: Payer: Self-pay | Admitting: Nutrition

## 2022-05-19 MED ORDER — DEXCOM G6 SENSOR MISC: 1.0000 | 3 refills | Status: DC

## 2022-05-19 MED ORDER — DEXCOM G6 TRANSMITTER MISC: 1.0000 | 3 refills | Status: DC

## 2022-05-19 NOTE — Telephone Encounter (Signed)
Script sent to [facility] per patient request  °

## 2022-05-19 NOTE — Telephone Encounter (Signed)
Pt. Just called saying that it script needs to go to [place], for her insurance to pay for this.  Sorry.

## 2022-05-19 NOTE — Telephone Encounter (Signed)
Patient to start on the Omni Pod 5 pump. Please order her the Dexcom G6 sensors and transmitter.  She gets the libres from a mail order pharmacy but does not know which one.

## 2022-05-20 MED ORDER — DEXCOM G6 SENSOR MISC: 1.0000 | 3 refills | Status: DC

## 2022-05-20 MED ORDER — DEXCOM G6 TRANSMITTER MISC: 1.0000 | 3 refills | Status: DC

## 2022-05-20 NOTE — Telephone Encounter (Signed)
Script has been sent to [facility]

## 2022-05-20 NOTE — Addendum Note (Signed)
Addended by: Jefferson Fuel on: 05/20/2022 06:53 AM   Modules accepted: Orders

## 2022-05-27 DIAGNOSIS — E119 Type 2 diabetes mellitus without complications: Secondary | ICD-10-CM | POA: Diagnosis not present

## 2022-05-31 ENCOUNTER — Telehealth: Payer: Self-pay | Admitting: Nutrition

## 2022-05-31 ENCOUNTER — Encounter: Payer: Medicare HMO | Attending: Internal Medicine | Admitting: Nutrition

## 2022-05-31 DIAGNOSIS — M810 Age-related osteoporosis without current pathological fracture: Secondary | ICD-10-CM | POA: Diagnosis not present

## 2022-05-31 DIAGNOSIS — M8588 Other specified disorders of bone density and structure, other site: Secondary | ICD-10-CM | POA: Diagnosis not present

## 2022-05-31 NOTE — Telephone Encounter (Signed)
Tried calling patient because she is 30 minutes late for her appointment.  Voice mail is full and can not leave message

## 2022-05-31 NOTE — Progress Notes (Addendum)
Patient did not bring her phone, so the G6 could not be started and the pump could not be set up.  Patient says she does not have a smart phone, so we did not set up her PDM.  She will get her phone and go the the [employer] store to see if she is able to download this app. IF not, I will see about getting her started on the Dash system.  I will try to get some samples of the dash pods for her to use until we can order them.   She will call me when she gets to the store to see if her phone is able to use the app.   Patient reports that she is only taking Novolog and has not gotten the Lantus from the drug store.  Reviewed her dosage with her, and told her to start this ASAP until we can get her back on the pump.

## 2022-06-01 ENCOUNTER — Ambulatory Visit: Payer: Medicare HMO | Admitting: Nutrition

## 2022-06-02 ENCOUNTER — Telehealth: Payer: Self-pay | Admitting: Nutrition

## 2022-06-02 NOTE — Telephone Encounter (Signed)
LVM on my machine that her phone is not capable of downloading the Dexcom app.  She is going to try to get a phone that will do this, and call me when she gets this.

## 2022-06-07 ENCOUNTER — Other Ambulatory Visit (HOSPITAL_COMMUNITY): Payer: Self-pay

## 2022-06-08 ENCOUNTER — Telehealth: Payer: Self-pay | Admitting: Internal Medicine

## 2022-06-08 ENCOUNTER — Other Ambulatory Visit (HOSPITAL_COMMUNITY): Payer: Self-pay

## 2022-06-08 NOTE — Telephone Encounter (Signed)
Unable to LVM.

## 2022-06-08 NOTE — Telephone Encounter (Signed)
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  Her bone densities have improved on the left but worsened on the right  I would suggest that she continues with Prolia at this time as well as calcium   We will discuss further plans on her next visit in August   Thanks

## 2022-06-09 ENCOUNTER — Other Ambulatory Visit: Payer: Self-pay | Admitting: *Deleted

## 2022-06-09 DIAGNOSIS — E109 Type 1 diabetes mellitus without complications: Secondary | ICD-10-CM

## 2022-06-09 MED ORDER — NOVOLOG FLEXPEN 100 UNIT/ML ~~LOC~~ SOPN: PEN_INJECTOR | SUBCUTANEOUS | 1 refills | Status: DC

## 2022-06-09 NOTE — Telephone Encounter (Signed)
Notified pt w/ Bone density results. Pt voiced understanding.

## 2022-06-14 ENCOUNTER — Telehealth: Payer: Self-pay

## 2022-06-14 ENCOUNTER — Other Ambulatory Visit: Payer: Self-pay | Admitting: Internal Medicine

## 2022-06-14 MED ORDER — TRESIBA FLEXTOUCH 100 UNIT/ML ~~LOC~~ SOPN: 10.0000 [IU] | PEN_INJECTOR | Freq: Every day | SUBCUTANEOUS | 3 refills | Status: DC

## 2022-06-14 NOTE — Telephone Encounter (Signed)
Pt.notified

## 2022-06-14 NOTE — Telephone Encounter (Signed)
Patient states that Lantus is not covered by insurance and needs and alternative

## 2022-06-15 DIAGNOSIS — H52203 Unspecified astigmatism, bilateral: Secondary | ICD-10-CM | POA: Diagnosis not present

## 2022-06-15 DIAGNOSIS — D23122 Other benign neoplasm of skin of left lower eyelid, including canthus: Secondary | ICD-10-CM | POA: Diagnosis not present

## 2022-06-15 DIAGNOSIS — E119 Type 2 diabetes mellitus without complications: Secondary | ICD-10-CM | POA: Diagnosis not present

## 2022-06-24 ENCOUNTER — Telehealth: Payer: Self-pay

## 2022-06-24 NOTE — Telephone Encounter (Signed)
Patient needs to know what her directions are for the Novolog since she is  not on the pump.

## 2022-06-25 DIAGNOSIS — L84 Corns and callosities: Secondary | ICD-10-CM | POA: Diagnosis not present

## 2022-06-25 DIAGNOSIS — M79676 Pain in unspecified toe(s): Secondary | ICD-10-CM | POA: Diagnosis not present

## 2022-06-25 DIAGNOSIS — E1142 Type 2 diabetes mellitus with diabetic polyneuropathy: Secondary | ICD-10-CM | POA: Diagnosis not present

## 2022-06-25 DIAGNOSIS — B351 Tinea unguium: Secondary | ICD-10-CM | POA: Diagnosis not present

## 2022-06-25 NOTE — Telephone Encounter (Signed)
Patient advised and verbalized understanding 

## 2022-06-28 DIAGNOSIS — E119 Type 2 diabetes mellitus without complications: Secondary | ICD-10-CM | POA: Diagnosis not present

## 2022-07-10 DIAGNOSIS — X58XXXA Exposure to other specified factors, initial encounter: Secondary | ICD-10-CM | POA: Diagnosis not present

## 2022-07-10 DIAGNOSIS — R739 Hyperglycemia, unspecified: Secondary | ICD-10-CM | POA: Diagnosis not present

## 2022-07-10 DIAGNOSIS — S86812A Strain of other muscle(s) and tendon(s) at lower leg level, left leg, initial encounter: Secondary | ICD-10-CM | POA: Diagnosis not present

## 2022-07-12 ENCOUNTER — Telehealth: Payer: Self-pay

## 2022-07-12 NOTE — Telephone Encounter (Signed)
Patient states that she is having a hard time walking and getting around and would like to know what you would advise.

## 2022-07-13 ENCOUNTER — Telehealth: Payer: Self-pay

## 2022-07-13 DIAGNOSIS — M810 Age-related osteoporosis without current pathological fracture: Secondary | ICD-10-CM | POA: Diagnosis not present

## 2022-07-13 DIAGNOSIS — E1065 Type 1 diabetes mellitus with hyperglycemia: Secondary | ICD-10-CM | POA: Diagnosis not present

## 2022-07-13 DIAGNOSIS — R5383 Other fatigue: Secondary | ICD-10-CM | POA: Diagnosis not present

## 2022-07-13 NOTE — Telephone Encounter (Signed)
Patient was advised and states wants to know if Metformin could help with the mobility issues. She says her friend uses it and it helps.

## 2022-07-13 NOTE — Telephone Encounter (Signed)
Patient advised and will follow up with pcp  

## 2022-07-13 NOTE — Telephone Encounter (Signed)
  Patient BS at UC was 544 and patient had not took Guinea-Bissau yesterday   07/14/22  1:02pm   75 and has not taking Guinea-B[place]au   Patient ate breakfast this morning    Patient states that sugar have been un and down but wasn't able to provide me with the readings.

## 2022-07-14 ENCOUNTER — Other Ambulatory Visit: Payer: Self-pay

## 2022-07-14 ENCOUNTER — Ambulatory Visit: Payer: Medicare HMO | Admitting: Internal Medicine

## 2022-07-14 ENCOUNTER — Encounter (HOSPITAL_COMMUNITY): Payer: Self-pay | Admitting: Emergency Medicine

## 2022-07-14 ENCOUNTER — Emergency Department (HOSPITAL_COMMUNITY)
Admission: EM | Admit: 2022-07-14 | Discharge: 2022-07-14 | Disposition: A | Discharge status: Home / Self Care | Payer: Medicare HMO | Attending: Emergency Medicine | Admitting: Emergency Medicine

## 2022-07-14 ENCOUNTER — Emergency Department (HOSPITAL_COMMUNITY): Payer: Medicare HMO

## 2022-07-14 DIAGNOSIS — R55 Syncope and collapse: Secondary | ICD-10-CM | POA: Diagnosis not present

## 2022-07-14 DIAGNOSIS — Z743 Need for continuous supervision: Secondary | ICD-10-CM | POA: Diagnosis not present

## 2022-07-14 DIAGNOSIS — E162 Hypoglycemia, unspecified: Secondary | ICD-10-CM

## 2022-07-14 DIAGNOSIS — Z7982 Long term (current) use of aspirin: Secondary | ICD-10-CM | POA: Insufficient documentation

## 2022-07-14 DIAGNOSIS — Z794 Long term (current) use of insulin: Secondary | ICD-10-CM | POA: Insufficient documentation

## 2022-07-14 DIAGNOSIS — E11649 Type 2 diabetes mellitus with hypoglycemia without coma: Secondary | ICD-10-CM | POA: Diagnosis not present

## 2022-07-14 DIAGNOSIS — I10 Essential (primary) hypertension: Secondary | ICD-10-CM | POA: Diagnosis not present

## 2022-07-14 DIAGNOSIS — R410 Disorientation, unspecified: Secondary | ICD-10-CM | POA: Diagnosis not present

## 2022-07-14 LAB — BLOOD GAS, VENOUS
Acid-Base Excess: 3 mmol/L — ABNORMAL HIGH (ref 0.0–2.0)
Bicarbonate: 29.4 mmol/L — ABNORMAL HIGH (ref 20.0–28.0)
Drawn by: 1528
O2 Saturation: 27.7 %
Patient temperature: 36.4
pCO2, Ven: 51 mmHg (ref 44–60)
pH, Ven: 7.37 (ref 7.25–7.43)
pO2, Ven: <31 mmHg — CL (ref 32–45)

## 2022-07-14 LAB — COMPREHENSIVE METABOLIC PANEL
ALT: 70 U/L — ABNORMAL HIGH (ref 0–44)
AST: 51 U/L — ABNORMAL HIGH (ref 15–41)
Albumin: 4.2 g/dL (ref 3.5–5.0)
Alkaline Phosphatase: 51 U/L (ref 38–126)
Anion gap: 10 (ref 5–15)
BUN: 34 mg/dL — ABNORMAL HIGH (ref 8–23)
CO2: 28 mmol/L (ref 22–32)
Calcium: 9.8 mg/dL (ref 8.9–10.3)
Chloride: 99 mmol/L (ref 98–111)
Creatinine, Ser: 0.84 mg/dL (ref 0.44–1.00)
GFR, Estimated: >60 mL/min (ref >60)
Glucose, Bld: 220 mg/dL — ABNORMAL HIGH (ref 70–99)
Potassium: 4.2 mmol/L (ref 3.5–5.1)
Sodium: 137 mmol/L (ref 135–145)
Total Bilirubin: 0.4 mg/dL (ref 0.3–1.2)
Total Protein: 7.5 g/dL (ref 6.5–8.1)

## 2022-07-14 LAB — CBC
HCT: 37.4 % (ref 36.0–46.0)
Hemoglobin: 12.1 g/dL (ref 12.0–15.0)
MCH: 30.3 pg (ref 26.0–34.0)
MCHC: 32.4 g/dL (ref 30.0–36.0)
MCV: 93.7 fL (ref 80.0–100.0)
Platelets: 247 10*3/uL (ref 150–400)
RBC: 3.99 MIL/uL (ref 3.87–5.11)
RDW: 13.7 % (ref 11.5–15.5)
WBC: 6.2 10*3/uL (ref 4.0–10.5)
nRBC: 0 % (ref 0.0–0.2)

## 2022-07-14 LAB — CBG MONITORING, ED
Glucose-Capillary: 65 mg/dL — ABNORMAL LOW (ref 70–99)
Glucose-Capillary: 183 mg/dL — ABNORMAL HIGH (ref 70–99)
Glucose-Capillary: 311 mg/dL — ABNORMAL HIGH (ref 70–99)

## 2022-07-14 LAB — URINALYSIS, ROUTINE W REFLEX MICROSCOPIC
Bilirubin Urine: NEGATIVE
Glucose, UA: NEGATIVE mg/dL
Hgb urine dipstick: NEGATIVE
Ketones, ur: NEGATIVE mg/dL
Leukocytes,Ua: NEGATIVE
Nitrite: NEGATIVE
Protein, ur: NEGATIVE mg/dL
Specific Gravity, Urine: 1.005 (ref 1.005–1.030)
pH: 6 (ref 5.0–8.0)

## 2022-07-14 MED ORDER — DEXTROSE 50 % IV SOLN: 1.0000 | Freq: Once | INTRAVENOUS | Status: AC

## 2022-07-14 MED ORDER — SODIUM CHLORIDE 0.9% FLUSH
3.0000 mL | Freq: Two times a day (BID) | INTRAVENOUS | Status: DC
  Administered 2022-07-14: 3 mL via INTRAVENOUS

## 2022-07-14 MED ORDER — SODIUM CHLORIDE 0.9% FLUSH: 3.0000 mL | INTRAVENOUS | Status: DC | PRN

## 2022-07-14 MED ORDER — SODIUM CHLORIDE 0.9 % IV SOLN: 250.0000 mL | INTRAVENOUS | Status: DC | PRN

## 2022-07-14 MED ORDER — DEXTROSE 50 % IV SOLN
INTRAVENOUS | Status: AC
  Administered 2022-07-14: 50 mL via INTRAVENOUS
  Filled 2022-07-14: qty 50

## 2022-07-14 MED ORDER — SODIUM CHLORIDE 0.9 % IV BOLUS
500.0000 mL | Freq: Once | INTRAVENOUS | Status: AC
  Administered 2022-07-14: 500 mL via INTRAVENOUS

## 2022-07-14 NOTE — Discharge Instructions (Addendum)
Sliding Scale Insulin Novolog:  Check your blood sugars before every meal and at night.  If BS <70, drink oj or eat something BS 71-120:  no insulin 120-150:  1 unit 151-200:  2 units 201-250:  3 units 251-300:  4 units 301-350:  5 units 351-400:  6 units >401:  8 units

## 2022-07-14 NOTE — ED Triage Notes (Signed)
Pt had syncopal episode and when ems arrived pt blood sugar was 32. Ems gave pt 200cc of D10.

## 2022-07-14 NOTE — ED Provider Notes (Signed)
[place] EMERGENCY DEPARTMENT AT [facility] Provider Note   CSN: [id number] Arrival date & time: 07/14/22  2103     History  Chief Complaint  Patient presents with   Hypoglycemia    Audrey [name] is a 77 y.o. female.  Pt is a 77 yo female with pmhx significant for DM, HLD, and hx pericardial effusion.  Pt was at [employer] and had a syncopal event.  She remembers feeling very bad and slumped over.  EMS said BS was 32.  Pt did go to UC on 5/4 because of hyperglycemia.  It looks like she had not been taking her novolog, just her tresiba.  It looks like she called endocrinology for an appt, but has not yet seen them.  She is now taking the novolog.       Home Medications Prior to Admission medications   Medication Sig Start Date End Date Taking? Authorizing Provider  insulin degludec (TRESIBA FLEXTOUCH) 100 UNIT/ML FlexTouch Pen Inject 10 Units into the skin daily. 06/14/22   Shamleffer, Konrad Dolores, MD  Insulin Pen Needle 32G X 4 MM MISC Use 4x a day 05/05/22   Carlus Pavlov, MD  ALPRAZolam Prudy Feeler) 0.5 MG tablet Take 0.5 mg by mouth every 6 (six) hours as needed for anxiety. Patient not taking: Reported on 04/28/2022    [provider]  aspirin 81 MG tablet Take 81 mg by mouth every other day.     [provider]  Calcium Carb-Cholecalciferol 600-800 MG-UNIT TABS Take by mouth.    [provider]  CINNAMON PO Take 1,000 mg by mouth.    [provider]  Continuous Blood Gluc Sensor (DEXCOM G6 SENSOR) MISC 1 Device by Does not apply route as directed. 05/20/22   Shamleffer, Konrad Dolores, MD  Continuous Blood Gluc Transmit (DEXCOM G6 TRANSMITTER) MISC 1 Device by Does not apply route as directed. 05/20/22   Shamleffer, Konrad Dolores, MD  diazepam (VALIUM) 5 MG tablet Take 5 mg by mouth every 6 (six) hours as needed for anxiety. Patient not taking: Reported on 04/28/2022    [provider]  diphenhydrAMINE (BENADRYL)  25 mg capsule Take 25 mg by mouth every 6 (six) hours as needed.    [provider]  glucose blood (ONETOUCH ULTRA) test strip 1 each by Other route in the morning, at noon, in the evening, and at bedtime. E11.9 07/24/19   Romero Belling, MD  insulin aspart (NOVOLOG FLEXPEN) 100 UNIT/ML FlexPen Inject under skin as previously through the pump, up to 10 units a day 06/09/22   Carlus Pavlov, MD  insulin aspart (NOVOLOG) 100 UNIT/ML injection USE WITH INSULIN PUMP 40   UNITS DAILY 12/25/21   Shamleffer, Konrad Dolores, MD  Insulin Disposable Pump (OMNIPOD 5 G6 INTRO, GEN 5,) KIT 1 Device by Does not apply route every other day. 04/28/22   Shamleffer, Konrad Dolores, MD  Insulin Disposable Pump (OMNIPOD 5 G6 PODS, GEN 5,) MISC 1 Device by Does not apply route every 3 (three) days. 04/28/22   Shamleffer, Konrad Dolores, MD  Insulin Disposable Pump (OMNIPOD CLASSIC PODS, GEN 3,) MISC 1 Device by Does not apply route every 3 (three) days. 05/04/22   Shamleffer, Konrad Dolores, MD  Lancets [facility] ULTRASOFT) lancets 1 each by Other route 4 (four) times daily. E11.9 07/24/19   Romero Belling, MD  multivitamin-lutein [facility]) CAPS capsule Take 1 capsule by mouth daily.    [provider]  pravastatin (PRAVACHOL) 40 MG tablet Take 80  mg by mouth daily.    [provider]  TURMERIC PO Take by mouth.    [provider]  vitamin C (ASCORBIC ACID) 500 MG tablet Take 500 mg by mouth daily.    [provider]      Allergies    Morphine and related and Januvia [sitagliptin]    Review of Systems   Review of Systems  Neurological:  Positive for syncope.  All other systems reviewed and are negative.   Physical Exam Updated Vital Signs BP 132/68 (BP Location: Left Arm)   Pulse 75   Temp (!) 97.1 F (36.2 C) (Oral)   Resp 18   Ht 5\' 4"  (1.626 m)   Wt 50 kg   SpO2 100%   BMI 18.92 kg/m  Physical Exam Vitals and nursing note reviewed.  Constitutional:       Appearance: Normal appearance. She is diaphoretic.  HENT:     Head: Normocephalic and atraumatic.     Right Ear: External ear normal.     Left Ear: External ear normal.     Nose: Nose normal.     Mouth/Throat:     Mouth: Mucous membranes are dry.  Eyes:     Extraocular Movements: Extraocular movements intact.     Pupils: Pupils are equal, round, and reactive to light.  Cardiovascular:     Rate and Rhythm: Normal rate and regular rhythm.     Pulses: Normal pulses.     Heart sounds: Normal heart sounds.  Pulmonary:     Effort: Pulmonary effort is normal.     Breath sounds: Normal breath sounds.  Abdominal:     General: Abdomen is flat. Bowel sounds are normal.     Palpations: Abdomen is soft.  Musculoskeletal:        General: Normal range of motion.     Cervical back: Normal range of motion and neck supple.  Skin:    General: Skin is warm.     Capillary Refill: Capillary refill takes less than 2 seconds.  Neurological:     General: No focal deficit present.     Mental Status: She is alert and oriented to person, place, and time.  Psychiatric:        Mood and Affect: Mood normal.        Behavior: Behavior normal.    ED Results / Procedures / Treatments   Labs (all labs ordered are listed, but only abnormal results are displayed) Labs Reviewed  COMPREHENSIVE METABOLIC PANEL - Abnormal; Notable for the following components:      Result Value   Glucose, Bld 220 (*)    BUN 34 (*)    AST 51 (*)    ALT 70 (*)    All other components within normal limits  URINALYSIS, ROUTINE W REFLEX MICROSCOPIC - Abnormal; Notable for the following components:   Color, Urine STRAW (*)    All other components within normal limits  BLOOD GAS, VENOUS - Abnormal; Notable for the following components:   pO2, Ven <31 (*)    Bicarbonate 29.4 (*)    Acid-Base Excess 3.0 (*)    All other components within normal limits  CBG MONITORING, ED - Abnormal; Notable for the following components:    Glucose-Capillary 65 (*)    All other components within normal limits  CBG MONITORING, ED - Abnormal; Notable for the following components:   Glucose-Capillary 183 (*)    All other components within normal limits  CBG MONITORING, ED - Abnormal;  Notable for the following components:   Glucose-Capillary 311 (*)    All other components within normal limits  CBC    EKG EKG Interpretation  Date/Time:  Wednesday Jul 14 2022 21:27:18 EDT Ventricular Rate:  75 PR Interval:  188 QRS Duration: 93 QT Interval:  408 QTC Calculation: 456 R Axis:   63 Text Interpretation: Sinus rhythm Atrial premature complex Right atrial enlargement No significant change since last tracing Confirmed by Jacalyn Lefevre [phone]) on 07/14/2022 9:50:46 PM  Radiology DG Chest Port 1 View  Result Date: 07/14/2022 CLINICAL DATA:  Syncope. EXAM: PORTABLE CHEST 1 VIEW COMPARISON:  03/06/2012. FINDINGS: The heart size and mediastinal contours are within normal limits. There is atherosclerotic calcification of the aorta. No consolidation, effusion, or pneumothorax. No acute osseous abnormality. IMPRESSION: No active disease. Electronically Signed   By: Thornell Sartorius M.D.   On: 07/14/2022 21:45    Procedures Procedures    Medications Ordered in ED Medications  sodium chloride flush (NS) 0.9 % injection 3 mL (3 mLs Intravenous Given 07/14/22 2227)  sodium chloride flush (NS) 0.9 % injection 3 mL (has no administration in time range)  0.9 %  sodium chloride infusion (has no administration in time range)  sodium chloride 0.9 % bolus 500 mL (500 mLs Intravenous New Bag/Given 07/14/22 2227)  dextrose 50 % solution 50 mL (50 mLs Intravenous Given 07/14/22 2127)    ED Course/ Medical Decision Making/ A&P                             Medical Decision Making Amount and/or Complexity of Data Reviewed Labs: ordered. Radiology: ordered.  Risk Prescription drug management.   This patient presents to the ED for concern of  hypoglycemia, this involves an extensive number of treatment options, and is a complaint that carries with it a high risk of complications and morbidity.  The differential diagnosis includes infection, too much insulin   Co morbidities that complicate the patient evaluation  DM, HLD, and hx pericardial effusion   Additional history obtained:  Additional history obtained from epic chart review External records from outside source obtained and reviewed including EMS report   Lab Tests:  I Ordered, and personally interpreted labs.  The pertinent results include:  cbc nl, cmp nl other than glucose 220 (after d50), vbg nl   Imaging Studies ordered:  I ordered imaging studies including cxr  I independently visualized and interpreted imaging which showed No active disease.  I agree with the radiologist interpretation   Cardiac Monitoring:  The patient was maintained on a cardiac monitor.  I personally viewed and interpreted the cardiac monitored which showed an underlying rhythm of: nsr   Medicines ordered and prescription drug management:  I ordered medication including d50 and ivfs  for hypoglycemia  Reevaluation of the patient after these medicines showed that the patient improved I have reviewed the patients home medicines and have made adjustments as needed  Problem List / ED Course:  Hypoglycemia:  she has not been checking her blood sugars.  She has been randomly taking novolog.  She is encouraged to check her bs and is to use a sliding scale insulin.   Reevaluation:  After the interventions noted above, I reevaluated the patient and found that they have :improved   Social Determinants of Health:  Lives at home   Dispostion:  After consideration of the diagnostic results and the patients response to treatment, I feel that  the patent would benefit from discharge with outpatient f/u.          Final Clinical Impression(s) / ED Diagnoses Final diagnoses:   Hypoglycemia    Rx / DC Orders ED Discharge Orders     None         Jacalyn Lefevre, MD 07/14/22 2324

## 2022-07-14 NOTE — ED Notes (Signed)
Critical PO2 less than 31. From venous blood gas.

## 2022-07-15 ENCOUNTER — Telehealth: Payer: Self-pay

## 2022-07-15 NOTE — Telephone Encounter (Signed)
Mobile phone number said not available and the phone voicemail is full. Will try again later.

## 2022-07-15 NOTE — Telephone Encounter (Signed)
Patient had to go to the ED for low BS and would and needs to have medication adjustment.  I did schedule her for Monday at 11:10 am but she wants to know if changes can be made over phone instead of coming in if possible.

## 2022-07-16 ENCOUNTER — Telehealth: Payer: Self-pay | Admitting: Dietician

## 2022-07-16 ENCOUNTER — Ambulatory Visit: Payer: Medicare HMO | Admitting: Internal Medicine

## 2022-07-16 NOTE — Telephone Encounter (Signed)
Pt contacted and advised I am going to make a best educated guess and states she needs to decrease her Tresiba from 10 units to 8 units.  But without glucose data it is hard for me to make the right judgment for her  My suggestion is she needs to check her glucose 3 times a day before each meal, breakfast, lunch, dinner  She needs to write those readings down with the time and the date She also needs to add the amount of insulin that she has been taking  That way when I see her on Monday I can make the right decision for her  Her sugars have been going from low to 500 which is very unhealthy for the patient and this needs to be adjusted, I would recommend that the patient comes for visit   Pt scheduled to be seen Tuesday 07/20/22.

## 2022-07-16 NOTE — Telephone Encounter (Signed)
Called patient per staff note that patient needs insulin instruction and has a difficult time with transportation.  I was out of the office 5/9 and was not able to see her.  Called patient on home phone- cell phone is not functioning. Asked what insulin is she taking? - Audrey [name] - was taking 10 units but instructed to take 8 units today.  She states she takes this at varying times of day depending on what her blood glucose is. Discussed that Audrey [name] is a long acting insulin and lasts at least 24 hours.  This insulin should be only taken one time per day.  She thinks that she took it last sometime this afternoon.  Instructed her that she should not take the [place] again until tomorrow afternoon.  She states that she has only taken this only once today. She states that she was told not to take the Novolog as she has been having low blood glucose.  Asked if any of the times she has had low glucose if she took [place] more frequently than once daily and she stated that she did not know.  Chart reviewed.  Blood glucose of 32 on 07/14/2022 when she went to the ED.  She was in church and forgot her Josephine Igo reader at home and therefore did not get an alarm.  She is not taking the Novolog today.  Blood glucose fasting today was 295 and has been higher since and sensor reading was 381 tonight at the time of call.  Called Dr. Lonzo Cloud who is the on call MD tonight.  Orders provided and instructed patient as below: Patient should take [place] each night at 8 pm.  - to start this tomorrow as she has taken this already this afternoon.  She should leave this pen on her bedside table. Patient should resume Novolog per sliding scale - Sensitivity factor 50 with a goal of 130.  She should leave her pen on her kitchen table where she eats.  180-230 = 1 unit  231-280 = 2 units  281-330 = 3 units  331-380 = 4 units  381-430 = 5 units  431-480 = 6 units  Patient verbalized this and wrote the above instructions  down.  Also, instructed her to take 2 units of Novolog now as she has already eaten and her blood glucose was 381 after this meal.  Discussed that we do not want to give too much to avoid low blood glucose.  Instructed her to put some candy by her bed and in her purse in the event of a low event.  She will continue to use the FreeStyle Libre to monitor her glucose. She is scheduled to see Dr. Lonzo Cloud on May 14 to better assist with blood glucose management.  Oran Rein, RD, LDN, CDCES

## 2022-07-19 ENCOUNTER — Ambulatory Visit: Payer: Medicare HMO | Admitting: Internal Medicine

## 2022-07-20 ENCOUNTER — Encounter: Payer: Self-pay | Admitting: Internal Medicine

## 2022-07-20 ENCOUNTER — Ambulatory Visit (INDEPENDENT_AMBULATORY_CARE_PROVIDER_SITE_OTHER): Payer: Medicare HMO | Admitting: Internal Medicine

## 2022-07-20 VITALS — BP 118/70 | HR 67 | Ht 64.0 in | Wt 107.0 lb

## 2022-07-20 DIAGNOSIS — E109 Type 1 diabetes mellitus without complications: Secondary | ICD-10-CM | POA: Diagnosis not present

## 2022-07-20 DIAGNOSIS — R4189 Other symptoms and signs involving cognitive functions and awareness: Secondary | ICD-10-CM | POA: Diagnosis not present

## 2022-07-20 DIAGNOSIS — E1065 Type 1 diabetes mellitus with hyperglycemia: Secondary | ICD-10-CM

## 2022-07-20 DIAGNOSIS — R945 Abnormal results of liver function studies: Secondary | ICD-10-CM | POA: Diagnosis not present

## 2022-07-20 LAB — POCT GLYCOSYLATED HEMOGLOBIN (HGB A1C): Hemoglobin A1C: 8.8 % — AB (ref 4.0–5.6)

## 2022-07-20 NOTE — Progress Notes (Signed)
Name: Audrey [name]  Age/ Sex: 77 y.o., female   MRN/ DOB: [id number], Mar 11, 1945     PCP: Lupita Raider, MD   Reason for Endocrinology Evaluation: Type 1 Diabetes Mellitus/Osteoporosis   Initial Endocrine Consultative Visit: 11/15/2013    [name] IDENTIFIER: Audrey [name] is a 77 y.o. female with a past medical history of T1DM and osteoporosis . Audrey [name] has followed with Endocrinology clinic since 11/15/2013 for consultative assistance with management of her diabetes.  DIABETIC HISTORY:  Audrey [name] was diagnosed with DM 2007, per chart review Audrey [name] was on oral glycemic agents but was started on insulin in 2011.  She was started on insulin pump in 2017 (initially Animas Vibe) . Her hemoglobin A1c has ranged from 5.9% in 2018, peaking at 8.5% in 2016.  OSTEOPOROSIS HISTORY: She was diagnosed with osteoporosis in 2008.  She was on alendronate 2008-2013.  She has history of wrist fracture in 2010 She was started on Prolia on 09/13/2018 in conjunction with Ibandronate, which I stopped in 10/2021 while continuing on Prolia   She was followed by Dr.Ellison  from 2015 until 06/2021  Of note Audrey [name] has had low C-peptide at 0.6 NG/mL in 2016 (no concomitant glucose), 0.68 NG/mL in 2017 with a concomitant glucose 195 mg/DL    [name] was not able to continue on Audrey OmniPod 07/26/2022, as they discontinued Audrey original OmniPod and Audrey new OmniPod was cost prohibitive for Audrey [name].  And was switched to multiple daily injections of insulin SUBJECTIVE:   During Audrey last visit (10/21/2021): A1c 6.3%     Today (07/20/2022): Audrey [name] is here for follow-up on diabetes management and osteoporosis.  Since her last visit here, Audrey [name] was not able to obtain OmniPod due to increase in cost.  [name] was switched to multiple daily injections, but she has been noted with severe hypoglycemia requiring ED visit with a BG of 32 mg/DL .  She is currently using freestyle libre to check  glucose at home Her glucose readings have shown severe glycemic excursions ranging from Audrey 30s to over 500 mg/DL  Based on conversation with Audrey staff, and our CDE, Audrey [name] has not been able to verify her insulin intake, and at times she would not remember how many times she had taking Audrey basal insulin, there is a questionable extra intake of more than once daily of basal insulin  Today she is accompanied by her Son and granddaughter   No nausea or vomiting No changes in bowel movement   HOME DIABETES REGIMEN:  Tresiba 8 units daily Novolog BG-130/50 Calcium citrate/Vitamin D  600 mg daily     Statin: yes ACE-I/ARB: no     DIABETIC COMPLICATIONS: Microvascular complications:   Denies:retinopathy, CKD , neuropathy  Last Eye Exam: Completed 2023  Macrovascular complications:   Denies: CAD, CVA, PVD   HISTORY:  Past Medical History:  Past Medical History:  Diagnosis Date   Diabetes mellitus (HCC)    Hyperlipidemia    Osteopenia    2002 T-Score -2   Pericardial effusion    PONV (postoperative nausea and vomiting)    Seasonal allergies    Past Surgical History:  Past Surgical History:  Procedure Laterality Date   rectal polyps removed     right wrist fx     SUBXYPHOID PERICARDIAL WINDOW  02/18/2012   Procedure: SUBXYPHOID PERICARDIAL WINDOW;  Surgeon: Kerin Perna, MD;  Location: [facility] OR;  Service: Thoracic;  Laterality: N/A;  TEE  Social History:  reports that she has never smoked. She has never used smokeless tobacco. She reports that she does not drink alcohol and does not use drugs. Family History:  Family History  Problem Relation Age of Onset   Heart attack Father    Diabetes Brother    Heart disease Brother    Cancer Sister        ovarian     HOME MEDICATIONS: Allergies as of 07/20/2022       Reactions   Morphine And Related    Januvia [sitagliptin]         Medication List        Accurate as of Jul 20, 2022 12:20 PM. If you  have any questions, ask your nurse or doctor.          ALPRAZolam 0.5 MG tablet Commonly known as: XANAX Take 0.5 mg by mouth every 6 (six) hours as needed for anxiety.   ascorbic acid 500 MG tablet Commonly known as: VITAMIN C Take 500 mg by mouth daily.   aspirin 81 MG tablet Take 81 mg by mouth every other day.   Calcium Carb-Cholecalciferol 600-800 MG-UNIT Tabs Take by mouth.   CINNAMON PO Take 1,000 mg by mouth.   Dexcom G6 Sensor Misc 1 Device by Does not apply route as directed.   Dexcom G6 Transmitter Misc 1 Device by Does not apply route as directed.   diazepam 5 MG tablet Commonly known as: VALIUM Take 5 mg by mouth every 6 (six) hours as needed for anxiety.   diphenhydrAMINE 25 mg capsule Commonly known as: BENADRYL Take 25 mg by mouth every 6 (six) hours as needed.   insulin aspart 100 UNIT/ML injection Commonly known as: NovoLOG USE WITH INSULIN PUMP 40   UNITS DAILY   NovoLOG FlexPen 100 UNIT/ML FlexPen Generic drug: insulin aspart Inject under skin as previously through Audrey pump, up to 10 units a day   Insulin Pen Needle 32G X 4 MM Misc Use 4x a day   multivitamin-lutein Caps capsule Take 1 capsule by mouth daily.   Omnipod 5 G6 Pods (Gen 5) Misc 1 Device by Does not apply route every 3 (three) days.   Omnipod 5 G6 Intro (Gen 5) Kit 1 Device by Does not apply route every other day.   Omnipod Classic Pods (Gen 3) Misc 1 Device by Does not apply route every 3 (three) days.   OneTouch Ultra test strip Generic drug: glucose blood 1 each by Other route in Audrey morning, at noon, in Audrey evening, and at bedtime. E11.9   onetouch ultrasoft lancets 1 each by Other route 4 (four) times daily. E11.9   pravastatin 40 MG tablet Commonly known as: PRAVACHOL Take 80 mg by mouth daily.   Evaristo Bury FlexTouch 100 UNIT/ML FlexTouch Pen Generic drug: insulin degludec Inject 10 Units into Audrey skin daily. What changed: how much to take   TURMERIC  PO Take by mouth.         OBJECTIVE:   Vital Signs: BP 118/70 (BP Location: Left Arm, [name] Position: Sitting, Cuff Size: Small)   Pulse 67   Ht 5\' 4"  (1.626 m)   Wt 107 lb (48.5 kg)   SpO2 94%   BMI 18.37 kg/m   Wt Readings from Last 3 Encounters:  07/20/22 107 lb (48.5 kg)  07/14/22 110 lb 3.7 oz (50 kg)  04/28/22 110 lb 3.2 oz (50 kg)     Exam: General: Pt appears well and is in NAD  Lungs: Clear with good BS bilat   Heart: RRR   Extremities: No pretibial edema.     DM foot exam: 10/21/2021  Audrey skin of Audrey feet is intact without sores or ulcerations, left great toe nail onychomycosis  Audrey pedal pulses are 2+ on right and 2+ on left. Audrey sensation is intact to a screening 5.07, 10 gram monofilament bilaterally   DATA REVIEWED:  Lab Results  Component Value Date   HGBA1C 8.8 (A) 07/20/2022   HGBA1C 6.4 (A) 04/28/2022   HGBA1C 6.3 (A) 10/21/2021   Lab Results  Component Value Date   MICROALBUR 2.1 (H) 02/24/2015   LDLCALC 77 01/16/2014   CREATININE 0.84 07/14/2022   Lab Results  Component Value Date   MICRALBCREAT 2.1 02/24/2015     Lab Results  Component Value Date   CHOL 135 01/16/2014   HDL 44.00 01/16/2014   LDLCALC 77 01/16/2014   TRIG 68.0 01/16/2014   CHOLHDL 3 01/16/2014       DXA 05/28/2020   T score  AP spine -1.5  RFN -2.4  Right total hip -1.7  LFN -2.6  Left total hip -2.1    ASSESSMENT / PLAN / RECOMMENDATIONS:   1) Type 1 Diabetes Mellitus, Poorly controlled, Without complications - Most recent A1c of 8.8 %. Goal A1c < 7.0 %.     -Since her last visit here, Audrey [name] has not been able to receive Audrey new version of Audrey OmniPod due to cost , prior to that she has been on Audrey original OmniPod which was discontinued.  -We opted to switch her to multiple daily injections of insulin, but Audrey [name] has been struggling, and there appears evidence of cognitive impairment, she has not been able to verify Audrey amount of insulin  doses nor Audrey frequency of insulin intake with multiple staff members. -Three  days ago she was told by our CDE to decrease her Tresiba to 8 units daily, during Audrey visit today she had to verify Audrey dose with her son, she has not been able to verify with certainty if she took NovoLog today with breakfast no last night with supper, [name] states she pretty sure has taken her insulin. -In reviewing her CGM data, Audrey [name] has been noted with hypoglycemic episode in Audrey 50s overnight with severe hyperglycemia >350 mg/dL , during Audrey day.  Audrey hypoglycemia is due to basal insulin, and Audrey hyperglycemia has been due to lack of prandial insulin -We discussed Audrey pharmacokinetics of basal/prandial insulin and Audrey importance of each one -Over Audrey past 2 nights she has been noted with BG's of 300 mg/dL.  I am unclear if her prior hypoglycemia overnight was due to doubling on Tresiba and taking it more than once a day, or her most recent hyperglycemia and night is due to Audrey lack of [place]?  I we did 3 days ago is decrease her Tresiba from 10 units a day to 8 units a day, that should not be responsible for increasing her BG's from Audrey 50s to over 300 ?? -I did recommend moving to Audrey [name] assistance facility, that way she can continue to have independence but will have someone managing her insulin -[name] requested metformin, but I did explain to Audrey [name] that metformin is not going to bring BG's that are in Audrey 300s down to Audrey 100s.  She also has been labeled as having type I DM, [name] with undetectable C-peptide in Audrey past, which is not indicative of lack of  endogenous insulin production. -I am not going to change her Evaristo Bury at this time without confirmation that she has actually been taking it as prescribed, due to Audrey risk of hypoglycemia -It also appears that Audrey [name] has not been counting her carbohydrates, but entering glucose in place of carbohydrates when she was on Audrey pump.  I am going to  give her a standing dose of NovoLog with each meal, to minimize confusion I will add this in a simplified way and a scale to be used before each meal ,I did advise Audrey [name] not to use it postmeal to prevent hypoglycemia I did  MEDICATIONS: Tresiba 8 units every evening Novolog 4 units 3 times daily before every meal CF : NovoLog (BG -130/50) TIDQAC  EDUCATION / INSTRUCTIONS: BG monitoring instructions: [name] is instructed to check her blood sugars 3 times a day,. Call [place] [place] [facility] if: BG persistently < 70 I reviewed Audrey Rule of 15 for Audrey treatment of hypoglycemia in detail with Audrey [name]. Literature supplied.   2) Diabetic complications:  Eye: Does not have known diabetic retinopathy.  Neuro/ Feet: Does  have known diabetic peripheral neuropathy .  Renal: [name] does not have known baseline CKD. She   is not on an ACEI/ARB at present.    3) Osteoporosis  -She was on alendronate 2008-2013 -She was started on Prolia and ibandronate at Audrey same time in 2020 -First Prolia injection received on 09/13/2018 -I stopped ibandronate in 10/2021 but continued Prolia -She received her injection of Prolia today 04/28/2022 -She is due to have her bone density scheduled at [facility], she was provided with contact information on Audrey last visit   Medication  Continue Calcium Citrate /VitD 600 BID  Continue Prolia 60 mg SQ Q 6 mos  F/U in 6 months     Addendum: I have left a message to her PCP 07/20/2022 1430 to discuss cognitive impairment   I spent 45 minutes preparing to see Audrey [name] by review of recent labs, imaging and procedures, obtaining and reviewing separately obtained history, communicating with Audrey [name]/family or caregiver, ordering medications, tests or procedures, and documenting clinical information in Audrey EHR including Audrey differential Dx, treatment, and any further evaluation and other management    Signed electronically by: Lyndle Herrlich,  MD  [facility]  [facility] [address] [place], [place] [address] Phone: [phone] FAX: [phone]   CC: Lupita Raider, MD [address] [place] [address] Phone: [phone]  Fax: [phone]  Return to Endocrinology clinic as below: Future Appointments  Date Time Provider Department Center  11/22/2022  9:50 AM Aujanae Mccullum, Konrad Dolores, MD [facility] None

## 2022-07-20 NOTE — Patient Instructions (Addendum)
Tresiba 8 units every night  Novolog correctional insulin: Use the scale below to help guide you Before Each meal   Blood sugar before meal Number of units to inject  Less than 80 0 unit  81 -  175 4 units  175 - 220 5 units  221 - 265 6 units  266 - 310 7 units  311 - 355 8 units  356 - 400 9 units  401- 445 10 units  446 - 490 11 units      HOW TO TREAT LOW BLOOD SUGARS (Blood sugar LESS THAN 70 MG/DL) Please follow the RULE OF 15 for the treatment of hypoglycemia treatment (when your (blood sugars are less than 70 mg/dL)   STEP 1: Take 15 grams of carbohydrates when your blood sugar is low, which includes:  3-4 GLUCOSE TABS  OR 3-4 OZ OF JUICE OR REGULAR SODA OR ONE TUBE OF GLUCOSE GEL    STEP 2: RECHECK blood sugar in 15 MINUTES STEP 3: If your blood sugar is still low at the 15 minute recheck --> then, go back to STEP 1 and treat AGAIN with another 15 grams of carbohydrates.

## 2022-07-21 ENCOUNTER — Encounter: Payer: Self-pay | Admitting: Internal Medicine

## 2022-07-22 ENCOUNTER — Other Ambulatory Visit: Payer: Self-pay | Admitting: Internal Medicine

## 2022-07-22 ENCOUNTER — Telehealth: Payer: Self-pay

## 2022-07-22 DIAGNOSIS — E109 Type 1 diabetes mellitus without complications: Secondary | ICD-10-CM

## 2022-07-22 MED ORDER — NOVOLOG FLEXPEN 100 UNIT/ML ~~LOC~~ SOPN
PEN_INJECTOR | SUBCUTANEOUS | 3 refills | Status: DC
Start: 2022-07-22 — End: 2023-04-01

## 2022-07-22 NOTE — Telephone Encounter (Signed)
Can you send and update script for the Novolog to [facility].

## 2022-07-22 NOTE — Telephone Encounter (Signed)
Pt has been notified and voices understanding.  

## 2022-07-29 DIAGNOSIS — E119 Type 2 diabetes mellitus without complications: Secondary | ICD-10-CM | POA: Diagnosis not present

## 2022-08-04 ENCOUNTER — Telehealth: Payer: Self-pay

## 2022-08-04 NOTE — Telephone Encounter (Signed)
Novolog needs PA

## 2022-08-05 ENCOUNTER — Other Ambulatory Visit (HOSPITAL_COMMUNITY): Payer: Self-pay

## 2022-08-05 ENCOUNTER — Telehealth: Payer: Self-pay

## 2022-08-05 NOTE — Telephone Encounter (Signed)
Patient Advocate Encounter   Received notification from pt msgs that prior authorization is required for Novolog  Per test claim: Too soon to refill    Next fill date: 08/24/22

## 2022-08-05 NOTE — Telephone Encounter (Signed)
So patient received letter stating that they will not be covering medication going forward and a temporary supply was given. Can we find out what will be covered

## 2022-08-10 ENCOUNTER — Other Ambulatory Visit (HOSPITAL_COMMUNITY): Payer: Self-pay

## 2022-08-10 NOTE — Telephone Encounter (Signed)
Per patient it was suppose to be right away but maybe she misread it.

## 2022-08-10 NOTE — Telephone Encounter (Signed)
Do you know when they said they will stop covering?   Because as of today, the test claim for novolog is still showing coverage

## 2022-08-11 DIAGNOSIS — E1165 Type 2 diabetes mellitus with hyperglycemia: Secondary | ICD-10-CM | POA: Diagnosis not present

## 2022-09-01 DIAGNOSIS — E119 Type 2 diabetes mellitus without complications: Secondary | ICD-10-CM | POA: Diagnosis not present

## 2022-09-13 NOTE — Telephone Encounter (Signed)
Prolia VOB initiated via [email or web address]  Next Prolia inj DUE: 10/28/22

## 2022-09-20 ENCOUNTER — Other Ambulatory Visit: Payer: Self-pay

## 2022-09-20 MED ORDER — FREESTYLE LIBRE 2 SENSOR MISC: 3 refills | Status: DC

## 2022-09-25 DIAGNOSIS — R0600 Dyspnea, unspecified: Secondary | ICD-10-CM | POA: Diagnosis not present

## 2022-09-25 DIAGNOSIS — R0602 Shortness of breath: Secondary | ICD-10-CM | POA: Diagnosis not present

## 2022-09-25 DIAGNOSIS — R052 Subacute cough: Secondary | ICD-10-CM | POA: Diagnosis not present

## 2022-09-25 DIAGNOSIS — J40 Bronchitis, not specified as acute or chronic: Secondary | ICD-10-CM | POA: Diagnosis not present

## 2022-10-01 DIAGNOSIS — E119 Type 2 diabetes mellitus without complications: Secondary | ICD-10-CM | POA: Diagnosis not present

## 2022-10-01 NOTE — Telephone Encounter (Addendum)
Prior auth required for PROLIA  COVERAGE DETAILS: Prolia will be subject to 20.0% coinsurance up to a $7,500.00 out of pocket max ($464.97 met). Once met, coverage increases to 100%. Administration is subject to $45.00 copay, which does contribute to OOP max. No deductible applies. The benefits provided on this Verification of Benefits form are Medical Benefits and are the patient's In-Network benefits for Prolia. If you would like Pharmacy Benefits for Prolia, please call [phone].

## 2022-10-05 DIAGNOSIS — R110 Nausea: Secondary | ICD-10-CM | POA: Diagnosis not present

## 2022-10-05 DIAGNOSIS — R059 Cough, unspecified: Secondary | ICD-10-CM | POA: Diagnosis not present

## 2022-10-05 DIAGNOSIS — R531 Weakness: Secondary | ICD-10-CM | POA: Diagnosis not present

## 2022-10-07 ENCOUNTER — Emergency Department (HOSPITAL_COMMUNITY)
Admission: EM | Admit: 2022-10-07 | Discharge: 2022-10-08 | Disposition: A | Discharge status: Home / Self Care | Payer: Medicare HMO | Source: Home / Self Care | Attending: Emergency Medicine | Admitting: Emergency Medicine

## 2022-10-07 ENCOUNTER — Emergency Department (HOSPITAL_COMMUNITY): Payer: Medicare HMO

## 2022-10-07 ENCOUNTER — Encounter (HOSPITAL_COMMUNITY): Payer: Self-pay | Admitting: Emergency Medicine

## 2022-10-07 ENCOUNTER — Other Ambulatory Visit: Payer: Self-pay

## 2022-10-07 DIAGNOSIS — E1065 Type 1 diabetes mellitus with hyperglycemia: Secondary | ICD-10-CM | POA: Insufficient documentation

## 2022-10-07 DIAGNOSIS — Z20822 Contact with and (suspected) exposure to covid-19: Secondary | ICD-10-CM | POA: Insufficient documentation

## 2022-10-07 DIAGNOSIS — Z7982 Long term (current) use of aspirin: Secondary | ICD-10-CM | POA: Diagnosis not present

## 2022-10-07 DIAGNOSIS — I6782 Cerebral ischemia: Secondary | ICD-10-CM | POA: Diagnosis not present

## 2022-10-07 DIAGNOSIS — R531 Weakness: Secondary | ICD-10-CM

## 2022-10-07 DIAGNOSIS — R739 Hyperglycemia, unspecified: Secondary | ICD-10-CM

## 2022-10-07 DIAGNOSIS — R42 Dizziness and giddiness: Secondary | ICD-10-CM | POA: Diagnosis not present

## 2022-10-07 DIAGNOSIS — Z794 Long term (current) use of insulin: Secondary | ICD-10-CM | POA: Diagnosis not present

## 2022-10-07 DIAGNOSIS — R410 Disorientation, unspecified: Secondary | ICD-10-CM | POA: Diagnosis not present

## 2022-10-07 DIAGNOSIS — E1165 Type 2 diabetes mellitus with hyperglycemia: Secondary | ICD-10-CM | POA: Diagnosis not present

## 2022-10-07 DIAGNOSIS — R112 Nausea with vomiting, unspecified: Secondary | ICD-10-CM | POA: Diagnosis not present

## 2022-10-07 DIAGNOSIS — R110 Nausea: Secondary | ICD-10-CM

## 2022-10-07 LAB — CBC WITH DIFFERENTIAL/PLATELET
Abs Immature Granulocytes: 0.01 10*3/uL (ref 0.00–0.07)
Basophils Absolute: 0 10*3/uL (ref 0.0–0.1)
Basophils Relative: 1 %
Eosinophils Absolute: 0.1 10*3/uL (ref 0.0–0.5)
Eosinophils Relative: 1 %
HCT: 33.3 % — ABNORMAL LOW (ref 36.0–46.0)
Hemoglobin: 10.7 g/dL — ABNORMAL LOW (ref 12.0–15.0)
Immature Granulocytes: 0 %
Lymphocytes Relative: 20 %
Lymphs Abs: 0.9 10*3/uL (ref 0.7–4.0)
MCH: 30.3 pg (ref 26.0–34.0)
MCHC: 32.1 g/dL (ref 30.0–36.0)
MCV: 94.3 fL (ref 80.0–100.0)
Monocytes Absolute: 0.3 10*3/uL (ref 0.1–1.0)
Monocytes Relative: 6 %
Neutro Abs: 3.2 10*3/uL (ref 1.7–7.7)
Neutrophils Relative %: 72 %
Platelets: 214 10*3/uL (ref 150–400)
RBC: 3.53 MIL/uL — ABNORMAL LOW (ref 3.87–5.11)
RDW: 13.4 % (ref 11.5–15.5)
WBC: 4.4 10*3/uL (ref 4.0–10.5)
nRBC: 0 % (ref 0.0–0.2)

## 2022-10-07 LAB — BASIC METABOLIC PANEL
Anion gap: 8 (ref 5–15)
BUN: 19 mg/dL (ref 8–23)
CO2: 28 mmol/L (ref 22–32)
Calcium: 9 mg/dL (ref 8.9–10.3)
Chloride: 100 mmol/L (ref 98–111)
Creatinine, Ser: 0.82 mg/dL (ref 0.44–1.00)
GFR, Estimated: >60 mL/min (ref >60)
Glucose, Bld: 315 mg/dL — ABNORMAL HIGH (ref 70–99)
Potassium: 4.4 mmol/L (ref 3.5–5.1)
Sodium: 136 mmol/L (ref 135–145)

## 2022-10-07 LAB — URINALYSIS, ROUTINE W REFLEX MICROSCOPIC
Bilirubin Urine: NEGATIVE
Glucose, UA: >=500 mg/dL — AB
Hgb urine dipstick: NEGATIVE
Ketones, ur: 5 mg/dL — AB
Leukocytes,Ua: NEGATIVE
Nitrite: NEGATIVE
Protein, ur: NEGATIVE mg/dL
Specific Gravity, Urine: 1.011 (ref 1.005–1.030)
pH: 6 (ref 5.0–8.0)

## 2022-10-07 LAB — CBG MONITORING, ED: Glucose-Capillary: 320 mg/dL — ABNORMAL HIGH (ref 70–99)

## 2022-10-07 LAB — SARS CORONAVIRUS 2 BY RT PCR: SARS Coronavirus 2 by RT PCR: NEGATIVE

## 2022-10-07 MED ORDER — LACTATED RINGERS IV BOLUS
1000.0000 mL | Freq: Once | INTRAVENOUS | Status: AC
  Administered 2022-10-07: 1000 mL via INTRAVENOUS

## 2022-10-07 MED ORDER — ONDANSETRON 4 MG PO TBDP: 4.0000 mg | ORAL_TABLET | Freq: Three times a day (TID) | ORAL | 0 refills | Status: DC | PRN

## 2022-10-07 MED ORDER — ONDANSETRON HCL 4 MG/2ML IJ SOLN
4.0000 mg | Freq: Once | INTRAMUSCULAR | Status: AC
  Administered 2022-10-07: 4 mg via INTRAVENOUS
  Filled 2022-10-07: qty 2

## 2022-10-07 MED ORDER — INSULIN ASPART 100 UNIT/ML IJ SOLN
5.0000 [IU] | Freq: Once | INTRAMUSCULAR | Status: AC
  Administered 2022-10-07: 5 [IU] via SUBCUTANEOUS
  Filled 2022-10-07: qty 1

## 2022-10-07 NOTE — Discharge Instructions (Signed)
You were seen for your nausea and generalized weakness in the emergency department.   At home, please take the Zofran as needed for any nausea that he may have.    Check your MyChart online for the results of any tests that had not resulted by the time you left the emergency department.   Follow-up with your primary doctor in 2-3 days regarding your visit.    Return immediately to the emergency department if you experience any of the following: Vomiting, abdominal pain, or any other concerning symptoms.    Thank you for visiting our Emergency Department. It was a pleasure taking care of you today.

## 2022-10-07 NOTE — ED Triage Notes (Signed)
Pt reports weakness, dizziness since Sunday; BIB friend who states pt was seen at [facility] Monday and was told to come to ER for fluids

## 2022-10-07 NOTE — ED Provider Triage Note (Signed)
Emergency Medicine Provider Triage Evaluation Note  Audrey [name] , a 77 y.o. female  was evaluated in triage.  Pt complains of weakness since Sunday.  Patient went to urgent care on Monday and was told she was dehydrated and needed fluids however patient never followed up at the ER to receive fluids.  Daughter was present to provide history and states that patient is appear more confused over the past day in which patient agrees in which she feels that she has slowed thinking that is new.  Patient denies any neck rigidity, fevers, abdominal pain, nausea/vomiting, diarrhea, change in sensation/motor skills, chest pain, shortness of breath.  Review of Systems  Positive: See HPI Negative: See HPI  Physical Exam  BP 122/66 (BP Location: Right Arm)   Pulse 71   Temp 98.5 F (36.9 C) (Oral)   Resp 18   Ht 5\' 4"  (1.626 m)   Wt 47.5 kg   SpO2 100%   BMI 17.99 kg/m  Gen:   Awake, no distress   Resp:  Normal effort  MSK:   Moves extremities without difficulty  Other:  AOx3, lungs clear to auscultation bilaterally, abdomen nontender without peritoneal signs  Medical Decision Making  Medically screening exam initiated at 8:59 PM.  Appropriate orders placed.  Audrey [name] was informed that the remainder of the evaluation will be completed by another provider, this initial triage assessment does not replace that evaluation, and the importance of remaining in the ED until their evaluation is complete.  Workup initiated, patient stable at this time   Remi Deter 10/07/22 2100

## 2022-10-07 NOTE — Telephone Encounter (Signed)
Prior Authorization initiated for [facility] via [employer] Case ID: [id number]  Status: PENDING

## 2022-10-07 NOTE — ED Provider Notes (Signed)
[place] EMERGENCY DEPARTMENT AT [facility] Provider Note   CSN: [id number] Arrival date & time: 10/07/22  1644     History  Chief Complaint  Patient presents with   Weakness    Audrey [name] is a 77 y.o. female.  77 year old female with a history of insulin-dependent diabetes and hyperlipidemia who presents emergency department with nausea and concerns for dehydration.  Has had generalized weakness and nausea since Sunday.  Has been eating less since then.  Went to urgent care on Monday and was given some Zofran which improved her nausea but was not given a prescription for this.  Had a urinalysis that was not consistent with UTI.  Says that the generalized weakness is persisted and nausea as well. No abdominal pain, diarrhea, constipation, dysuria, or frequency.  No abdominal surgeries. Was told to come into the ER for fluids.  History obtained per the patient's son.       Home Medications Prior to Admission medications   Medication Sig Start Date End Date Taking? Authorizing Provider  ondansetron (ZOFRAN-ODT) 4 MG disintegrating tablet Take 1 tablet (4 mg total) by mouth every 8 (eight) hours as needed for nausea or vomiting. 10/07/22  Yes Rondel Baton, MD  ALPRAZolam Prudy Feeler) 0.5 MG tablet Take 0.5 mg by mouth every 6 (six) hours as needed for anxiety.    [provider]  aspirin 81 MG tablet Take 81 mg by mouth every other day.     [provider]  Calcium Carb-Cholecalciferol 600-800 MG-UNIT TABS Take by mouth.    [provider]  CINNAMON PO Take 1,000 mg by mouth.    [provider]  Continuous Blood Gluc Sensor (DEXCOM G6 SENSOR) MISC 1 Device by Does not apply route as directed. 05/20/22   Shamleffer, Konrad Dolores, MD  Continuous Blood Gluc Transmit (DEXCOM G6 TRANSMITTER) MISC 1 Device by Does not apply route as directed. 05/20/22   Shamleffer, Konrad Dolores, MD  Continuous Glucose Sensor (FREESTYLE LIBRE 2 SENSOR)  MISC Change every 14 days 09/20/22   Shamleffer, Konrad Dolores, MD  diazepam (VALIUM) 5 MG tablet Take 5 mg by mouth every 6 (six) hours as needed for anxiety.    [provider]  diphenhydrAMINE (BENADRYL) 25 mg capsule Take 25 mg by mouth every 6 (six) hours as needed.    [provider]  glucose blood (ONETOUCH ULTRA) test strip 1 each by Other route in the morning, at noon, in the evening, and at bedtime. E11.9 07/24/19   Romero Belling, MD  insulin aspart (NOVOLOG FLEXPEN) 100 UNIT/ML FlexPen Max daily 30 units 07/22/22   Shamleffer, Konrad Dolores, MD  insulin degludec (TRESIBA FLEXTOUCH) 100 UNIT/ML FlexTouch Pen Inject 10 Units into the skin daily. Patient taking differently: Inject 8 Units into the skin daily. 06/14/22   Shamleffer, Konrad Dolores, MD  Insulin Disposable Pump (OMNIPOD 5 G6 INTRO, GEN 5,) KIT 1 Device by Does not apply route every other day. Patient not taking: Reported on 07/20/2022 04/28/22   Shamleffer, Konrad Dolores, MD  Insulin Disposable Pump (OMNIPOD 5 G6 PODS, GEN 5,) MISC 1 Device by Does not apply route every 3 (three) days. Patient not taking: Reported on 07/20/2022 04/28/22   Shamleffer, Konrad Dolores, MD  Insulin Disposable Pump (OMNIPOD CLASSIC PODS, GEN 3,) MISC 1 Device by Does not apply route every 3 (three) days. Patient not taking: Reported on 07/20/2022 05/04/22   Shamleffer, Konrad Dolores, MD  Insulin Pen Needle 32G X 4 MM MISC  Use 4x a day 05/05/22   Carlus Pavlov, MD  Lancets [facility] ULTRASOFT) lancets 1 each by Other route 4 (four) times daily. E11.9 07/24/19   Romero Belling, MD  multivitamin-lutein [facility]) CAPS capsule Take 1 capsule by mouth daily.    [provider]  pravastatin (PRAVACHOL) 40 MG tablet Take 80 mg by mouth daily.    [provider]  TURMERIC PO Take by mouth.    [provider]  vitamin C (ASCORBIC ACID) 500 MG tablet Take 500 mg by mouth daily.    [provider]       Allergies    Morphine and codeine and Januvia [sitagliptin]    Review of Systems   Review of Systems  Physical Exam Updated Vital Signs BP (!) 153/67   Pulse 63   Temp 97.8 F (36.6 C) (Oral)   Resp 18   Ht 5\' 4"  (1.626 m)   Wt 47.5 kg   SpO2 100%   BMI 17.99 kg/m  Physical Exam Vitals and nursing note reviewed.  Constitutional:      General: She is not in acute distress.    Appearance: She is well-developed.  HENT:     Head: Normocephalic and atraumatic.     Right Ear: External ear normal.     Left Ear: External ear normal.     Nose: Nose normal.  Eyes:     Extraocular Movements: Extraocular movements intact.     Conjunctiva/sclera: Conjunctivae normal.     Pupils: Pupils are equal, round, and reactive to light.  Cardiovascular:     Rate and Rhythm: Normal rate and regular rhythm.     Heart sounds: No murmur heard. Pulmonary:     Effort: Pulmonary effort is normal. No respiratory distress.     Breath sounds: Normal breath sounds.  Abdominal:     General: Abdomen is flat. There is no distension.     Palpations: Abdomen is soft. There is no mass.     Tenderness: There is no abdominal tenderness. There is no guarding.  Musculoskeletal:     Cervical back: Normal range of motion and neck supple.     Right lower leg: No edema.     Left lower leg: No edema.  Skin:    General: Skin is warm and dry.  Neurological:     Mental Status: She is alert and oriented to person, place, and time. Mental status is at baseline.  Psychiatric:        Mood and Affect: Mood normal.     ED Results / Procedures / Treatments   Labs (all labs ordered are listed, but only abnormal results are displayed) Labs Reviewed  CBC WITH DIFFERENTIAL/PLATELET - Abnormal; Notable for the following components:      Result Value   RBC 3.53 (*)    Hemoglobin 10.7 (*)    HCT 33.3 (*)    All other components within normal limits  BASIC METABOLIC PANEL - Abnormal; Notable for the following  components:   Glucose, Bld 315 (*)    All other components within normal limits  URINALYSIS, ROUTINE W REFLEX MICROSCOPIC - Abnormal; Notable for the following components:   Glucose, UA >=500 (*)    Ketones, ur 5 (*)    Bacteria, UA RARE (*)    All other components within normal limits  CBG MONITORING, ED - Abnormal; Notable for the following components:   Glucose-Capillary 320 (*)    All other components within normal limits  CBG MONITORING, ED -  Abnormal; Notable for the following components:   Glucose-Capillary 226 (*)    All other components within normal limits  SARS CORONAVIRUS 2 BY RT PCR    EKG EKG Interpretation Date/Time:  Thursday October 07 2022 22:05:02 EDT Ventricular Rate:  70 PR Interval:  158 QRS Duration:  72 QT Interval:  380 QTC Calculation: 410 R Axis:   98  Text Interpretation:  Suspect arm lead reversal, interpretation assumes no reversal Normal sinus rhythm Right atrial enlargement Rightward axis Anterior infarct , age undetermined Abnormal ECG Confirmed by Vonita Moss [phone]) on 10/07/2022 10:32:46 PM  Radiology DG Chest 2 View  Result Date: 10/07/2022 CLINICAL DATA:  Weakness. EXAM: CHEST - 2 VIEW COMPARISON:  X-ray 10/05/2018 FINDINGS: The heart size and mediastinal contours are within normal limits. Both lungs are clear. Hyperinflation. No consolidation, pneumothorax or effusion. No edema. The visualized skeletal structures are unremarkable. IMPRESSION: No acute cardiopulmonary disease. Electronically Signed   By: Karen Kays M.D.   On: 10/07/2022 21:21   CT Head Wo Contrast  Result Date: 10/07/2022 CLINICAL DATA:  Weakness, confusion, dizziness EXAM: CT HEAD WITHOUT CONTRAST TECHNIQUE: Contiguous axial images were obtained from the base of the skull through the vertex without intravenous contrast. RADIATION DOSE REDUCTION: This exam was performed according to the departmental dose-optimization program which includes automated exposure control,  adjustment of the mA and/or kV according to patient size and/or use of iterative reconstruction technique. COMPARISON:  None Available. FINDINGS: Brain: Hypodensity within the right parietal subcortical white matter image 19/2 consistent with age-indeterminate small vessel ischemic change. No other signs of acute infarct or hemorrhage. Lateral ventricles and midline structures are unremarkable. No acute extra-axial fluid collections. No mass effect. Vascular: No hyperdense vessel or unexpected calcification. Skull: Normal. Negative for fracture or focal lesion. Sinuses/Orbits: Mild mucosal thickening within the right sphenoid sinus and right posterior ethmoid air cells. Other: None. IMPRESSION: 1. Age-indeterminate small vessel ischemic change within the right parietal subcortical white matter. 2. No evidence of acute hemorrhage. 3. Minimal right-sided paranasal sinus disease. Electronically Signed   By: Sharlet Salina M.D.   On: 10/07/2022 21:15    Procedures Procedures    Medications Ordered in ED Medications  lactated ringers bolus 1,000 mL (0 mLs Intravenous Stopped 10/08/22 0030)  insulin aspart (novoLOG) injection 5 Units (5 Units Subcutaneous Given 10/07/22 2315)  ondansetron (ZOFRAN) injection 4 mg (4 mg Intravenous Given 10/07/22 2307)    ED Course/ Medical Decision Making/ A&P                                 Medical Decision Making Risk Prescription drug management.   Audrey [name] is a 77 y.o. female with comorbidities that complicate the patient evaluation including insulin-dependent diabetes and hyperlipidemia who presents to the emergency department with nausea and generalized weakness  Initial Ddx:  Viral illness, gastritis, dehydration, hyperglycemia, bowel obstruction  MDM/Course:  Patient presents to the emergency department with several days of nausea and generalized weakness.  Was told to come into the emergency department for IV fluids.  On arrival was hyperglycemic but  not in DKA.  Was given some IV fluids and insulin for blood sugar and was feeling better upon reevaluation.  No significant abdominal tenderness palpation or abdominal pain that would warrant CT imaging today.  Not having any chest pain or shortness of breath or dizziness that would be concerning for MI.  Patient was given a  prescription of Zofran at home and instructed to follow-up with her primary doctor in several days regarding her symptoms.  Return precautions discussed prior to discharge.  Of note, did have a CT of the head that was ordered from triage.  Unclear exactly why this was ordered since she is complaining of a headache and is not having any other neurologic complaints.  Was initially talking about some dizziness to them but after talking to her appears to be more orthostasis and is likely related to her dehydration and nausea and decreased p.o. intake rather than vertigo.  This patient presents to the ED for concern of complaints listed in HPI, this involves an extensive number of treatment options, and is a complaint that carries with it a high risk of complications and morbidity. Disposition including potential need for admission considered.   Dispo: DC Home. Return precautions discussed including, but not limited to, those listed in the AVS. Allowed pt time to ask questions which were answered fully prior to dc.  Additional history obtained from son Records reviewed Outpatient Clinic Notes The following labs were independently interpreted: Chemistry and show  hyperglycemia without DKA I independently reviewed the following imaging with scope of interpretation limited to determining acute life threatening conditions related to emergency care: CT Head and agree with the radiologist interpretation with the following exceptions: none I personally reviewed and interpreted cardiac monitoring: normal sinus rhythm  I personally reviewed and interpreted the pt's EKG: see above for  interpretation  I have reviewed the patients home medications and made adjustments as needed Social Determinants of health:  Elderly         Final Clinical Impression(s) / ED Diagnoses Final diagnoses:  Generalized weakness  Nausea  Hyperglycemia    Rx / DC Orders ED Discharge Orders          Ordered    ondansetron (ZOFRAN-ODT) 4 MG disintegrating tablet  Every 8 hours PRN        10/07/22 2332              Rondel Baton, MD 10/08/22 1215

## 2022-10-08 DIAGNOSIS — R531 Weakness: Secondary | ICD-10-CM | POA: Diagnosis not present

## 2022-10-08 LAB — CBG MONITORING, ED: Glucose-Capillary: 226 mg/dL — ABNORMAL HIGH (ref 70–99)

## 2022-10-08 NOTE — Telephone Encounter (Signed)
Prior Auth APPROVED PA# [id number] Valid: 10/07/22-10/07/23

## 2022-10-08 NOTE — Telephone Encounter (Addendum)
Pt ready for scheduling on or after 10/28/22 (Office visit scheduled with Dr. Lonzo Cloud on 11/22/22)  Out-of-pocket cost due at time of visit: $345  Primary: [employer] Essential PPO [employer] co-insurance: 20% (approximately $320) Admin fee co-insurance: 20% (approximately $25)  Deductible: does not apply  Prior Auth APPROVED PA# [id number] Valid: 10/07/22-10/07/23  Secondary: N/A Prolia co-insurance:  Admin fee co-insurance:  Deductible:  Prior Auth:  PA# Valid:   ** This summary of benefits is an estimation of the patient's out-of-pocket cost. Exact cost may vary based on individual plan coverage.

## 2022-10-08 NOTE — ED Notes (Signed)
Spoke with pharmacy re: pts question about taking her Evaristo Bury 8 units as normally scheduled when she gets home, pt reports she normally takes the med at 20:00, per pharmacy recommendation the pt should take the medication at this time as her glucose levels are high

## 2022-10-11 MED ORDER — ALENDRONATE SODIUM 70 MG PO TABS: 70.0000 mg | ORAL_TABLET | ORAL | 3 refills | Status: DC

## 2022-10-11 NOTE — Telephone Encounter (Signed)
Patient advised and will pick up medication  

## 2022-10-11 NOTE — Addendum Note (Signed)
Addended by: Scarlette Shorts on: 10/11/2022 11:00 AM   Modules accepted: Orders

## 2022-10-12 ENCOUNTER — Ambulatory Visit (INDEPENDENT_AMBULATORY_CARE_PROVIDER_SITE_OTHER): Payer: Medicare HMO | Admitting: Family Medicine

## 2022-10-12 ENCOUNTER — Encounter: Payer: Self-pay | Admitting: Family Medicine

## 2022-10-12 VITALS — BP 98/56 | HR 71 | Temp 98.4°F | Ht 64.0 in | Wt 102.8 lb

## 2022-10-12 DIAGNOSIS — W19XXXA Unspecified fall, initial encounter: Secondary | ICD-10-CM | POA: Diagnosis not present

## 2022-10-12 DIAGNOSIS — E109 Type 1 diabetes mellitus without complications: Secondary | ICD-10-CM | POA: Diagnosis not present

## 2022-10-12 DIAGNOSIS — R2681 Unsteadiness on feet: Secondary | ICD-10-CM | POA: Diagnosis not present

## 2022-10-12 LAB — CMP14+EGFR
ALT: 15 IU/L (ref 0–32)
AST: 21 IU/L (ref 0–40)
Albumin: 4.4 g/dL (ref 3.8–4.8)
Alkaline Phosphatase: 54 IU/L (ref 44–121)
BUN/Creatinine Ratio: 23 (ref 12–28)
BUN: 22 mg/dL (ref 8–27)
Bilirubin Total: 0.4 mg/dL (ref 0.0–1.2)
CO2: 27 mmol/L (ref 20–29)
Calcium: 10.6 mg/dL — ABNORMAL HIGH (ref 8.7–10.3)
Chloride: 100 mmol/L (ref 96–106)
Creatinine, Ser: 0.96 mg/dL (ref 0.57–1.00)
Globulin, Total: 2.4 g/dL (ref 1.5–4.5)
Glucose: 77 mg/dL (ref 70–99)
Potassium: 4.3 mmol/L (ref 3.5–5.2)
Sodium: 141 mmol/L (ref 134–144)
Total Protein: 6.8 g/dL (ref 6.0–8.5)
eGFR: 61 mL/min/{1.73_m2} (ref >59)

## 2022-10-12 LAB — CBC WITH DIFFERENTIAL/PLATELET
Basophils Absolute: 0 10*3/uL (ref 0.0–0.2)
Basos: 1 %
EOS (ABSOLUTE): 0.1 10*3/uL (ref 0.0–0.4)
Eos: 2 %
Hematocrit: 35.1 % (ref 34.0–46.6)
Hemoglobin: 11.6 g/dL (ref 11.1–15.9)
Immature Grans (Abs): 0 10*3/uL (ref 0.0–0.1)
Immature Granulocytes: 0 %
Lymphocytes Absolute: 0.9 10*3/uL (ref 0.7–3.1)
Lymphs: 20 %
MCH: 30.5 pg (ref 26.6–33.0)
MCHC: 33 g/dL (ref 31.5–35.7)
MCV: 92 fL (ref 79–97)
Monocytes Absolute: 0.4 10*3/uL (ref 0.1–0.9)
Monocytes: 8 %
Neutrophils Absolute: 3.3 10*3/uL (ref 1.4–7.0)
Neutrophils: 69 %
Platelets: 259 10*3/uL (ref 150–450)
RBC: 3.8 x10E6/uL (ref 3.77–5.28)
RDW: 13 % (ref 11.7–15.4)
WBC: 4.7 10*3/uL (ref 3.4–10.8)

## 2022-10-12 LAB — URINALYSIS
Bilirubin, UA: NEGATIVE
Glucose, UA: NEGATIVE
Leukocytes,UA: NEGATIVE
Nitrite, UA: NEGATIVE
Protein,UA: NEGATIVE
RBC, UA: NEGATIVE
Specific Gravity, UA: 1.025 (ref 1.005–1.030)
Urobilinogen, Ur: 0.2 mg/dL (ref 0.2–1.0)
pH, UA: 5.5 (ref 5.0–7.5)

## 2022-10-12 LAB — TSH+FREE T4

## 2022-10-12 LAB — LIPID PANEL
Chol/HDL Ratio: 3.1 ratio (ref 0.0–4.4)
Cholesterol, Total: 167 mg/dL (ref 100–199)
HDL: 54 mg/dL (ref >39)
LDL Chol Calc (NIH): 90 mg/dL (ref 0–99)
Triglycerides: 129 mg/dL (ref 0–149)
VLDL Cholesterol Cal: 23 mg/dL (ref 5–40)

## 2022-10-12 LAB — C-PEPTIDE

## 2022-10-12 MED ORDER — LISINOPRIL 5 MG PO TABS
5.0000 mg | ORAL_TABLET | ORAL | Status: DC
Start: 2022-10-12 — End: 2022-10-27

## 2022-10-12 MED ORDER — INSULIN PEN NEEDLE 32G X 4 MM MISC
3 refills | Status: AC
Start: 2022-10-12 — End: ?

## 2022-10-12 NOTE — Progress Notes (Signed)
Subjective:  Patient ID: Audrey [name], female    DOB: February 04, 1946  Age: 77 y.o. MRN: [id number]  CC: Establish Care and Dizziness   HPI Audrey [name] presents for new patien evaluation for dizziness. Lost balance yesterday and fell. Didn't feel bad. She denies loss of balance, feeling woozy, dizzy or weak. No other falls recently. She had just walked an extended distance around her house with a one gallon pitcher of water when she fell. She is aware of the symptoms of low glucose and denies having felt them when she fell. She did not lose consciousness. Pt. & brother report that her glucose goes to low frequently and makes her feel bad. It has also gone into the 400+ range at times. She gives herself mealtime novolog based on SSI below:    She taks [place] 8 units a day. She does not use the diposable pumps that were prescribed. She has a Therapist, art monitor she uses frequently. (Wears at all times)     10/12/2022    8:41 AM 10/02/2015   11:13 AM  Depression screen PHQ 2/9  Decreased Interest 1 0  Down, Depressed, Hopeless 0 1  PHQ - 2 Score 1 1  Altered sleeping 2   Tired, decreased energy 3   Change in appetite 1   Feeling bad or failure about yourself  0   Trouble concentrating 0   Moving slowly or fidgety/restless 2   Suicidal thoughts 0   PHQ-9 Score 9   Difficult doing work/chores Somewhat difficult    Denies depression, but taking sertraline as prescribed.  History Audrey [name] has a past medical history of Diabetes mellitus (HCC), Hyperlipidemia, Osteopenia, Pericardial effusion, PONV (postoperative nausea and vomiting), and Seasonal allergies.   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   Her family history includes Cancer in her sister; Diabetes in her brother; Heart attack in her father; Heart disease in her brother.She reports that she has never smoked. She has never used smokeless  tobacco. She reports that she does not drink alcohol and does not use drugs.    ROS Review of Systems  Constitutional:  Positive for activity change (decreased walking), appetite change (decreased recently) and fatigue.  HENT: Negative.    Eyes:  Negative for visual disturbance.  Respiratory:  Negative for shortness of breath.   Cardiovascular:  Negative for chest pain.  Gastrointestinal:  Negative for abdominal pain.  Musculoskeletal:  Positive for gait problem (unsteady on her feet). Negative for arthralgias.  Neurological:  Positive for dizziness.  Psychiatric/Behavioral:  Positive for confusion (reported by her brother).     Objective:  BP (!) 98/56   Pulse 71   Temp 98.4 F (36.9 C)   Ht 5\' 4"  (1.626 m)   Wt 102 lb 12.8 oz (46.6 kg)   SpO2 97%   BMI 17.65 kg/m   BP Readings from Last 3 Encounters:  10/12/22 (!) 98/56  10/08/22 (!) 153/67  07/20/22 118/70    Wt Readings from Last 3 Encounters:  10/12/22 102 lb 12.8 oz (46.6 kg)  10/07/22 104 lb 12.8 oz (47.5 kg)  07/20/22 107 lb (48.5 kg)     Physical Exam Constitutional:      General: She is not in acute distress.    Appearance: She is well-developed. She is not toxic-appearing. Ill appearance: frail. HENT:     Head: Normocephalic and atraumatic.  Cardiovascular:     Rate and  Rhythm: Normal rate and regular rhythm.  Pulmonary:     Breath sounds: Normal breath sounds.  Abdominal:     General: There is no distension.     Tenderness: There is no abdominal tenderness.  Musculoskeletal:        General: No swelling, tenderness, deformity or signs of injury. Normal range of motion.  Skin:    General: Skin is warm and dry.     Findings: Lesion (3 cm skin tear on right forearm) present.  Neurological:     Mental Status: She is alert and oriented to person, place, and time.     Motor: Weakness (nonfocal) present.     Gait: Gait abnormal (slow, somewhat unsteady).  Psychiatric:        Behavior: Behavior  normal.       Assessment & Plan:   Audrey [name] was seen today for establish care and dizziness.  Diagnoses and all orders for this visit:  Fall, initial encounter -     Ambulatory referral to Physical Therapy -     Urinalysis -     Microalbumin / creatinine urine ratio  Type 1 diabetes mellitus without complications (HCC) -     C-peptide -     TSH + free T4 -     CBC with Differential/Platelet -     CMP14+EGFR -     Lipid panel -     Insulin Pen Needle 32G X 4 MM MISC; Use 4x a day -     lisinopril (ZESTRIL) 5 MG tablet; Take 1 tablet (5 mg total) by mouth every other day. -     Urinalysis -     Microalbumin / creatinine urine ratio  Unsteady gait -     Ambulatory referral to Physical Therapy       I have discontinued Audrey [name]. Audrey [name]'s diphenhydrAMINE, diazepam, ALPRAZolam, CINNAMON PO, OneTouch Ultra, Omnipod 5 G6 Pods (Gen 5), Omnipod 5 G6 Intro (Gen 5), Omnipod Classic Pods (Gen 3), Dexcom G6 Transmitter, and Dexcom G6 Sensor. I have also changed her lisinopril. Additionally, I am having her maintain her pravastatin, aspirin, Calcium Carb-Cholecalciferol, TURMERIC PO, multivitamin-lutein, ascorbic acid, onetouch ultrasoft, Tresiba FlexTouch, NovoLOG FlexPen, [employer] 2 Sensor, ondansetron, alendronate, Magnesium, Vitamin D-3, meloxicam, loratadine, Melatonin, sertraline, and Insulin Pen Needle.  Allergies as of 10/12/2022       Reactions   Morphine And Codeine    Januvia [sitagliptin]         Medication List        Accurate as of October 12, 2022  9:24 AM. If you have any questions, ask your nurse or doctor.          STOP taking these medications    ALPRAZolam 0.5 MG tablet Commonly known as: XANAX Stopped by: Tamir Wallman   CINNAMON PO Stopped by: Broadus John Alashia Brownfield   Dexcom G6 Transmitter Misc Stopped by: Dorion Petillo   diazepam 5 MG tablet Commonly known as: VALIUM Stopped by: Broadus John Vici Novick   diphenhydrAMINE 25 mg capsule Commonly known as:  BENADRYL Stopped by: Kirin Brandenburger   Omnipod 5 G6 Intro (Gen 5) Kit Stopped by: Saraiyah Hemminger   Omnipod 5 G6 Pods (Gen 5) Misc Stopped by: Angely Dietz   Omnipod Classic Pods (Gen 3) Misc Stopped by: Lavarius Doughten   OneTouch Ultra test strip Generic drug: glucose blood Stopped by: Sherra Kimmons       TAKE these medications    alendronate 70 MG tablet Commonly known as: FOSAMAX Take 1 tablet (70  mg total) by mouth every 7 (seven) days. Take with a full glass of water on an empty stomach.   Allergy Relief 10 MG tablet Generic drug: loratadine Take 10 mg by mouth daily.   ascorbic acid 500 MG tablet Commonly known as: VITAMIN C Take 500 mg by mouth daily.   aspirin 81 MG tablet Take 81 mg by mouth every other day.   Calcium Carb-Cholecalciferol 600-800 MG-UNIT Tabs Take by mouth.   FreeStyle Libre 2 Sensor Misc Change every 14 days What changed: Another medication with the same name was removed. Continue taking this medication, and follow the directions you see here. Changed by: Ellyce Lafevers   Insulin Pen Needle 32G X 4 MM Misc Use 4x a day   lisinopril 5 MG tablet Commonly known as: ZESTRIL Take 1 tablet (5 mg total) by mouth every other day. What changed: when to take this Changed by: Broadus John Aya Geisel   Magnesium 250 MG Tabs Take 250 mg by mouth daily.   Melatonin 10 MG Tabs Take 10 mg by mouth at bedtime.   meloxicam 15 MG tablet Commonly known as: MOBIC Take 15 mg by mouth daily.   multivitamin-lutein Caps capsule Take 1 capsule by mouth daily.   NovoLOG FlexPen 100 UNIT/ML FlexPen Generic drug: insulin aspart Max daily 30 units   ondansetron 4 MG disintegrating tablet Commonly known as: ZOFRAN-ODT Take 1 tablet (4 mg total) by mouth every 8 (eight) hours as needed for nausea or vomiting.   onetouch ultrasoft lancets 1 each by Other route 4 (four) times daily. E11.9   pravastatin 40 MG tablet Commonly known as: PRAVACHOL Take 80 mg by  mouth daily.   sertraline 50 MG tablet Commonly known as: ZOLOFT Take 50 mg by mouth daily.   Evaristo Bury FlexTouch 100 UNIT/ML FlexTouch Pen Generic drug: insulin degludec Inject 10 Units into the skin daily. What changed: how much to take   TURMERIC PO Take by mouth.   Vitamin D-3 125 MCG (5000 UT) Tabs Take 125 mcg by mouth daily.       Pt. Is traansferring DM care from endocrine to here due to travel distance. Has SSI. Continue it for now and record QID glucose.   Follow-up: Return in about 2 weeks (around 10/26/2022) for diabetes.  Mechele Claude, M.D.

## 2022-10-20 ENCOUNTER — Other Ambulatory Visit: Payer: Self-pay

## 2022-10-20 ENCOUNTER — Ambulatory Visit: Payer: Medicare HMO | Attending: Family Medicine

## 2022-10-20 DIAGNOSIS — Z9181 History of falling: Secondary | ICD-10-CM | POA: Insufficient documentation

## 2022-10-20 DIAGNOSIS — M6281 Muscle weakness (generalized): Secondary | ICD-10-CM | POA: Insufficient documentation

## 2022-10-20 DIAGNOSIS — R2681 Unsteadiness on feet: Secondary | ICD-10-CM | POA: Diagnosis not present

## 2022-10-20 NOTE — Therapy (Signed)
OUTPATIENT PHYSICAL THERAPY LOWER EXTREMITY EVALUATION   Patient Name: Audrey [name] MRN: [id number] DOB:09-Sep-1945, 77 y.o., female Today's Date: 10/20/2022  END OF SESSION:  PT End of Session - 10/20/22 1104     Visit Number 1    Number of Visits 8    Date for PT Re-Evaluation 12/03/22    PT Start Time 1107    PT Stop Time 1143    PT Time Calculation (min) 36 min    Activity Tolerance Patient tolerated treatment well    Behavior During Therapy [facility] for tasks assessed/performed             Past Medical History:  Diagnosis Date   Diabetes mellitus (HCC)    Hyperlipidemia    Osteopenia    2002 T-Score -2   Pericardial effusion    PONV (postoperative nausea and vomiting)    Seasonal allergies    Past Surgical History:  Procedure Laterality Date   rectal polyps removed     right wrist fx     SUBXYPHOID PERICARDIAL WINDOW  02/18/2012   Procedure: SUBXYPHOID PERICARDIAL WINDOW;  Surgeon: Kerin Perna, MD;  Location: [facility] OR;  Service: Thoracic;  Laterality: N/A;  TEE   Patient Active Problem List   Diagnosis Date Noted   Type 1 diabetes mellitus without complications (HCC) 04/02/2020   Osteoporosis 03/27/2018   Ingrowing toenail 10/19/2017   Left elbow pain 11/15/2013   S/P pericardial surgery 02/20/2012   Second degree AV block, Mobitz type I 02/18/2012   Pericardial effusion 02/16/2012   Seasonal allergies    Osteopenia    REFERRING PROVIDER: Mechele Claude, MD   REFERRING DIAG: Fall, initial encounter; Unsteady gait   THERAPY DIAG:  Muscle weakness (generalized)  History of falling  Rationale for Evaluation and Treatment: Rehabilitation  ONSET DATE: 2-3 weeks ago  SUBJECTIVE:   SUBJECTIVE STATEMENT: Patient reports that she fell about 2-3 weeks ago while she was walking outside to give her son a gallon of water. She notes that this was the first time she had fallen. She began using a cane a few weeks ago. She feels more secure when using her cane,  but she does not use it when walking around her house.   PERTINENT HISTORY: Diabetes type 1, osteoporosis, and allergies PAIN:  Are you having pain? Yes: NPRS scale: 1-2/10 Pain location: left leg Pain description: primarily sore Aggravating factors: standing (about 15 minutes)  Relieving factors: rest  PRECAUTIONS: Fall  RED FLAGS: None   WEIGHT BEARING RESTRICTIONS: No  FALLS:  Has patient fallen in last 6 months? Yes. Number of falls 1  LIVING ENVIRONMENT: Lives with: lives with their family Lives in: House/apartment Stairs: Yes: Internal: 1 flight steps; can reach both and External: "a few" steps; none Has following equipment at home: Single point cane  OCCUPATION: retired  PLOF: Independent  PATIENT GOALS: be able to walk safely  NEXT MD VISIT: 10/26/22  OBJECTIVE:   COGNITION: Overall cognitive status: Within functional limits for tasks assessed     SENSATION: Patient reports no numbness or tingling  LOWER EXTREMITY ROM: WFL for activities assessed  LOWER EXTREMITY MMT:  MMT Right eval Left eval  Hip flexion 4/5 4-/5; left knee pain   Hip extension    Hip abduction    Hip adduction    Hip internal rotation    Hip external rotation    Knee flexion 3+/5 4-/5  Knee extension 4+/5 4/5  Ankle dorsiflexion 4/5 4/5  Ankle plantarflexion  Ankle inversion    Ankle eversion     (Blank rows = not tested)  FUNCTIONAL TESTS:  5 times sit to stand: 17.42 seconds without upper extremity support Timed up and go (TUG): 18.45 seconds without an assistive device  BALANCE:  Narrow BOS, firm surface, eyes open: 30 seconds   Narrow BOS, firm surface, eyes closed: 30 seconds  Tandem, firm surface, eyes open: 3 seconds bilaterally   GAIT: Assistive device utilized: Single point cane Level of assistance: Modified independence Comments: decreased gait speed and knee flexion in stance phase bilaterally   TODAY'S TREATMENT:                                                                                                                               DATE:     PATIENT EDUCATION:  Education details: POC, prognosis, objective findings, and goals for therapy Person educated: Patient Education method: Explanation Education comprehension: verbalized understanding  HOME EXERCISE PROGRAM:   ASSESSMENT:  CLINICAL IMPRESSION: Patient is a 77 y.o. female who was seen today for physical therapy evaluation and treatment for a history of falling. She is at an elevated risk of falling as evidenced by her gait deviations, objective measures, and her history of falling. Recommend that she continue with skilled physical therapy to address her remaining impairments to maximize her safety and functional mobility.   OBJECTIVE IMPAIRMENTS: Abnormal gait, decreased activity tolerance, decreased balance, decreased mobility, difficulty walking, decreased strength, and pain.   ACTIVITY LIMITATIONS: carrying, standing, stairs, and locomotion level  PARTICIPATION LIMITATIONS: meal prep, cleaning, laundry, and shopping  PERSONAL FACTORS: Age and 3+ comorbidities: Diabetes type 1, osteoporosis, and allergies  are also affecting patient's functional outcome.   REHAB POTENTIAL: Good  CLINICAL DECISION MAKING: Evolving/moderate complexity  EVALUATION COMPLEXITY: Moderate   GOALS: Goals reviewed with patient? Yes  LONG TERM GOALS: Target date: 11/17/22  Patient will be independent with her HEP.  Baseline:  Goal status: INITIAL  2.  Patient will improve her five time sit to stand time to 12 seconds or less for a reduced fall risk. Baseline:  Goal status: INITIAL  3.  Patient will improve her timed up and go time to 12 seconds or less for reduced fall risk.  Baseline:  Goal status: INITIAL  4.  Patient will report being able to stand for at least 25 minutes for improved function with her household activities.  Baseline:  Goal status: INITIAL  PLAN:  PT  FREQUENCY: 2x/week  PT DURATION: 4 weeks  PLANNED INTERVENTIONS: Therapeutic exercises, Therapeutic activity, Neuromuscular re-education, Balance training, Gait training, Patient/Family education, Self Care, Joint mobilization, Stair training, and Re-evaluation  PLAN FOR NEXT SESSION: nustep, lower extremity strengthening, and balance interventions   Granville Lewis, PT 10/20/2022, 12:38 PM

## 2022-10-22 ENCOUNTER — Ambulatory Visit: Payer: Medicare HMO

## 2022-10-22 DIAGNOSIS — R2681 Unsteadiness on feet: Secondary | ICD-10-CM | POA: Diagnosis not present

## 2022-10-22 DIAGNOSIS — Z9181 History of falling: Secondary | ICD-10-CM | POA: Diagnosis not present

## 2022-10-22 DIAGNOSIS — M6281 Muscle weakness (generalized): Secondary | ICD-10-CM

## 2022-10-22 NOTE — Therapy (Signed)
OUTPATIENT PHYSICAL THERAPY LOWER EXTREMITY TREATMENT   Patient Name: Audrey [name] MRN: [id number] DOB:[birth year], 77 y.o., female Today's Date: 10/22/2022  END OF SESSION:  PT End of Session - 10/22/22 1022     Visit Number 2    Number of Visits 8    Date for PT Re-Evaluation 12/03/22    PT Start Time 1015    PT Stop Time 1100    PT Time Calculation (min) 45 min    Activity Tolerance Patient tolerated treatment well    Behavior During Therapy [facility] for tasks assessed/performed             Past Medical History:  Diagnosis Date   Diabetes mellitus (HCC)    Hyperlipidemia    Osteopenia    2002 T-Score -2   Pericardial effusion    PONV (postoperative nausea and vomiting)    Seasonal allergies    Past Surgical History:  Procedure Laterality Date   rectal polyps removed     right wrist fx     SUBXYPHOID PERICARDIAL WINDOW  02/18/2012   Procedure: SUBXYPHOID PERICARDIAL WINDOW;  Surgeon: Kerin Perna, MD;  Location: [facility] OR;  Service: Thoracic;  Laterality: N/A;  TEE   Patient Active Problem List   Diagnosis Date Noted   Type 1 diabetes mellitus without complications (HCC) 04/02/2020   Osteoporosis 03/27/2018   Ingrowing toenail 10/19/2017   Left elbow pain 11/15/2013   S/P pericardial surgery 02/20/2012   Second degree AV block, Mobitz type I 02/18/2012   Pericardial effusion 02/16/2012   Seasonal allergies    Osteopenia    REFERRING PROVIDER: Mechele Claude, MD   REFERRING DIAG: Fall, initial encounter; Unsteady gait   THERAPY DIAG:  Muscle weakness (generalized)  History of falling  Rationale for Evaluation and Treatment: Rehabilitation  ONSET DATE: 2-3 weeks ago  SUBJECTIVE:   SUBJECTIVE STATEMENT: Pt reports 1-2/10 left knee pain.   PERTINENT HISTORY: Diabetes type 1, osteoporosis, and allergies PAIN:  Are you having pain? Yes: NPRS scale: 1-2/10 Pain location: left leg Pain description: primarily sore Aggravating factors: standing (about  15 minutes)  Relieving factors: rest  PRECAUTIONS: Fall  RED FLAGS: None   WEIGHT BEARING RESTRICTIONS: No  FALLS:  Has patient fallen in last 6 months? Yes. Number of falls 1  LIVING ENVIRONMENT: Lives with: lives with their family Lives in: House/apartment Stairs: Yes: Internal: 1 flight steps; can reach both and External: "a few" steps; none Has following equipment at home: Single point cane  OCCUPATION: retired  PLOF: Independent  PATIENT GOALS: be able to walk safely  NEXT MD VISIT: 10/26/22  OBJECTIVE:   COGNITION: Overall cognitive status: Within functional limits for tasks assessed     SENSATION: Patient reports no numbness or tingling  LOWER EXTREMITY ROM: WFL for activities assessed  LOWER EXTREMITY MMT:  MMT Right eval Left eval  Hip flexion 4/5 4-/5; left knee pain   Hip extension    Hip abduction    Hip adduction    Hip internal rotation    Hip external rotation    Knee flexion 3+/5 4-/5  Knee extension 4+/5 4/5  Ankle dorsiflexion 4/5 4/5  Ankle plantarflexion    Ankle inversion    Ankle eversion     (Blank rows = not tested)  FUNCTIONAL TESTS:  5 times sit to stand: 17.42 seconds without upper extremity support Timed up and go (TUG): 18.45 seconds without an assistive device  BALANCE:  Narrow BOS, firm surface, eyes open: 30 seconds  Narrow BOS, firm surface, eyes closed: 30 seconds  Tandem, firm surface, eyes open: 3 seconds bilaterally   GAIT: Assistive device utilized: Single point cane Level of assistance: Modified independence Comments: decreased gait speed and knee flexion in stance phase bilaterally   TODAY'S TREATMENT:                                                                                                                              DATE:                                     EXERCISE LOG  Exercise Repetitions and Resistance Comments  Nustep  Lvl 2 x 15 mins   Rockerboard 3 mins   Forward Step Ups 4" x 20 reps  bil   LAQs 2# x 20 reps bil   Seated Marches 2# x 20 reps bil   Clams Red x 2 mins   Ball Squeezes 2 mins   Seated Ham Curls    STS     Blank cell = exercise not performed today   PATIENT EDUCATION:  Education details: POC, prognosis, objective findings, and goals for therapy Person educated: Patient Education method: Explanation Education comprehension: verbalized understanding  HOME EXERCISE PROGRAM:   ASSESSMENT:  CLINICAL IMPRESSION: Pt arrives for today's treatment session reporting mild left knee pain.  Pt able to tolerate Nustep for warm up today without complaint of pain or discomfort.  Pt instructed in various standing and seated BLE exercises to increase strength and function.  Pt requiring min cues for proper technique and posture.  Pt denied any pain at completion of today's treatment session, but does endorse mild increase in fatigue.    OBJECTIVE IMPAIRMENTS: Abnormal gait, decreased activity tolerance, decreased balance, decreased mobility, difficulty walking, decreased strength, and pain.   ACTIVITY LIMITATIONS: carrying, standing, stairs, and locomotion level  PARTICIPATION LIMITATIONS: meal prep, cleaning, laundry, and shopping  PERSONAL FACTORS: Age and 3+ comorbidities: Diabetes type 1, osteoporosis, and allergies  are also affecting patient's functional outcome.   REHAB POTENTIAL: Good  CLINICAL DECISION MAKING: Evolving/moderate complexity  EVALUATION COMPLEXITY: Moderate   GOALS: Goals reviewed with patient? Yes  LONG TERM GOALS: Target date: 11/17/22  Patient will be independent with her HEP.  Baseline:  Goal status: INITIAL  2.  Patient will improve her five time sit to stand time to 12 seconds or less for a reduced fall risk. Baseline:  Goal status: INITIAL  3.  Patient will improve her timed up and go time to 12 seconds or less for reduced fall risk.  Baseline:  Goal status: INITIAL  4.  Patient will report being able to stand for at  least 25 minutes for improved function with her household activities.  Baseline:  Goal status: INITIAL  PLAN:  PT FREQUENCY: 2x/week  PT DURATION: 4 weeks  PLANNED INTERVENTIONS: Therapeutic exercises, Therapeutic activity,  Neuromuscular re-education, Balance training, Gait training, Patient/Family education, Self Care, Joint mobilization, Stair training, and Re-evaluation  PLAN FOR NEXT SESSION: nustep, lower extremity strengthening, and balance interventions   Newman Pies, PTA 10/22/2022, 12:39 PM

## 2022-10-26 ENCOUNTER — Encounter: Payer: Self-pay | Admitting: Family Medicine

## 2022-10-26 ENCOUNTER — Ambulatory Visit (INDEPENDENT_AMBULATORY_CARE_PROVIDER_SITE_OTHER): Payer: Medicare HMO | Admitting: Family Medicine

## 2022-10-26 VITALS — BP 100/57 | HR 81 | Temp 98.5°F | Ht 64.0 in | Wt 107.4 lb

## 2022-10-26 DIAGNOSIS — E10649 Type 1 diabetes mellitus with hypoglycemia without coma: Secondary | ICD-10-CM | POA: Diagnosis not present

## 2022-10-26 DIAGNOSIS — E109 Type 1 diabetes mellitus without complications: Secondary | ICD-10-CM | POA: Diagnosis not present

## 2022-10-26 LAB — CBC WITH DIFFERENTIAL/PLATELET
Basophils Absolute: 0.1 10*3/uL (ref 0.0–0.2)
Basos: 1 %
EOS (ABSOLUTE): 0.1 10*3/uL (ref 0.0–0.4)
Eos: 1 %
Hematocrit: 33.1 % — ABNORMAL LOW (ref 34.0–46.6)
Hemoglobin: 10.6 g/dL — ABNORMAL LOW (ref 11.1–15.9)
Immature Grans (Abs): 0 10*3/uL (ref 0.0–0.1)
Immature Granulocytes: 1 %
Lymphocytes Absolute: 0.6 10*3/uL — ABNORMAL LOW (ref 0.7–3.1)
Lymphs: 7 %
MCH: 29.7 pg (ref 26.6–33.0)
MCHC: 32 g/dL (ref 31.5–35.7)
MCV: 93 fL (ref 79–97)
Monocytes Absolute: 0.6 10*3/uL (ref 0.1–0.9)
Monocytes: 7 %
Neutrophils Absolute: 7.1 10*3/uL — ABNORMAL HIGH (ref 1.4–7.0)
Neutrophils: 83 %
Platelets: 244 10*3/uL (ref 150–450)
RBC: 3.57 x10E6/uL — ABNORMAL LOW (ref 3.77–5.28)
RDW: 12.4 % (ref 11.7–15.4)
WBC: 8.4 10*3/uL (ref 3.4–10.8)

## 2022-10-26 LAB — CMP14+EGFR
ALT: 14 IU/L (ref 0–32)
AST: 19 IU/L (ref 0–40)
Albumin: 4.1 g/dL (ref 3.8–4.8)
Alkaline Phosphatase: 53 IU/L (ref 44–121)
BUN/Creatinine Ratio: 35 — ABNORMAL HIGH (ref 12–28)
BUN: 28 mg/dL — ABNORMAL HIGH (ref 8–27)
Bilirubin Total: 0.2 mg/dL (ref 0.0–1.2)
CO2: 26 mmol/L (ref 20–29)
Calcium: 10 mg/dL (ref 8.7–10.3)
Chloride: 103 mmol/L (ref 96–106)
Creatinine, Ser: 0.8 mg/dL (ref 0.57–1.00)
Globulin, Total: 2.2 g/dL (ref 1.5–4.5)
Glucose: 199 mg/dL — ABNORMAL HIGH (ref 70–99)
Potassium: 5.3 mmol/L — ABNORMAL HIGH (ref 3.5–5.2)
Sodium: 142 mmol/L (ref 134–144)
Total Protein: 6.3 g/dL (ref 6.0–8.5)
eGFR: 76 mL/min/{1.73_m2} (ref >59)

## 2022-10-26 LAB — BAYER DCA HB A1C WAIVED: HB A1C (BAYER DCA - WAIVED): 6.6 % — ABNORMAL HIGH (ref 4.8–5.6)

## 2022-10-26 LAB — LIPID PANEL
Chol/HDL Ratio: 3 ratio (ref 0.0–4.4)
Cholesterol, Total: 151 mg/dL (ref 100–199)
HDL: 50 mg/dL (ref >39)
LDL Chol Calc (NIH): 87 mg/dL (ref 0–99)
Triglycerides: 72 mg/dL (ref 0–149)
VLDL Cholesterol Cal: 14 mg/dL (ref 5–40)

## 2022-10-26 MED ORDER — TRESIBA FLEXTOUCH 100 UNIT/ML ~~LOC~~ SOPN: 12.0000 [IU] | PEN_INJECTOR | Freq: Every day | SUBCUTANEOUS | Status: DC

## 2022-10-26 NOTE — Progress Notes (Signed)
Subjective:  Patient ID: Audrey [name], female    DOB: 05-26-1945  Age: 77 y.o. MRN: [id number]  CC: Follow-up   HPI Audrey [name] presents for adjustment of SSI & long acting tresiba Log of SSI reviewed.. Noted to have several times that glucose fell into the 60-80 range. Pt. States she just ate something. Mabe a little shaky but not much of a reaction occurred.A few times she had elevated glucose over 200. Most were less. The fasting levels were both too high and too low, about evenly distributed.  Pt. Does not adjust SSI for carb content of the meal. She adjusts onl for the glucose preprandially.      10/26/2022    9:37 AM 10/12/2022    8:41 AM 10/02/2015   11:13 AM  Depression screen PHQ 2/9  Decreased Interest 0 1 0  Down, Depressed, Hopeless 0 0 1  PHQ - 2 Score 0 1 1  Altered sleeping  2   Tired, decreased energy  3   Change in appetite  1   Feeling bad or failure about yourself   0   Trouble concentrating  0   Moving slowly or fidgety/restless  2   Suicidal thoughts  0   PHQ-9 Score  9   Difficult doing work/chores  Somewhat difficult     History Audrey [name] has a past medical history of Diabetes mellitus (HCC), Hyperlipidemia, Osteopenia, Pericardial effusion, PONV (postoperative nausea and vomiting), and Seasonal allergies.   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   Her family history includes Cancer in her sister; Diabetes in her brother; Heart attack in her father; Heart disease in her brother.She reports that she has never smoked. She has never used smokeless tobacco. She reports that she does not drink alcohol and does not use drugs.    ROS Review of Systems  Objective:  BP (!) 100/57   Pulse 81   Temp 98.5 F (36.9 C)   Ht 5\' 4"  (1.626 m)   Wt 107 lb 6.4 oz (48.7 kg)   SpO2 96%   BMI 18.44 kg/m   BP Readings from Last 3 Encounters:  10/26/22 (!) 100/57   10/12/22 (!) 98/56  10/08/22 (!) 153/67    Wt Readings from Last 3 Encounters:  10/26/22 107 lb 6.4 oz (48.7 kg)  10/12/22 102 lb 12.8 oz (46.6 kg)  10/07/22 104 lb 12.8 oz (47.5 kg)     Physical Exam Constitutional:      General: She is not in acute distress.    Appearance: Normal appearance. She is not ill-appearing or toxic-appearing.  HENT:     Head: Normocephalic.  Cardiovascular:     Rate and Rhythm: Normal rate and regular rhythm.  Pulmonary:     Effort: Pulmonary effort is normal.     Breath sounds: Normal breath sounds.  Neurological:     General: No focal deficit present.     Cranial Nerves: No cranial nerve deficit.  Psychiatric:        Mood and Affect: Mood normal.        Behavior: Behavior normal.        Thought Content: Thought content normal.        Judgment: Judgment normal.       Assessment & Plan:   Audrey [name] was seen today for follow-up.  Diagnoses and all orders for this visit:  Type 1 diabetes mellitus with hypoglycemia and without  coma (HCC) -     Bayer DCA Hb A1c Waived -     CMP14+EGFR -     Lipid panel -     CBC with Differential/Platelet  Other orders -     insulin degludec (TRESIBA FLEXTOUCH) 100 UNIT/ML FlexTouch Pen; Inject 12 Units into the skin daily.      Sliding scale insulin with Novolog  0-100      take  0 units 101-140  take 2 units 141-200  take 3 units 201- 250 take 4 units 251-300  take 5 units 301- 350 take 6 units OVER 350 take 7 units  I have changed Audrey [name]. Audrey [name] Audrey [name] FlexTouch. I am also having her maintain her pravastatin, aspirin, Calcium Carb-Cholecalciferol, TURMERIC PO, multivitamin-lutein, ascorbic acid, onetouch ultrasoft, NovoLOG FlexPen, FreeStyle Libre 2 Sensor, ondansetron, alendronate, Magnesium, Vitamin D-3, meloxicam, loratadine, Melatonin, sertraline, Insulin Pen Needle, and lisinopril.  Allergies as of 10/26/2022       Reactions   Morphine And Codeine    Januvia [sitagliptin]          Medication List        Accurate as of October 26, 2022  4:59 PM. If you have any questions, ask your nurse or doctor.          alendronate 70 MG tablet Commonly known as: FOSAMAX Take 1 tablet (70 mg total) by mouth every 7 (seven) days. Take with a full glass of water on an empty stomach.   Allergy Relief 10 MG tablet Generic drug: loratadine Take 10 mg by mouth daily.   ascorbic acid 500 MG tablet Commonly known as: VITAMIN C Take 500 mg by mouth daily.   aspirin 81 MG tablet Take 81 mg by mouth every other day.   Calcium Carb-Cholecalciferol 600-800 MG-UNIT Tabs Take by mouth.   FreeStyle Libre 2 Sensor Misc Change every 14 days   Insulin Pen Needle 32G X 4 MM Misc Use 4x a day   lisinopril 5 MG tablet Commonly known as: ZESTRIL Take 1 tablet (5 mg total) by mouth every other day.   Magnesium 250 MG Tabs Take 250 mg by mouth daily.   Melatonin 10 MG Tabs Take 10 mg by mouth at bedtime.   meloxicam 15 MG tablet Commonly known as: MOBIC Take 15 mg by mouth daily.   multivitamin-lutein Caps capsule Take 1 capsule by mouth daily.   NovoLOG FlexPen 100 UNIT/ML FlexPen Generic drug: insulin aspart Max daily 30 units   ondansetron 4 MG disintegrating tablet Commonly known as: ZOFRAN-ODT Take 1 tablet (4 mg total) by mouth every 8 (eight) hours as needed for nausea or vomiting.   onetouch ultrasoft lancets 1 each by Other route 4 (four) times daily. E11.9   pravastatin 40 MG tablet Commonly known as: PRAVACHOL Take 80 mg by mouth daily.   sertraline 50 MG tablet Commonly known as: ZOLOFT Take 50 mg by mouth daily.   Audrey [name] FlexTouch 100 UNIT/ML FlexTouch Pen Generic drug: insulin degludec Inject 12 Units into the skin daily. What changed: how much to take Changed by: Audrey [name]   TURMERIC PO Take by mouth.   Vitamin D-3 125 MCG (5000 UT) Tabs Take 125 mcg by mouth daily.       32 minutes was spent with the patient. More than  1/2 was spent in counseling regarding SSI for novolog and using [place]. She was also asked to see if her daughter in law, who prepares her meals, would be able to start estimating  the carbohydrate content of each meal.  Follow-up: Return in about 2 weeks (around 11/09/2022) for diabetes.  Mechele Claude, M.D.

## 2022-10-26 NOTE — Patient Instructions (Signed)
Sliding scale insulin with Novolog  0-100      take  0 units 101-140  take 2 units 141-200  take 3 units 201- 250 take 4 units 251-300  take 5 units 301- 350 take 6 units OVER 350 take 7 units

## 2022-10-27 ENCOUNTER — Ambulatory Visit: Payer: Medicare HMO

## 2022-10-27 ENCOUNTER — Other Ambulatory Visit: Payer: Self-pay | Admitting: Family Medicine

## 2022-10-27 ENCOUNTER — Telehealth: Payer: Self-pay | Admitting: Family Medicine

## 2022-10-27 DIAGNOSIS — R2681 Unsteadiness on feet: Secondary | ICD-10-CM | POA: Diagnosis not present

## 2022-10-27 DIAGNOSIS — Z9181 History of falling: Secondary | ICD-10-CM

## 2022-10-27 DIAGNOSIS — E109 Type 1 diabetes mellitus without complications: Secondary | ICD-10-CM

## 2022-10-27 DIAGNOSIS — M6281 Muscle weakness (generalized): Secondary | ICD-10-CM | POA: Diagnosis not present

## 2022-10-27 MED ORDER — PRAVASTATIN SODIUM 40 MG PO TABS: 80.0000 mg | ORAL_TABLET | Freq: Every day | ORAL | 1 refills | Status: DC

## 2022-10-27 MED ORDER — LISINOPRIL 5 MG PO TABS
5.0000 mg | ORAL_TABLET | ORAL | 3 refills | Status: DC
Start: 2022-10-27 — End: 2023-06-27

## 2022-10-27 MED ORDER — MELOXICAM 15 MG PO TABS: 15.0000 mg | ORAL_TABLET | Freq: Every day | ORAL | 1 refills | Status: DC

## 2022-10-27 NOTE — Telephone Encounter (Signed)
  Prescription Request  10/27/2022  Is this a "Controlled Substance" medicine? no  Have you seen your PCP in the last 2 weeks? Yes on 10/26/2022  If YES, route message to pool  -  If NO, patient needs to be scheduled for appointment.  What is the name of the medication or equipment? meloxicam (MOBIC) 15 MG tablet  pravastatin (PRAVACHOL) 40 MG tablet  lisinopril (ZESTRIL) 5 MG tablet   Have you contacted your pharmacy to request a refill? yes   Which pharmacy would you like this sent to? [facility] [place]    Patient notified that their request is being sent to the clinical staff for review and that they should receive a response within 2 business days.

## 2022-10-27 NOTE — Therapy (Signed)
OUTPATIENT PHYSICAL THERAPY LOWER EXTREMITY TREATMENT   Patient Name: Audrey [name] MRN: [id number] DOB:07-Sep-1945, 77 y.o., female Today's Date: 10/27/2022  END OF SESSION:  PT End of Session - 10/27/22 1032     Visit Number 3    Number of Visits 8    Date for PT Re-Evaluation 12/03/22    PT Start Time 1015    PT Stop Time 1058    PT Time Calculation (min) 43 min    Activity Tolerance Patient tolerated treatment well    Behavior During Therapy [facility] for tasks assessed/performed              Past Medical History:  Diagnosis Date   Diabetes mellitus (HCC)    Hyperlipidemia    Osteopenia    2002 T-Score -2   Pericardial effusion    PONV (postoperative nausea and vomiting)    Seasonal allergies    Past Surgical History:  Procedure Laterality Date   rectal polyps removed     right wrist fx     SUBXYPHOID PERICARDIAL WINDOW  02/18/2012   Procedure: SUBXYPHOID PERICARDIAL WINDOW;  Surgeon: Kerin Perna, MD;  Location: [facility] OR;  Service: Thoracic;  Laterality: N/A;  TEE   Patient Active Problem List   Diagnosis Date Noted   Type 1 diabetes mellitus without complications (HCC) 04/02/2020   Osteoporosis 03/27/2018   Ingrowing toenail 10/19/2017   Left elbow pain 11/15/2013   S/P pericardial surgery 02/20/2012   Second degree AV block, Mobitz type I 02/18/2012   Pericardial effusion 02/16/2012   Seasonal allergies    Osteopenia    REFERRING PROVIDER: Mechele Claude, MD   REFERRING DIAG: Fall, initial encounter; Unsteady gait   THERAPY DIAG:  Muscle weakness (generalized)  History of falling  Rationale for Evaluation and Treatment: Rehabilitation  ONSET DATE: 2-3 weeks ago  SUBJECTIVE:   SUBJECTIVE STATEMENT: Patient reports that she did good after her last appointment.   PERTINENT HISTORY: Diabetes type 1, osteoporosis, and allergies PAIN:  Are you having pain? Yes: NPRS scale: 1-2/10 Pain location: left leg Pain description: primarily  sore Aggravating factors: standing (about 15 minutes)  Relieving factors: rest  PRECAUTIONS: Fall  RED FLAGS: None   WEIGHT BEARING RESTRICTIONS: No  FALLS:  Has patient fallen in last 6 months? Yes. Number of falls 1  LIVING ENVIRONMENT: Lives with: lives with their family Lives in: House/apartment Stairs: Yes: Internal: 1 flight steps; can reach both and External: "a few" steps; none Has following equipment at home: Single point cane  OCCUPATION: retired  PLOF: Independent  PATIENT GOALS: be able to walk safely  NEXT MD VISIT: 10/26/22  OBJECTIVE:   COGNITION: Overall cognitive status: Within functional limits for tasks assessed     SENSATION: Patient reports no numbness or tingling  LOWER EXTREMITY ROM: WFL for activities assessed  LOWER EXTREMITY MMT:  MMT Right eval Left eval  Hip flexion 4/5 4-/5; left knee pain   Hip extension    Hip abduction    Hip adduction    Hip internal rotation    Hip external rotation    Knee flexion 3+/5 4-/5  Knee extension 4+/5 4/5  Ankle dorsiflexion 4/5 4/5  Ankle plantarflexion    Ankle inversion    Ankle eversion     (Blank rows = not tested)  FUNCTIONAL TESTS:  5 times sit to stand: 17.42 seconds without upper extremity support Timed up and go (TUG): 18.45 seconds without an assistive device  BALANCE:  Narrow BOS, firm  surface, eyes open: 30 seconds   Narrow BOS, firm surface, eyes closed: 30 seconds  Tandem, firm surface, eyes open: 3 seconds bilaterally   GAIT: Assistive device utilized: Single point cane Level of assistance: Modified independence Comments: decreased gait speed and knee flexion in stance phase bilaterally   TODAY'S TREATMENT:                                                                                                                              DATE:                                     10/27/22 EXERCISE LOG  Exercise Repetitions and Resistance Comments  Nustep  L4 x 17 minutes     Standing HS curl  3# x 2 minutes    LAQ 3# x 2 minutes  Alternating LE   Static stance on foam  2. 5 minutes  Intermittent UE support   Marching on foam 1.5 minutes  Intermittent UE support   Forward step up  6" step x 20 reps each    Lateral step up  6" step x 15 reps each     Blank cell = exercise not performed today                                    10/22/22 EXERCISE LOG  Exercise Repetitions and Resistance Comments  Nustep  Lvl 2 x 15 mins   Rockerboard 3 mins   Forward Step Ups 4" x 20 reps bil   LAQs 2# x 20 reps bil   Seated Marches 2# x 20 reps bil   Clams Red x 2 mins   Ball Squeezes 2 mins   Seated Ham Curls    STS     Blank cell = exercise not performed today   PATIENT EDUCATION:  Education details: POC, prognosis, objective findings, and goals for therapy Person educated: Patient Education method: Explanation Education comprehension: verbalized understanding  HOME EXERCISE PROGRAM:   ASSESSMENT:  CLINICAL IMPRESSION: Patient was progressed with multiple new interventions for improved lower extremity strength needed for improved functional mobility. She required minimal cueing with today's interventions for proper exercise performance. She reported feeling slightly sore upon the conclusion of treatment. She continues to require skilled physical therapy to address her remaining impairments to maximize her safety and functional mobility.   OBJECTIVE IMPAIRMENTS: Abnormal gait, decreased activity tolerance, decreased balance, decreased mobility, difficulty walking, decreased strength, and pain.   ACTIVITY LIMITATIONS: carrying, standing, stairs, and locomotion level  PARTICIPATION LIMITATIONS: meal prep, cleaning, laundry, and shopping  PERSONAL FACTORS: Age and 3+ comorbidities: Diabetes type 1, osteoporosis, and allergies  are also affecting patient's functional outcome.   REHAB POTENTIAL: Good  CLINICAL DECISION MAKING: Evolving/moderate  complexity  EVALUATION COMPLEXITY: Moderate  GOALS: Goals reviewed with patient? Yes  LONG TERM GOALS: Target date: 11/17/22  Patient will be independent with her HEP.  Baseline:  Goal status: INITIAL  2.  Patient will improve her five time sit to stand time to 12 seconds or less for a reduced fall risk. Baseline:  Goal status: INITIAL  3.  Patient will improve her timed up and go time to 12 seconds or less for reduced fall risk.  Baseline:  Goal status: INITIAL  4.  Patient will report being able to stand for at least 25 minutes for improved function with her household activities.  Baseline:  Goal status: INITIAL  PLAN:  PT FREQUENCY: 2x/week  PT DURATION: 4 weeks  PLANNED INTERVENTIONS: Therapeutic exercises, Therapeutic activity, Neuromuscular re-education, Balance training, Gait training, Patient/Family education, Self Care, Joint mobilization, Stair training, and Re-evaluation  PLAN FOR NEXT SESSION: nustep, lower extremity strengthening, and balance interventions   Granville Lewis, PT 10/27/2022, 12:50 PM

## 2022-10-27 NOTE — Telephone Encounter (Signed)
Please let the patient know that I sent their prescription to their pharmacy. Thanks, WS 

## 2022-10-28 NOTE — Telephone Encounter (Signed)
Patient aware.

## 2022-10-29 ENCOUNTER — Telehealth: Payer: Self-pay

## 2022-10-29 ENCOUNTER — Ambulatory Visit: Payer: Medicare HMO

## 2022-10-29 ENCOUNTER — Other Ambulatory Visit (HOSPITAL_COMMUNITY): Payer: Self-pay

## 2022-10-29 DIAGNOSIS — M6281 Muscle weakness (generalized): Secondary | ICD-10-CM

## 2022-10-29 DIAGNOSIS — Z9181 History of falling: Secondary | ICD-10-CM

## 2022-10-29 DIAGNOSIS — R2681 Unsteadiness on feet: Secondary | ICD-10-CM | POA: Diagnosis not present

## 2022-10-29 NOTE — Therapy (Signed)
OUTPATIENT PHYSICAL THERAPY LOWER EXTREMITY TREATMENT   Patient Name: Audrey [name] MRN: [id number] DOB:05/03/1945, 77 y.o., female Today's Date: 10/29/2022  END OF SESSION:  PT End of Session - 10/29/22 1027     Visit Number 4    Number of Visits 8    Date for PT Re-Evaluation 12/03/22    PT Start Time 1015    PT Stop Time 1100    PT Time Calculation (min) 45 min    Activity Tolerance Patient tolerated treatment well    Behavior During Therapy [facility] for tasks assessed/performed               Past Medical History:  Diagnosis Date   Diabetes mellitus (HCC)    Hyperlipidemia    Osteopenia    2002 T-Score -2   Pericardial effusion    PONV (postoperative nausea and vomiting)    Seasonal allergies    Past Surgical History:  Procedure Laterality Date   rectal polyps removed     right wrist fx     SUBXYPHOID PERICARDIAL WINDOW  02/18/2012   Procedure: SUBXYPHOID PERICARDIAL WINDOW;  Surgeon: Kerin Perna, MD;  Location: [facility] OR;  Service: Thoracic;  Laterality: N/A;  TEE   Patient Active Problem List   Diagnosis Date Noted   Type 1 diabetes mellitus without complications (HCC) 04/02/2020   Osteoporosis 03/27/2018   Ingrowing toenail 10/19/2017   Left elbow pain 11/15/2013   S/P pericardial surgery 02/20/2012   Second degree AV block, Mobitz type I 02/18/2012   Pericardial effusion 02/16/2012   Seasonal allergies    Osteopenia    REFERRING PROVIDER: Mechele Claude, MD   REFERRING DIAG: Fall, initial encounter; Unsteady gait   THERAPY DIAG:  Muscle weakness (generalized)  History of falling  Rationale for Evaluation and Treatment: Rehabilitation  ONSET DATE: 2-3 weeks ago  SUBJECTIVE:   SUBJECTIVE STATEMENT: Patient reports that she feels alright today.   PERTINENT HISTORY: Diabetes type 1, osteoporosis, and allergies PAIN:  Are you having pain? Yes: NPRS scale: 1-2/10 Pain location: left leg Pain description: primarily sore Aggravating factors:  standing (about 15 minutes)  Relieving factors: rest  PRECAUTIONS: Fall  RED FLAGS: None   WEIGHT BEARING RESTRICTIONS: No  FALLS:  Has patient fallen in last 6 months? Yes. Number of falls 1  LIVING ENVIRONMENT: Lives with: lives with their family Lives in: House/apartment Stairs: Yes: Internal: 1 flight steps; can reach both and External: "a few" steps; none Has following equipment at home: Single point cane  OCCUPATION: retired  PLOF: Independent  PATIENT GOALS: be able to walk safely  NEXT MD VISIT: 10/26/22  OBJECTIVE:   COGNITION: Overall cognitive status: Within functional limits for tasks assessed     SENSATION: Patient reports no numbness or tingling  LOWER EXTREMITY ROM: WFL for activities assessed  LOWER EXTREMITY MMT:  MMT Right eval Left eval  Hip flexion 4/5 4-/5; left knee pain   Hip extension    Hip abduction    Hip adduction    Hip internal rotation    Hip external rotation    Knee flexion 3+/5 4-/5  Knee extension 4+/5 4/5  Ankle dorsiflexion 4/5 4/5  Ankle plantarflexion    Ankle inversion    Ankle eversion     (Blank rows = not tested)  FUNCTIONAL TESTS:  5 times sit to stand: 17.42 seconds without upper extremity support Timed up and go (TUG): 18.45 seconds without an assistive device  BALANCE:  Narrow BOS, firm surface, eyes  open: 30 seconds   Narrow BOS, firm surface, eyes closed: 30 seconds  Tandem, firm surface, eyes open: 3 seconds bilaterally   GAIT: Assistive device utilized: Single point cane Level of assistance: Modified independence Comments: decreased gait speed and knee flexion in stance phase bilaterally   TODAY'S TREATMENT:                                                                                                                              DATE:                                     10/29/22 EXERCISE LOG  Exercise Repetitions and Resistance Comments  Nustep  L4 x 16 minutes    LAQ 4# x 3 minutes     Marching  4# x 3 minutes    Standing HS curl  4# x 3 minutes    Seated hip ABD  Green t-band x 1.5 minutes   Seated clams  Green t-band x 2 minutes    Standing hip ABD  Green t-band @ knees x 2 minutes    Blank cell = exercise not performed today                                    10/27/22 EXERCISE LOG  Exercise Repetitions and Resistance Comments  Nustep  L4 x 17 minutes    Standing HS curl  3# x 2 minutes    LAQ 3# x 2 minutes  Alternating LE   Static stance on foam  2. 5 minutes  Intermittent UE support   Marching on foam 1.5 minutes  Intermittent UE support   Forward step up  6" step x 20 reps each    Lateral step up  6" step x 15 reps each     Blank cell = exercise not performed today                                    10/22/22 EXERCISE LOG  Exercise Repetitions and Resistance Comments  Nustep  Lvl 2 x 15 mins   Rockerboard 3 mins   Forward Step Ups 4" x 20 reps bil   LAQs 2# x 20 reps bil   Seated Marches 2# x 20 reps bil   Clams Red x 2 mins   Ball Squeezes 2 mins   Seated Ham Curls    STS     Blank cell = exercise not performed today   PATIENT EDUCATION:  Education details: POC, prognosis, objective findings, and goals for therapy Person educated: Patient Education method: Explanation Education comprehension: verbalized understanding  HOME EXERCISE PROGRAM:   ASSESSMENT:  CLINICAL IMPRESSION: Patient was progressed with multiple new and familiar resisted interventions  for improved lower extremity strengthening with moderate difficulty. She required minimal verbal cueing with today's weighted interventions for increased step height as her arc of motion slowly decreased as she became fatigued. She required brief seated rest breaks throughout treatment. She reported feeling a little tired upon the conclusion of treatment. She continues to require skilled physical therapy to address her remaining impairments to maximize her safety and functional mobility.    OBJECTIVE IMPAIRMENTS: Abnormal gait, decreased activity tolerance, decreased balance, decreased mobility, difficulty walking, decreased strength, and pain.   ACTIVITY LIMITATIONS: carrying, standing, stairs, and locomotion level  PARTICIPATION LIMITATIONS: meal prep, cleaning, laundry, and shopping  PERSONAL FACTORS: Age and 3+ comorbidities: Diabetes type 1, osteoporosis, and allergies  are also affecting patient's functional outcome.   REHAB POTENTIAL: Good  CLINICAL DECISION MAKING: Evolving/moderate complexity  EVALUATION COMPLEXITY: Moderate   GOALS: Goals reviewed with patient? Yes  LONG TERM GOALS: Target date: 11/17/22  Patient will be independent with her HEP.  Baseline:  Goal status: INITIAL  2.  Patient will improve her five time sit to stand time to 12 seconds or less for a reduced fall risk. Baseline:  Goal status: INITIAL  3.  Patient will improve her timed up and go time to 12 seconds or less for reduced fall risk.  Baseline:  Goal status: INITIAL  4.  Patient will report being able to stand for at least 25 minutes for improved function with her household activities.  Baseline:  Goal status: INITIAL  PLAN:  PT FREQUENCY: 2x/week  PT DURATION: 4 weeks  PLANNED INTERVENTIONS: Therapeutic exercises, Therapeutic activity, Neuromuscular re-education, Balance training, Gait training, Patient/Family education, Self Care, Joint mobilization, Stair training, and Re-evaluation  PLAN FOR NEXT SESSION: nustep, lower extremity strengthening, and balance interventions   Granville Lewis, PT 10/29/2022, 1:18 PM

## 2022-10-29 NOTE — Telephone Encounter (Signed)
Pharmacy Patient Advocate Encounter   Received notification from CoverMyMeds that prior authorization for Pravastatin Sodium 40MG  tablets is required/requested.   Insurance verification completed.   The patient is insured through [facility] .   Per test claim:  Pravastatin 80MG  - 1 tablet per day is preferred by the insurance.  If suggested medication is appropriate, Please send in a new RX and discontinue this one. If not, please advise as to why it's not appropriate so that we may request a Prior Authorization.

## 2022-10-31 ENCOUNTER — Other Ambulatory Visit: Payer: Self-pay | Admitting: Family Medicine

## 2022-10-31 MED ORDER — PRAVASTATIN SODIUM 80 MG PO TABS: 80.0000 mg | ORAL_TABLET | Freq: Every day | ORAL | 3 refills | Status: DC

## 2022-10-31 NOTE — Telephone Encounter (Signed)
Please let the patient know that I sent their prescription to their pharmacy. Thanks, WS 

## 2022-11-01 ENCOUNTER — Ambulatory Visit: Payer: Medicare HMO

## 2022-11-01 DIAGNOSIS — M6281 Muscle weakness (generalized): Secondary | ICD-10-CM

## 2022-11-01 DIAGNOSIS — Z9181 History of falling: Secondary | ICD-10-CM | POA: Diagnosis not present

## 2022-11-01 DIAGNOSIS — R2681 Unsteadiness on feet: Secondary | ICD-10-CM | POA: Diagnosis not present

## 2022-11-01 NOTE — Therapy (Signed)
OUTPATIENT PHYSICAL THERAPY LOWER EXTREMITY TREATMENT   Patient Name: TANIA HAAGEN MRN: [id number] DOB:26-Jun-1945, 77 y.o., female Today's Date: 11/01/2022  END OF SESSION:  PT End of Session - 11/01/22 1106     Visit Number 5    Number of Visits 8    Date for PT Re-Evaluation 12/03/22    PT Start Time 1100    PT Stop Time 1145    PT Time Calculation (min) 45 min    Activity Tolerance Patient tolerated treatment well    Behavior During Therapy [facility] for tasks assessed/performed               Past Medical History:  Diagnosis Date   Diabetes mellitus (HCC)    Hyperlipidemia    Osteopenia    2002 T-Score -2   Pericardial effusion    PONV (postoperative nausea and vomiting)    Seasonal allergies    Past Surgical History:  Procedure Laterality Date   rectal polyps removed     right wrist fx     SUBXYPHOID PERICARDIAL WINDOW  02/18/2012   Procedure: SUBXYPHOID PERICARDIAL WINDOW;  Surgeon: Kerin Perna, MD;  Location: [facility] OR;  Service: Thoracic;  Laterality: N/A;  TEE   Patient Active Problem List   Diagnosis Date Noted   Type 1 diabetes mellitus without complications (HCC) 04/02/2020   Osteoporosis 03/27/2018   Ingrowing toenail 10/19/2017   Left elbow pain 11/15/2013   S/P pericardial surgery 02/20/2012   Second degree AV block, Mobitz type I 02/18/2012   Pericardial effusion 02/16/2012   Seasonal allergies    Osteopenia    REFERRING PROVIDER: Mechele Claude, MD   REFERRING DIAG: Fall, initial encounter; Unsteady gait   THERAPY DIAG:  Muscle weakness (generalized)  History of falling  Rationale for Evaluation and Treatment: Rehabilitation  ONSET DATE: 2-3 weeks ago  SUBJECTIVE:   SUBJECTIVE STATEMENT: Patient reports that she feels tired today.  PERTINENT HISTORY: Diabetes type 1, osteoporosis, and allergies PAIN:  Are you having pain? No  PRECAUTIONS: Fall  RED FLAGS: None   WEIGHT BEARING RESTRICTIONS: No  FALLS:  Has patient  fallen in last 6 months? Yes. Number of falls 1  LIVING ENVIRONMENT: Lives with: lives with their family Lives in: House/apartment Stairs: Yes: Internal: 1 flight steps; can reach both and External: "a few" steps; none Has following equipment at home: Single point cane  OCCUPATION: retired  PLOF: Independent  PATIENT GOALS: be able to walk safely  NEXT MD VISIT: 10/26/22  OBJECTIVE:   COGNITION: Overall cognitive status: Within functional limits for tasks assessed     SENSATION: Patient reports no numbness or tingling  LOWER EXTREMITY ROM: WFL for activities assessed  LOWER EXTREMITY MMT:  MMT Right eval Left eval  Hip flexion 4/5 4-/5; left knee pain   Hip extension    Hip abduction    Hip adduction    Hip internal rotation    Hip external rotation    Knee flexion 3+/5 4-/5  Knee extension 4+/5 4/5  Ankle dorsiflexion 4/5 4/5  Ankle plantarflexion    Ankle inversion    Ankle eversion     (Blank rows = not tested)  FUNCTIONAL TESTS:  5 times sit to stand: 17.42 seconds without upper extremity support Timed up and go (TUG): 18.45 seconds without an assistive device  BALANCE:  Narrow BOS, firm surface, eyes open: 30 seconds   Narrow BOS, firm surface, eyes closed: 30 seconds  Tandem, firm surface, eyes open: 3 seconds bilaterally  GAIT: Assistive device utilized: Single point cane Level of assistance: Modified independence Comments: decreased gait speed and knee flexion in stance phase bilaterally   TODAY'S TREATMENT:                                                                                                                              DATE:                                     11/01/22 EXERCISE LOG  Exercise Repetitions and Resistance Comments  Nustep  L4 x 16 minutes    Rockerboard 3 mins   LAQ 5# x 2 minutes    Marching  5# x 2 minutes    Standing HS curl  5# x 2 minutes    Seated hip ADD  Green t-band x 2 minutes   Seated clams  Green  t-band x 2 minutes    STS  X 15 reps no UE    Heel/Toe Raises  X30 reps each    Blank cell = exercise not performed today                                    10/27/22 EXERCISE LOG  Exercise Repetitions and Resistance Comments  Nustep  L4 x 17 minutes    Standing HS curl  3# x 2 minutes    LAQ 3# x 2 minutes  Alternating LE   Static stance on foam  2. 5 minutes  Intermittent UE support   Marching on foam 1.5 minutes  Intermittent UE support   Forward step up  6" step x 20 reps each    Lateral step up  6" step x 15 reps each     Blank cell = exercise not performed today   PATIENT EDUCATION:  Education details: POC, prognosis, objective findings, and goals for therapy Person educated: Patient Education method: Explanation Education comprehension: verbalized understanding  HOME EXERCISE PROGRAM:   ASSESSMENT:  CLINICAL IMPRESSION: Pt arrives for today's treatment session denying any pain, but reports feeling tired today.  Pt able to perform TUG in 10.5 seconds and 5 STS test in 10.03 seconds meeting both of her goals.  Pt is making good progress towards her other goals, being able to tolerate standing approximately 10-15 minutes before requiring a seated rest break.  Pt able to tolerate increased resistance or time with all exercises today without discomfort.  Pt given seated rest breaks as needed due to fatigue.  Pt denied any pain at completion of today's treatment session.  OBJECTIVE IMPAIRMENTS: Abnormal gait, decreased activity tolerance, decreased balance, decreased mobility, difficulty walking, decreased strength, and pain.   ACTIVITY LIMITATIONS: carrying, standing, stairs, and locomotion level  PARTICIPATION LIMITATIONS: meal prep, cleaning, laundry, and shopping  PERSONAL FACTORS: Age and 3+ comorbidities: Diabetes  type 1, osteoporosis, and allergies  are also affecting patient's functional outcome.   REHAB POTENTIAL: Good  CLINICAL DECISION MAKING: Evolving/moderate  complexity  EVALUATION COMPLEXITY: Moderate   GOALS: Goals reviewed with patient? Yes  LONG TERM GOALS: Target date: 11/17/22  Patient will be independent with her HEP.  Baseline:  Goal status: IN PROGRESS  2.  Patient will improve her five time sit to stand time to 12 seconds or less for a reduced fall risk. Baseline: 10.03 seconds Goal status: MET  3.  Patient will improve her timed up and go time to 12 seconds or less for reduced fall risk.  Baseline: 8/26: 10.5 seconds Goal status: MET  4.  Patient will report being able to stand for at least 25 minutes for improved function with her household activities.  Baseline: 8/26: 10-15 minutes Goal status: IN PROGRESS  PLAN:  PT FREQUENCY: 2x/week  PT DURATION: 4 weeks  PLANNED INTERVENTIONS: Therapeutic exercises, Therapeutic activity, Neuromuscular re-education, Balance training, Gait training, Patient/Family education, Self Care, Joint mobilization, Stair training, and Re-evaluation  PLAN FOR NEXT SESSION: nustep, lower extremity strengthening, and balance interventions   Newman Pies, PTA 11/01/2022, 11:47 AM

## 2022-11-02 ENCOUNTER — Telehealth: Payer: Self-pay | Admitting: Family Medicine

## 2022-11-03 ENCOUNTER — Ambulatory Visit (INDEPENDENT_AMBULATORY_CARE_PROVIDER_SITE_OTHER): Payer: Medicare HMO | Admitting: Internal Medicine

## 2022-11-03 DIAGNOSIS — E119 Type 2 diabetes mellitus without complications: Secondary | ICD-10-CM | POA: Diagnosis not present

## 2022-11-03 DIAGNOSIS — E1165 Type 2 diabetes mellitus with hyperglycemia: Secondary | ICD-10-CM | POA: Diagnosis not present

## 2022-11-03 NOTE — Telephone Encounter (Signed)
Patient calling to check on Prolia shot, she would like to have the medication sent to [facility] in [place]. Please call her and let her know when this is done. She would still like to get the shot when she comes in to see PCP on 11/15/22 if possible.

## 2022-11-04 ENCOUNTER — Other Ambulatory Visit (HOSPITAL_COMMUNITY): Payer: Self-pay

## 2022-11-04 ENCOUNTER — Ambulatory Visit: Payer: Medicare HMO | Admitting: Physical Therapy

## 2022-11-04 DIAGNOSIS — M6281 Muscle weakness (generalized): Secondary | ICD-10-CM | POA: Diagnosis not present

## 2022-11-04 DIAGNOSIS — Z9181 History of falling: Secondary | ICD-10-CM | POA: Diagnosis not present

## 2022-11-04 DIAGNOSIS — R2681 Unsteadiness on feet: Secondary | ICD-10-CM | POA: Diagnosis not present

## 2022-11-04 NOTE — Telephone Encounter (Signed)
Prolia BIV has been completed. Tried to test claim Prolia, came back therapeutic duplicate. Patient filled Alendronate at [facility] on 10/11/22. Pharmacy may be able to override rejection.

## 2022-11-04 NOTE — Therapy (Signed)
OUTPATIENT PHYSICAL THERAPY LOWER EXTREMITY TREATMENT   Patient Name: Audrey [name] MRN: [id number] DOB:Nov 06, 1945, 77 y.o., female Today's Date: 11/04/2022  END OF SESSION:  PT End of Session - 11/04/22 0933     Visit Number 6    Number of Visits 8    Date for PT Re-Evaluation 12/03/22    PT Start Time 0930    PT Stop Time 1011    PT Time Calculation (min) 41 min    Activity Tolerance Patient tolerated treatment well    Behavior During Therapy [facility] for tasks assessed/performed               Past Medical History:  Diagnosis Date   Diabetes mellitus (HCC)    Hyperlipidemia    Osteopenia    2002 T-Score -2   Pericardial effusion    PONV (postoperative nausea and vomiting)    Seasonal allergies    Past Surgical History:  Procedure Laterality Date   rectal polyps removed     right wrist fx     SUBXYPHOID PERICARDIAL WINDOW  02/18/2012   Procedure: SUBXYPHOID PERICARDIAL WINDOW;  Surgeon: Kerin Perna, MD;  Location: [facility] OR;  Service: Thoracic;  Laterality: N/A;  TEE   Patient Active Problem List   Diagnosis Date Noted   Type 1 diabetes mellitus without complications (HCC) 04/02/2020   Osteoporosis 03/27/2018   Ingrowing toenail 10/19/2017   Left elbow pain 11/15/2013   S/P pericardial surgery 02/20/2012   Second degree AV block, Mobitz type I 02/18/2012   Pericardial effusion 02/16/2012   Seasonal allergies    Osteopenia    REFERRING PROVIDER: Mechele Claude, MD   REFERRING DIAG: Fall, initial encounter; Unsteady gait   THERAPY DIAG:  Muscle weakness (generalized)  History of falling  Rationale for Evaluation and Treatment: Rehabilitation  ONSET DATE: 2-3 weeks ago  SUBJECTIVE:   SUBJECTIVE STATEMENT: Patient reports that she feels tired today from staying up late watching TV last night.   PERTINENT HISTORY: Diabetes type 1, osteoporosis, and allergies PAIN:  Are you having pain? No  PRECAUTIONS: Fall  RED FLAGS: None   WEIGHT BEARING  RESTRICTIONS: No  FALLS:  Has patient fallen in last 6 months? Yes. Number of falls 1  LIVING ENVIRONMENT: Lives with: lives with their family Lives in: House/apartment Stairs: Yes: Internal: 1 flight steps; can reach both and External: "a few" steps; none Has following equipment at home: Single point cane  OCCUPATION: retired  PLOF: Independent  PATIENT GOALS: be able to walk safely  NEXT MD VISIT: 10/26/22  OBJECTIVE:   COGNITION: Overall cognitive status: Within functional limits for tasks assessed     SENSATION: Patient reports no numbness or tingling  LOWER EXTREMITY ROM: WFL for activities assessed  LOWER EXTREMITY MMT:  MMT Right eval Left eval  Hip flexion 4/5 4-/5; left knee pain   Hip extension    Hip abduction    Hip adduction    Hip internal rotation    Hip external rotation    Knee flexion 3+/5 4-/5  Knee extension 4+/5 4/5  Ankle dorsiflexion 4/5 4/5  Ankle plantarflexion    Ankle inversion    Ankle eversion     (Blank rows = not tested)  FUNCTIONAL TESTS:  5 times sit to stand: 17.42 seconds without upper extremity support Timed up and go (TUG): 18.45 seconds without an assistive device  BALANCE:  Narrow BOS, firm surface, eyes open: 30 seconds   Narrow BOS, firm surface, eyes closed: 30 seconds  Tandem, firm surface, eyes open: 3 seconds bilaterally   GAIT: Assistive device utilized: Single point cane Level of assistance: Modified independence Comments: decreased gait speed and knee flexion in stance phase bilaterally   TODAY'S TREATMENT:                                                                                                                              DATE:                                     11/04/22 EXERCISE LOG  Exercise Repetitions and Resistance Comments  Nustep  L4 x 16 minutes    Marches Airex x 2 mins   Toe Taps  8" box x 3 mins   Rockerboard 4 mins   LAQ 5# x 2 minutes    Marching  5# x 2 minutes    Standing  HS curl  5# x 2 minutes    Seated hip ADD  x 2.5 minutes   Seated clams  Green t-band x 2.5 minutes    STS  X 15 reps no UE    Heel/Toe Raises  X2 mins    Blank cell = exercise not performed today                                    PATIENT EDUCATION:  Education details: POC, prognosis, objective findings, and goals for therapy Person educated: Patient Education method: Explanation Education comprehension: verbalized understanding  HOME EXERCISE PROGRAM:   ASSESSMENT:  CLINICAL IMPRESSION: Pt arrives for today's treatment session denying any pain, but reports being tired and sleepy due to staying up late last night watching tv.  Pt able to tolerate increased standing balance activities today with pt requiring BUE support with all balance exercises.  Reviewed previously seated exercises performed at last session due to increased fatigue upon completion of balance exercises.  Pt denied any pain at completion of today's treatment session.  OBJECTIVE IMPAIRMENTS: Abnormal gait, decreased activity tolerance, decreased balance, decreased mobility, difficulty walking, decreased strength, and pain.   ACTIVITY LIMITATIONS: carrying, standing, stairs, and locomotion level  PARTICIPATION LIMITATIONS: meal prep, cleaning, laundry, and shopping  PERSONAL FACTORS: Age and 3+ comorbidities: Diabetes type 1, osteoporosis, and allergies  are also affecting patient's functional outcome.   REHAB POTENTIAL: Good  CLINICAL DECISION MAKING: Evolving/moderate complexity  EVALUATION COMPLEXITY: Moderate   GOALS: Goals reviewed with patient? Yes  LONG TERM GOALS: Target date: 11/17/22  Patient will be independent with her HEP.  Baseline:  Goal status: IN PROGRESS  2.  Patient will improve her five time sit to stand time to 12 seconds or less for a reduced fall risk. Baseline: 10.03 seconds Goal status: MET  3.  Patient will improve her timed up and go time to 12 seconds  or less for reduced fall  risk.  Baseline: 8/26: 10.5 seconds Goal status: MET  4.  Patient will report being able to stand for at least 25 minutes for improved function with her household activities.  Baseline: 8/26: 10-15 minutes Goal status: IN PROGRESS  PLAN:  PT FREQUENCY: 2x/week  PT DURATION: 4 weeks  PLANNED INTERVENTIONS: Therapeutic exercises, Therapeutic activity, Neuromuscular re-education, Balance training, Gait training, Patient/Family education, Self Care, Joint mobilization, Stair training, and Re-evaluation  PLAN FOR NEXT SESSION: nustep, lower extremity strengthening, and balance interventions   Newman Pies, PTA 11/04/2022, 10:14 AM

## 2022-11-09 ENCOUNTER — Ambulatory Visit: Payer: Medicare HMO | Attending: Family Medicine

## 2022-11-09 DIAGNOSIS — M6281 Muscle weakness (generalized): Secondary | ICD-10-CM | POA: Diagnosis not present

## 2022-11-09 DIAGNOSIS — Z9181 History of falling: Secondary | ICD-10-CM | POA: Diagnosis not present

## 2022-11-09 NOTE — Therapy (Signed)
OUTPATIENT PHYSICAL THERAPY LOWER EXTREMITY TREATMENT   Patient Name: Audrey [name] MRN: [id number] DOB:[birth year], 77 y.o., female Today's Date: 11/09/2022  END OF SESSION:  PT End of Session - 11/09/22 1024     Visit Number 7    Number of Visits 8    Date for PT Re-Evaluation 12/03/22    PT Start Time 1015    PT Stop Time 1100    PT Time Calculation (min) 45 min    Activity Tolerance Patient tolerated treatment well    Behavior During Therapy [facility] for tasks assessed/performed               Past Medical History:  Diagnosis Date   Diabetes mellitus (HCC)    Hyperlipidemia    Osteopenia    2002 T-Score -2   Pericardial effusion    PONV (postoperative nausea and vomiting)    Seasonal allergies    Past Surgical History:  Procedure Laterality Date   rectal polyps removed     right wrist fx     SUBXYPHOID PERICARDIAL WINDOW  02/18/2012   Procedure: SUBXYPHOID PERICARDIAL WINDOW;  Surgeon: Kerin Perna, MD;  Location: [facility] OR;  Service: Thoracic;  Laterality: N/A;  TEE   Patient Active Problem List   Diagnosis Date Noted   Type 1 diabetes mellitus without complications (HCC) 04/02/2020   Osteoporosis 03/27/2018   Ingrowing toenail 10/19/2017   Left elbow pain 11/15/2013   S/P pericardial surgery 02/20/2012   Second degree AV block, Mobitz type I 02/18/2012   Pericardial effusion 02/16/2012   Seasonal allergies    Osteopenia    REFERRING PROVIDER: Mechele Claude, MD   REFERRING DIAG: Fall, initial encounter; Unsteady gait   THERAPY DIAG:  Muscle weakness (generalized)  History of falling  Rationale for Evaluation and Treatment: Rehabilitation  ONSET DATE: 2-3 weeks ago  SUBJECTIVE:   SUBJECTIVE STATEMENT: Patient reports that she feels tired today because she pushed mowed her yard yesterday.   PERTINENT HISTORY: Diabetes type 1, osteoporosis, and allergies PAIN:  Are you having pain? No  PRECAUTIONS: Fall  RED FLAGS: None   WEIGHT BEARING  RESTRICTIONS: No  FALLS:  Has patient fallen in last 6 months? Yes. Number of falls 1  LIVING ENVIRONMENT: Lives with: lives with their family Lives in: House/apartment Stairs: Yes: Internal: 1 flight steps; can reach both and External: "a few" steps; none Has following equipment at home: Single point cane  OCCUPATION: retired  PLOF: Independent  PATIENT GOALS: be able to walk safely  NEXT MD VISIT: 10/26/22  OBJECTIVE:   COGNITION: Overall cognitive status: Within functional limits for tasks assessed     SENSATION: Patient reports no numbness or tingling  LOWER EXTREMITY ROM: WFL for activities assessed  LOWER EXTREMITY MMT:  MMT Right eval Left eval  Hip flexion 4/5 4-/5; left knee pain   Hip extension    Hip abduction    Hip adduction    Hip internal rotation    Hip external rotation    Knee flexion 3+/5 4-/5  Knee extension 4+/5 4/5  Ankle dorsiflexion 4/5 4/5  Ankle plantarflexion    Ankle inversion    Ankle eversion     (Blank rows = not tested)  FUNCTIONAL TESTS:  5 times sit to stand: 17.42 seconds without upper extremity support Timed up and go (TUG): 18.45 seconds without an assistive device  BALANCE:  Narrow BOS, firm surface, eyes open: 30 seconds   Narrow BOS, firm surface, eyes closed: 30 seconds  Tandem, firm surface, eyes open: 3 seconds bilaterally   GAIT: Assistive device utilized: Single point cane Level of assistance: Modified independence Comments: decreased gait speed and knee flexion in stance phase bilaterally   TODAY'S TREATMENT:                                                                                                                              DATE:                                     11/09/22 EXERCISE LOG  Exercise Repetitions and Resistance Comments  Nustep  L4 x 16 minutes    Marches Airex x 2.5 mins   Toe Taps  8" box x 3 mins   Rockerboard 5 mins   LAQ 5# x 2.5 minutes    Marching  5# x 2.5 minutes     Standing HS curl  5# x 2 minutes    Seated hip ADD  x 3 minutes   Seated clams  Green t-band x 3 minutes    STS  X 15 reps no UE    Heel/Toe Raises  X2 mins    Blank cell = exercise not performed today                                    PATIENT EDUCATION:  Education details: POC, prognosis, objective findings, and goals for therapy Person educated: Patient Education method: Explanation Education comprehension: verbalized understanding  HOME EXERCISE PROGRAM:   ASSESSMENT:  CLINICAL IMPRESSION: Pt arrives for today's treatment session denying any pain.  Pt reports being tired today due to push mowing her yard yesterday.  Pt able to tolerate increased time or reps with all exercises today without complaint of discomfort or pain.  Pt given seated rest breaks as needed due to fatigue, which are fewer in frequency.  Pt denied any pain at completion of today's treatment session.  OBJECTIVE IMPAIRMENTS: Abnormal gait, decreased activity tolerance, decreased balance, decreased mobility, difficulty walking, decreased strength, and pain.   ACTIVITY LIMITATIONS: carrying, standing, stairs, and locomotion level  PARTICIPATION LIMITATIONS: meal prep, cleaning, laundry, and shopping  PERSONAL FACTORS: Age and 3+ comorbidities: Diabetes type 1, osteoporosis, and allergies  are also affecting patient's functional outcome.   REHAB POTENTIAL: Good  CLINICAL DECISION MAKING: Evolving/moderate complexity  EVALUATION COMPLEXITY: Moderate   GOALS: Goals reviewed with patient? Yes  LONG TERM GOALS: Target date: 11/17/22  Patient will be independent with her HEP.  Baseline:  Goal status: IN PROGRESS  2.  Patient will improve her five time sit to stand time to 12 seconds or less for a reduced fall risk. Baseline: 10.03 seconds Goal status: MET  3.  Patient will improve her timed up and go time to 12 seconds or less for reduced  fall risk.  Baseline: 8/26: 10.5 seconds Goal status: MET  4.   Patient will report being able to stand for at least 25 minutes for improved function with her household activities.  Baseline: 8/26: 10-15 minutes Goal status: IN PROGRESS  PLAN:  PT FREQUENCY: 2x/week  PT DURATION: 4 weeks  PLANNED INTERVENTIONS: Therapeutic exercises, Therapeutic activity, Neuromuscular re-education, Balance training, Gait training, Patient/Family education, Self Care, Joint mobilization, Stair training, and Re-evaluation  PLAN FOR NEXT SESSION: nustep, lower extremity strengthening, and balance interventions   Newman Pies, PTA 11/09/2022, 12:08 PM

## 2022-11-09 NOTE — Telephone Encounter (Signed)
Patient has appt with [facility] on 11-15-2022 and is supposed to get prolia injection and medication hadn't been called in. Please call patient.

## 2022-11-15 ENCOUNTER — Other Ambulatory Visit: Payer: Medicare HMO

## 2022-11-15 ENCOUNTER — Other Ambulatory Visit: Payer: Self-pay | Admitting: Family Medicine

## 2022-11-15 ENCOUNTER — Ambulatory Visit: Payer: Medicare HMO | Admitting: Family Medicine

## 2022-11-15 DIAGNOSIS — Z780 Asymptomatic menopausal state: Secondary | ICD-10-CM

## 2022-11-16 ENCOUNTER — Ambulatory Visit: Payer: Medicare HMO

## 2022-11-16 DIAGNOSIS — Z9181 History of falling: Secondary | ICD-10-CM

## 2022-11-16 DIAGNOSIS — M6281 Muscle weakness (generalized): Secondary | ICD-10-CM | POA: Diagnosis not present

## 2022-11-16 NOTE — Therapy (Addendum)
OUTPATIENT PHYSICAL THERAPY LOWER EXTREMITY TREATMENT   Patient Name: Audrey [name] MRN: [id number] DOB:1945/08/18, 77 y.o., female Today's Date: 11/16/2022  END OF SESSION:  PT End of Session - 11/16/22 1021     Visit Number 8    Number of Visits 8    Date for PT Re-Evaluation 12/03/22    PT Start Time 1015    PT Stop Time 1059    PT Time Calculation (min) 44 min    Activity Tolerance Patient tolerated treatment well    Behavior During Therapy [facility] for tasks assessed/performed               Past Medical History:  Diagnosis Date   Diabetes mellitus (HCC)    Hyperlipidemia    Osteopenia    2002 T-Score -2   Pericardial effusion    PONV (postoperative nausea and vomiting)    Seasonal allergies    Past Surgical History:  Procedure Laterality Date   rectal polyps removed     right wrist fx     SUBXYPHOID PERICARDIAL WINDOW  02/18/2012   Procedure: SUBXYPHOID PERICARDIAL WINDOW;  Surgeon: Kerin Perna, MD;  Location: [facility] OR;  Service: Thoracic;  Laterality: N/A;  TEE   Patient Active Problem List   Diagnosis Date Noted   Type 1 diabetes mellitus without complications (HCC) 04/02/2020   Osteoporosis 03/27/2018   Ingrowing toenail 10/19/2017   Left elbow pain 11/15/2013   S/P pericardial surgery 02/20/2012   Second degree AV block, Mobitz type I 02/18/2012   Pericardial effusion 02/16/2012   Seasonal allergies    Osteopenia    REFERRING PROVIDER: Mechele Claude, MD   REFERRING DIAG: Fall, initial encounter; Unsteady gait   THERAPY DIAG:  Muscle weakness (generalized)  History of falling  Rationale for Evaluation and Treatment: Rehabilitation  ONSET DATE: 2-3 weeks ago  SUBJECTIVE:   SUBJECTIVE STATEMENT: Pt reports feeling good today.   PERTINENT HISTORY: Diabetes type 1, osteoporosis, and allergies PAIN:  Are you having pain? No  PRECAUTIONS: Fall  RED FLAGS: None   WEIGHT BEARING RESTRICTIONS: No  FALLS:  Has patient fallen in last 6  months? Yes. Number of falls 1  LIVING ENVIRONMENT: Lives with: lives with their family Lives in: House/apartment Stairs: Yes: Internal: 1 flight steps; can reach both and External: "a few" steps; none Has following equipment at home: Single point cane  OCCUPATION: retired  PLOF: Independent  PATIENT GOALS: be able to walk safely  NEXT MD VISIT: 10/26/22  OBJECTIVE:   COGNITION: Overall cognitive status: Within functional limits for tasks assessed     SENSATION: Patient reports no numbness or tingling  LOWER EXTREMITY ROM: WFL for activities assessed  LOWER EXTREMITY MMT:  MMT Right eval Left eval  Hip flexion 4/5 4-/5; left knee pain   Hip extension    Hip abduction    Hip adduction    Hip internal rotation    Hip external rotation    Knee flexion 3+/5 4-/5  Knee extension 4+/5 4/5  Ankle dorsiflexion 4/5 4/5  Ankle plantarflexion    Ankle inversion    Ankle eversion     (Blank rows = not tested)  FUNCTIONAL TESTS:  5 times sit to stand: 17.42 seconds without upper extremity support Timed up and go (TUG): 18.45 seconds without an assistive device  BALANCE:  Narrow BOS, firm surface, eyes open: 30 seconds   Narrow BOS, firm surface, eyes closed: 30 seconds  Tandem, firm surface, eyes open: 3 seconds bilaterally  GAIT: Assistive device utilized: Single point cane Level of assistance: Modified independence Comments: decreased gait speed and knee flexion in stance phase bilaterally   TODAY'S TREATMENT:                                                                                                                              DATE:                                     11/16/22 EXERCISE LOG  Exercise Repetitions and Resistance Comments  Nustep  L4 x 16 minutes    Marches Airex x 3 mins   Toe Taps  8" box x 3.5 mins   Rockerboard 5 mins   LAQ 5# x 3 minutes    Marching  5# x 3 minutes    Standing HS curl     Seated hip ADD  x 3 minutes   Seated clams   Green t-band x 3 minutes    STS  X 15 reps no UE    Heel/Toe Raises      Blank cell = exercise not performed today                                    PATIENT EDUCATION:  Education details: POC, prognosis, objective findings, and goals for therapy Person educated: Patient Education method: Explanation Education comprehension: verbalized understanding  HOME EXERCISE PROGRAM:   ASSESSMENT:  CLINICAL IMPRESSION: Pt arrives for today's treatment session denying any pain.  Pt reports that she feel much stronger since beginning physical therapy.  She states that she can tolerate standing greater than 30 mins before requiring a seated rest break. Pt has met all of the goals set forth for her by physical therapy at this time.  Pt encouraged to stay as active as possible while always keeping safety in mind.  Pt also encouraged to call the facility with any questions or concerns.  Pt denied any pain at completion of today's treatment session.  Pt ready for discharge at this time.  PHYSICAL THERAPY DISCHARGE SUMMARY  Visits from Start of Care: 8  Current functional level related to goals / functional outcomes: Patient was able to meet all of her goals for skilled physical therapy.    Remaining deficits: None    Education / Equipment: HEP    Patient agrees to discharge. Patient goals were met. Patient is being discharged due to meeting the stated rehab goals.  Candi Leash, PT, DPT    OBJECTIVE IMPAIRMENTS: Abnormal gait, decreased activity tolerance, decreased balance, decreased mobility, difficulty walking, decreased strength, and pain.   ACTIVITY LIMITATIONS: carrying, standing, stairs, and locomotion level  PARTICIPATION LIMITATIONS: meal prep, cleaning, laundry, and shopping  PERSONAL FACTORS: Age and 3+ comorbidities: Diabetes type 1, osteoporosis, and allergies  are also  affecting patient's functional outcome.   REHAB POTENTIAL: Good  CLINICAL DECISION MAKING:  Evolving/moderate complexity  EVALUATION COMPLEXITY: Moderate   GOALS: Goals reviewed with patient? Yes  LONG TERM GOALS: Target date: 11/17/22  Patient will be independent with her HEP.  Baseline:  Goal status: MET  2.  Patient will improve her five time sit to stand time to 12 seconds or less for a reduced fall risk. Baseline: 10.03 seconds Goal status: MET  3.  Patient will improve her timed up and go time to 12 seconds or less for reduced fall risk.  Baseline: 8/26: 10.5 seconds Goal status: MET  4.  Patient will report being able to stand for at least 25 minutes for improved function with her household activities.  Baseline: 8/26: 10-15 minutes; 9/10: over 30 mins Goal status: MET  PLAN:  PT FREQUENCY: 2x/week  PT DURATION: 4 weeks  PLANNED INTERVENTIONS: Therapeutic exercises, Therapeutic activity, Neuromuscular re-education, Balance training, Gait training, Patient/Family education, Self Care, Joint mobilization, Stair training, and Re-evaluation  PLAN FOR NEXT SESSION: nustep, lower extremity strengthening, and balance interventions   Newman Pies, PTA 11/16/2022, 11:59 AM

## 2022-11-22 ENCOUNTER — Ambulatory Visit: Payer: Medicare HMO | Admitting: Internal Medicine

## 2022-11-22 ENCOUNTER — Encounter: Payer: Self-pay | Admitting: Family Medicine

## 2022-11-22 ENCOUNTER — Ambulatory Visit (INDEPENDENT_AMBULATORY_CARE_PROVIDER_SITE_OTHER): Payer: Medicare HMO

## 2022-11-22 ENCOUNTER — Ambulatory Visit (INDEPENDENT_AMBULATORY_CARE_PROVIDER_SITE_OTHER): Payer: Medicare HMO | Admitting: Family Medicine

## 2022-11-22 VITALS — BP 105/54 | HR 77 | Temp 98.1°F | Ht 64.0 in | Wt 108.2 lb

## 2022-11-22 DIAGNOSIS — E870 Hyperosmolality and hypernatremia: Secondary | ICD-10-CM | POA: Diagnosis not present

## 2022-11-22 DIAGNOSIS — E1069 Type 1 diabetes mellitus with other specified complication: Secondary | ICD-10-CM

## 2022-11-22 DIAGNOSIS — M810 Age-related osteoporosis without current pathological fracture: Secondary | ICD-10-CM | POA: Diagnosis not present

## 2022-11-22 DIAGNOSIS — Z23 Encounter for immunization: Secondary | ICD-10-CM | POA: Diagnosis not present

## 2022-11-22 DIAGNOSIS — Z780 Asymptomatic menopausal state: Secondary | ICD-10-CM | POA: Diagnosis not present

## 2022-11-22 DIAGNOSIS — E1065 Type 1 diabetes mellitus with hyperglycemia: Secondary | ICD-10-CM | POA: Diagnosis not present

## 2022-11-22 MED ORDER — TRESIBA FLEXTOUCH 100 UNIT/ML ~~LOC~~ SOPN: 10.0000 [IU] | PEN_INJECTOR | Freq: Every day | SUBCUTANEOUS | Status: DC

## 2022-11-22 NOTE — Progress Notes (Unsigned)
Subjective:  Patient ID: Audrey [name], female    DOB: [birth year]  Age: 77 y.o. MRN: [id number]  CC: Diabetes   HPI Audrey [name] presents for recheck of SSI. Several lows, mostly fasting Frequent highs later in the day. Brings in log showing AM glucose running 55-120 with a few outliers over 300. Then At lunch and dinner most readings are 200-350. Log scanned in .     11/22/2022    2:11 PM 10/26/2022    9:37 AM 10/12/2022    8:41 AM  Depression screen PHQ 2/9  Decreased Interest 0 0 1  Down, Depressed, Hopeless 0 0 0  PHQ - 2 Score 0 0 1  Altered sleeping   2  Tired, decreased energy   3  Change in appetite   1  Feeling bad or failure about yourself    0  Trouble concentrating   0  Moving slowly or fidgety/restless   2  Suicidal thoughts   0  PHQ-9 Score   9  Difficult doing work/chores   Somewhat difficult    History Audrey [name] has a past medical history of Diabetes mellitus (HCC), Hyperlipidemia, Osteopenia, Pericardial effusion, PONV (postoperative nausea and vomiting), and Seasonal allergies.   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   Her family history includes Cancer in her sister; Diabetes in her brother; Heart attack in her father; Heart disease in her brother.Audrey [name] reports that Audrey [name] has never smoked. Audrey [name] has never used smokeless tobacco. Audrey [name] reports that Audrey [name] does not drink alcohol and does not use drugs.    ROS Review of Systems  Objective:  BP (!) 105/54   Pulse 77   Temp 98.1 F (36.7 C)   Ht 5\' 4"  (1.626 m)   Wt 108 lb 3.2 oz (49.1 kg)   SpO2 97%   BMI 18.57 kg/m   BP Readings from Last 3 Encounters:  11/22/22 (!) 105/54  10/26/22 (!) 100/57  10/12/22 (!) 98/56    Wt Readings from Last 3 Encounters:  11/22/22 108 lb 3.2 oz (49.1 kg)  10/26/22 107 lb 6.4 oz (48.7 kg)  10/12/22 102 lb 12.8 oz (46.6 kg)     Physical Exam    Assessment & Plan:   There  are no diagnoses linked to this encounter.     I am having Audrey [name] maintain her aspirin, Calcium Carb-Cholecalciferol, TURMERIC PO, multivitamin-lutein, ascorbic acid, onetouch ultrasoft, NovoLOG FlexPen, FreeStyle Libre 2 Sensor, ondansetron, alendronate, Magnesium, Vitamin D-3, loratadine, Melatonin, sertraline, Insulin Pen Needle, Tresiba FlexTouch, lisinopril, meloxicam, and pravastatin.  Allergies as of 11/22/2022       Reactions   Morphine And Codeine    Januvia [sitagliptin]         Medication List        Accurate as of November 22, 2022  2:50 PM. If you have any questions, ask your nurse or doctor.          alendronate 70 MG tablet Commonly known as: FOSAMAX Take 1 tablet (70 mg total) by mouth every 7 (seven) days. Take with a full glass of water on an empty stomach.   Allergy Relief 10 MG tablet Generic drug: loratadine Take 10 mg by mouth daily.   ascorbic acid 500 MG tablet Commonly known as: VITAMIN C Take 500 mg by mouth daily.   aspirin 81 MG tablet Take 81 mg by mouth every other day.   Calcium Carb-Cholecalciferol  600-800 MG-UNIT Tabs Take by mouth.   FreeStyle Libre 2 Sensor Misc Change every 14 days   Insulin Pen Needle 32G X 4 MM Misc Use 4x a day   lisinopril 5 MG tablet Commonly known as: ZESTRIL Take 1 tablet (5 mg total) by mouth every other day.   Magnesium 250 MG Tabs Take 250 mg by mouth daily.   Melatonin 10 MG Tabs Take 10 mg by mouth at bedtime.   meloxicam 15 MG tablet Commonly known as: MOBIC Take 1 tablet (15 mg total) by mouth daily.   multivitamin-lutein Caps capsule Take 1 capsule by mouth daily.   NovoLOG FlexPen 100 UNIT/ML FlexPen Generic drug: insulin aspart Max daily 30 units   ondansetron 4 MG disintegrating tablet Commonly known as: ZOFRAN-ODT Take 1 tablet (4 mg total) by mouth every 8 (eight) hours as needed for nausea or vomiting.   onetouch ultrasoft lancets 1 each by Other route 4  (four) times daily. E11.9   pravastatin 80 MG tablet Commonly known as: PRAVACHOL Take 1 tablet (80 mg total) by mouth daily.   sertraline 50 MG tablet Commonly known as: ZOLOFT Take 50 mg by mouth daily.   Evaristo Bury FlexTouch 100 UNIT/ML FlexTouch Pen Generic drug: insulin degludec Inject 12 Units into the skin daily.   TURMERIC PO Take by mouth.   Vitamin D-3 125 MCG (5000 UT) Tabs Take 125 mcg by mouth daily.         Follow-up: No follow-ups on file.  Mechele Claude, M.D.

## 2022-11-22 NOTE — Patient Instructions (Addendum)
  Sliding scale insulin with Novolog        Breakfast and     Dinner        Lunch    0-100      take   0 units  0 units  101-140  take   2 units  0 units  141-200  take   3 units  2 units  201- 250 take   4 units  3 units  251-300  take   5 units  4 units   301- 350 take   6 units  5 units  Over 350  7 units  7 units

## 2022-11-24 ENCOUNTER — Encounter: Payer: Self-pay | Admitting: Family Medicine

## 2022-11-27 NOTE — Telephone Encounter (Signed)
Pt archived in [email or web address].  Please advise if patient and/or provider wish to proceed with Prolia therpay.

## 2022-12-07 NOTE — Telephone Encounter (Signed)
Can we retry patient now for prolia please

## 2022-12-09 ENCOUNTER — Encounter: Payer: Self-pay | Admitting: Family Medicine

## 2022-12-09 ENCOUNTER — Ambulatory Visit (INDEPENDENT_AMBULATORY_CARE_PROVIDER_SITE_OTHER): Payer: Medicare HMO | Admitting: Family Medicine

## 2022-12-09 VITALS — BP 109/62 | HR 80 | Temp 97.7°F | Ht 64.0 in | Wt 108.4 lb

## 2022-12-09 DIAGNOSIS — E1065 Type 1 diabetes mellitus with hyperglycemia: Secondary | ICD-10-CM | POA: Diagnosis not present

## 2022-12-09 DIAGNOSIS — E1069 Type 1 diabetes mellitus with other specified complication: Secondary | ICD-10-CM | POA: Diagnosis not present

## 2022-12-09 DIAGNOSIS — E870 Hyperosmolality and hypernatremia: Secondary | ICD-10-CM

## 2022-12-09 NOTE — Patient Instructions (Addendum)
  Sliding scale insulin with Novolog                            Breakfast and             Dinner                                     Lunch     0-100      take              0 units              0 units   101-140  take              2 units              0 units   141-200  take               3 units             2 units   201- 250 take              4 units              3 units   251-300  take              5 units              4 units              301- 350 take               6 units             5 units   Over 350                     7 units              7 units  At each meal. Estimate the total carbohydrate contained in the food. For every 20 grams of carbohydrate add one extra unit of insuln to your shot for that meal.  Or you can divide carbs/20 = units of insulin to add to your shot.  To estimate carbohydrate you will need to purchase a book. There are numerous available. It should be a guide to how many carbs you eat with your foods.    INCREASE the TRESIBA to 12 units a day.

## 2022-12-12 ENCOUNTER — Encounter: Payer: Self-pay | Admitting: Family Medicine

## 2022-12-12 NOTE — Progress Notes (Signed)
Chief Complaint  Patient presents with   Follow-up    HPI  Patient presents today for follow up of her diabetes. She is Type one and has been on a sliding scale. She brough in a detailed log for review. She still has some fairly wide swings. Lows to 80. Highs up to 350. Some readings show inconsistent response to the SSI. She is not monitoring carbs closely at this time.   PMH: Smoking status noted ROS: Per HPI  Objective: BP 109/62   Pulse 80   Temp 97.7 F (36.5 C)   Ht 5\' 4"  (1.626 m)   Wt 108 lb 6.4 oz (49.2 kg)   SpO2 98%   BMI 18.61 kg/m  Gen: NAD, alert, cooperative with exam HEENT: NCAT, EOMI, PERRL CV: RRR, good S1/S2, no murmur Resp: CTABL, no wheezes, non-labored Neuro: Alert and oriented, No gross deficits  Assessment and plan:  1. DM hyperosmolarity type I, uncontrolled (HCC)     Sliding scale insulin with Novolog                            Breakfast and             Dinner                                     Lunch     0-100      take              0 units              0 units   101-140  take              2 units              0 units   141-200  take               3 units             2 units   201- 250 take              4 units              3 units   251-300  take              5 units              4 units              301- 350 take               6 units             5 units   Over 350                     7 units              7 units  At each meal. Estimate the total carbohydrate contained in the food. For every 20 grams of carbohydrate add one extra unit of insuln to your shot for that meal.  Or you can divide carbs/20 = units of insulin to add to your shot.     Follow up two weeks  Mechele Claude, MD

## 2022-12-13 ENCOUNTER — Other Ambulatory Visit (HOSPITAL_COMMUNITY): Payer: Self-pay

## 2022-12-13 NOTE — Telephone Encounter (Signed)
Pt ready for scheduling for PROLIA on or after : 12/13/22  Out-of-pocket cost due at time of visit: $365  Primary: [employer] Prolia co-insurance: 20% Admin fee co-insurance: $45  Secondary: --- Prolia co-insurance:  Admin fee co-insurance:   Medical Benefit Details: Date Benefits were checked: 09/13/22 Deductible: NO/ Coinsurance: 20%/ Admin Fee: $45  Prior Auth: APPROVED PA# [id number] Expiration Date: 10/07/22-10/07/23  # of doses approved: 2  Pharmacy benefit: Copay $100 If patient wants fill through the pharmacy benefit please send prescription to: [employer], and include estimated need by date in rx notes. Pharmacy will ship medication directly to the office.  Patient NOT eligible for Prolia Copay Card. Copay Card can make patient's cost as little as $25. Link to apply: [email or web address]  ** This summary of benefits is an estimation of the patient's out-of-pocket cost. Exact cost may very based on individual plan coverage.

## 2022-12-28 DIAGNOSIS — Z8249 Family history of ischemic heart disease and other diseases of the circulatory system: Secondary | ICD-10-CM | POA: Diagnosis not present

## 2022-12-28 DIAGNOSIS — M1990 Unspecified osteoarthritis, unspecified site: Secondary | ICD-10-CM | POA: Diagnosis not present

## 2022-12-28 DIAGNOSIS — E1122 Type 2 diabetes mellitus with diabetic chronic kidney disease: Secondary | ICD-10-CM | POA: Diagnosis not present

## 2022-12-28 DIAGNOSIS — M810 Age-related osteoporosis without current pathological fracture: Secondary | ICD-10-CM | POA: Diagnosis not present

## 2022-12-28 DIAGNOSIS — K5900 Constipation, unspecified: Secondary | ICD-10-CM | POA: Diagnosis not present

## 2022-12-28 DIAGNOSIS — J309 Allergic rhinitis, unspecified: Secondary | ICD-10-CM | POA: Diagnosis not present

## 2022-12-28 DIAGNOSIS — R636 Underweight: Secondary | ICD-10-CM | POA: Diagnosis not present

## 2022-12-28 DIAGNOSIS — F325 Major depressive disorder, single episode, in full remission: Secondary | ICD-10-CM | POA: Diagnosis not present

## 2022-12-28 DIAGNOSIS — Z008 Encounter for other general examination: Secondary | ICD-10-CM | POA: Diagnosis not present

## 2022-12-28 DIAGNOSIS — Z794 Long term (current) use of insulin: Secondary | ICD-10-CM | POA: Diagnosis not present

## 2022-12-28 DIAGNOSIS — N182 Chronic kidney disease, stage 2 (mild): Secondary | ICD-10-CM | POA: Diagnosis not present

## 2022-12-28 DIAGNOSIS — E785 Hyperlipidemia, unspecified: Secondary | ICD-10-CM | POA: Diagnosis not present

## 2022-12-28 DIAGNOSIS — F419 Anxiety disorder, unspecified: Secondary | ICD-10-CM | POA: Diagnosis not present

## 2023-01-05 DIAGNOSIS — E119 Type 2 diabetes mellitus without complications: Secondary | ICD-10-CM | POA: Diagnosis not present

## 2023-01-11 ENCOUNTER — Ambulatory Visit (INDEPENDENT_AMBULATORY_CARE_PROVIDER_SITE_OTHER): Payer: Medicare HMO | Admitting: Family Medicine

## 2023-01-11 ENCOUNTER — Encounter: Payer: Self-pay | Admitting: Family Medicine

## 2023-01-11 VITALS — BP 106/58 | HR 80 | Temp 98.0°F | Ht 64.0 in | Wt 108.4 lb

## 2023-01-11 DIAGNOSIS — E1065 Type 1 diabetes mellitus with hyperglycemia: Secondary | ICD-10-CM

## 2023-01-11 DIAGNOSIS — E1069 Type 1 diabetes mellitus with other specified complication: Secondary | ICD-10-CM | POA: Diagnosis not present

## 2023-01-11 DIAGNOSIS — Z13220 Encounter for screening for lipoid disorders: Secondary | ICD-10-CM

## 2023-01-11 DIAGNOSIS — E870 Hyperosmolality and hypernatremia: Secondary | ICD-10-CM

## 2023-01-11 LAB — BAYER DCA HB A1C WAIVED: HB A1C (BAYER DCA - WAIVED): 7.7 % — ABNORMAL HIGH (ref 4.8–5.6)

## 2023-01-11 MED ORDER — TRESIBA FLEXTOUCH 100 UNIT/ML ~~LOC~~ SOPN: 12.0000 [IU] | PEN_INJECTOR | Freq: Every day | SUBCUTANEOUS | Status: DC

## 2023-01-11 NOTE — Patient Instructions (Signed)
Tresiba 12 units daily at 8 PM   Sliding scale insulin with Novolog                            Breakfast and             Dinner                                     Lunch     0-100      take              0 units              0 units   101-140  take              1 units              0 units   141-200  take               2 units             2 units   201- 250 take              3 units              3 units   251-300  take              4 units              4 units              301- 350 take               5 units             5 units   Over 350                     7 units              7 units   At each meal. Estimate the total carbohydrate contained in the food. For every 25 grams of carbohydrate add one extra unit of insuln to your shot for that meal.   Or you can divide carbs/25 = units of insulin to add to your shot.

## 2023-01-11 NOTE — Progress Notes (Signed)
Subjective:  Patient ID: Audrey [name], female    DOB: 10/13/1945  Age: 77 y.o. MRN: [id number]  CC: Medical Management of Chronic Issues   HPI Audrey [name] presents forFollow-up of diabetes. Patient checks blood sugar at home.  Using TID SSI. Log attached.  Patient denies symptoms such as polyuria, polydipsia, excessive hunger, nausea No significant hypoglycemic spells noted. Medications reviewed. Pt reports taking them regularly without complication/adverse reaction being reported today.    History Audrey [name] has a past medical history of Diabetes mellitus (HCC), Hyperlipidemia, Osteopenia, Pericardial effusion, PONV (postoperative nausea and vomiting), and Seasonal allergies.   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   Her family history includes Cancer in her sister; Diabetes in her brother; Heart attack in her father; Heart disease in her brother.She reports that she has never smoked. She has never used smokeless tobacco. She reports that she does not drink alcohol and does not use drugs.  Current Outpatient Medications on File Prior to Visit  Medication Sig Dispense Refill   alendronate (FOSAMAX) 70 MG tablet Take 1 tablet (70 mg total) by mouth every 7 (seven) days. Take with a full glass of water on an empty stomach. 12 tablet 3   aspirin 81 MG tablet Take 81 mg by mouth every other day.      Calcium Carb-Cholecalciferol 600-800 MG-UNIT TABS Take by mouth.     Cholecalciferol (VITAMIN D-3) 125 MCG (5000 UT) TABS Take 125 mcg by mouth daily.     Continuous Glucose Sensor (FREESTYLE LIBRE 2 SENSOR) MISC Change every 14 days 6 each 3   insulin aspart (NOVOLOG FLEXPEN) 100 UNIT/ML FlexPen Max daily 30 units 30 mL 3   Insulin Pen Needle 32G X 4 MM MISC Use 4x a day 100 each 3   Lancets (ONETOUCH ULTRASOFT) lancets 1 each by Other route 4 (four) times daily. E11.9 400 each 0   lisinopril  (ZESTRIL) 5 MG tablet Take 1 tablet (5 mg total) by mouth every other day. 45 tablet 3   loratadine (ALLERGY RELIEF) 10 MG tablet Take 10 mg by mouth daily.     Magnesium 250 MG TABS Take 250 mg by mouth daily.     Melatonin 10 MG TABS Take 10 mg by mouth at bedtime.     meloxicam (MOBIC) 15 MG tablet Take 1 tablet (15 mg total) by mouth daily. 90 tablet 1   multivitamin-lutein (OCUVITE-LUTEIN) CAPS capsule Take 1 capsule by mouth daily.     ondansetron (ZOFRAN-ODT) 4 MG disintegrating tablet Take 1 tablet (4 mg total) by mouth every 8 (eight) hours as needed for nausea or vomiting. 12 tablet 0   pravastatin (PRAVACHOL) 80 MG tablet Take 1 tablet (80 mg total) by mouth daily. 90 tablet 3   sertraline (ZOLOFT) 50 MG tablet Take 50 mg by mouth daily.     TURMERIC PO Take by mouth.     vitamin C (ASCORBIC ACID) 500 MG tablet Take 500 mg by mouth daily.     No current facility-administered medications on file prior to visit.    ROS Review of Systems  Constitutional: Negative.   HENT: Negative.    Eyes:  Negative for visual disturbance.  Respiratory:  Negative for shortness of breath.   Cardiovascular:  Negative for chest pain.  Gastrointestinal:  Negative for abdominal pain.  Musculoskeletal:  Negative for arthralgias.    Objective:  BP (!) 106/58   Pulse  80   Temp 98 F (36.7 C)   Ht 5\' 4"  (1.626 m)   Wt 108 lb 6.4 oz (49.2 kg)   SpO2 97%   BMI 18.61 kg/m   BP Readings from Last 3 Encounters:  01/11/23 (!) 106/58  12/09/22 109/62  11/22/22 (!) 105/54    Wt Readings from Last 3 Encounters:  01/11/23 108 lb 6.4 oz (49.2 kg)  12/09/22 108 lb 6.4 oz (49.2 kg)  11/22/22 108 lb 3.2 oz (49.1 kg)     Physical Exam Constitutional:      General: She is not in acute distress.    Appearance: She is well-developed.  Cardiovascular:     Rate and Rhythm: Normal rate and regular rhythm.  Pulmonary:     Breath sounds: Normal breath sounds.  Musculoskeletal:        General:  Normal range of motion.  Skin:    General: Skin is warm and dry.  Neurological:     Mental Status: She is alert and oriented to person, place, and time.       Assessment & Plan:   Audrey [name] was seen today for medical management of chronic issues.  Diagnoses and all orders for this visit:  DM (diabetes mellitus), type 1, uncontrolled, with hyperosmolarity (HCC) -     Bayer DCA Hb A1c Waived -     CBC with Differential/Platelet -     CMP14+EGFR  Lipid screening  Other orders -     insulin degludec (TRESIBA FLEXTOUCH) 100 UNIT/ML FlexTouch Pen; Inject 12 Units into the skin daily.      Sliding scale insulin with Novolog                            Breakfast and             Dinner                                     Lunch     0-100      take              0 units              0 units   101-140  take              1 units              0 units   141-200  take               2 units             2 units   201- 250 take              3 units              3 units   251-300  take              4 units              4 units              301- 350 take               5 units             5 units   Over 350  7 units              7 units   At each meal. Estimate the total carbohydrate contained in the food. For every 25 grams of carbohydrate add one extra unit of insuln to your shot for that meal.   Or you can divide carbs/25 = units of insulin to add to your shot.     I have changed Audrey [name]. Audrey [name] FlexTouch. I am also having her maintain her aspirin, Calcium Carb-Cholecalciferol, TURMERIC PO, multivitamin-lutein, ascorbic acid, onetouch ultrasoft, NovoLOG FlexPen, FreeStyle Libre 2 Sensor, ondansetron, alendronate, Magnesium, Vitamin D-3, loratadine, Melatonin, sertraline, Insulin Pen Needle, lisinopril, meloxicam, and pravastatin.  Meds ordered this encounter  Medications   insulin degludec (TRESIBA FLEXTOUCH) 100 UNIT/ML FlexTouch Pen    Sig: Inject 12  Units into the skin daily.     Follow-up: Return in about 1 month (around 02/10/2023) for diabetes.  Mechele Claude, M.D.

## 2023-01-12 ENCOUNTER — Telehealth: Payer: Self-pay | Admitting: *Deleted

## 2023-01-12 LAB — CMP14+EGFR
ALT: 14 [IU]/L (ref 0–32)
AST: 22 [IU]/L (ref 0–40)
Albumin: 4.2 g/dL (ref 3.8–4.8)
Alkaline Phosphatase: 54 [IU]/L (ref 44–121)
BUN/Creatinine Ratio: 43 — ABNORMAL HIGH (ref 12–28)
BUN: 35 mg/dL — ABNORMAL HIGH (ref 8–27)
Bilirubin Total: <0.2 mg/dL (ref 0.0–1.2)
CO2: 26 mmol/L (ref 20–29)
Calcium: 9.8 mg/dL (ref 8.7–10.3)
Chloride: 105 mmol/L (ref 96–106)
Creatinine, Ser: 0.81 mg/dL (ref 0.57–1.00)
Globulin, Total: 1.9 g/dL (ref 1.5–4.5)
Glucose: 132 mg/dL — ABNORMAL HIGH (ref 70–99)
Potassium: 5 mmol/L (ref 3.5–5.2)
Sodium: 144 mmol/L (ref 134–144)
Total Protein: 6.1 g/dL (ref 6.0–8.5)
eGFR: 75 mL/min/{1.73_m2} (ref >59)

## 2023-01-12 LAB — CBC WITH DIFFERENTIAL/PLATELET
Basophils Absolute: 0 10*3/uL (ref 0.0–0.2)
Basos: 1 %
EOS (ABSOLUTE): 0.2 10*3/uL (ref 0.0–0.4)
Eos: 3 %
Hematocrit: 33.4 % — ABNORMAL LOW (ref 34.0–46.6)
Hemoglobin: 10.5 g/dL — ABNORMAL LOW (ref 11.1–15.9)
Immature Grans (Abs): 0 10*3/uL (ref 0.0–0.1)
Immature Granulocytes: 0 %
Lymphocytes Absolute: 0.8 10*3/uL (ref 0.7–3.1)
Lymphs: 15 %
MCH: 30 pg (ref 26.6–33.0)
MCHC: 31.4 g/dL — ABNORMAL LOW (ref 31.5–35.7)
MCV: 95 fL (ref 79–97)
Monocytes Absolute: 0.4 10*3/uL (ref 0.1–0.9)
Monocytes: 8 %
Neutrophils Absolute: 4 10*3/uL (ref 1.4–7.0)
Neutrophils: 73 %
Platelets: 258 10*3/uL (ref 150–450)
RBC: 3.5 x10E6/uL — ABNORMAL LOW (ref 3.77–5.28)
RDW: 13.6 % (ref 11.7–15.4)
WBC: 5.4 10*3/uL (ref 3.4–10.8)

## 2023-01-12 NOTE — Telephone Encounter (Signed)
Copied from CRM [id number]. Topic: General - Inquiry >> Jan 12, 2023  8:40 AM Audrey [name] H wrote: Reason for CRM: Patient calling in to get A1C results.

## 2023-01-12 NOTE — Telephone Encounter (Signed)
Called and spoke with pt , notified of results

## 2023-01-12 NOTE — Progress Notes (Signed)
Hello Audelia,  Your lab result is normal and/or stable.Some minor variations that are not significant are commonly marked abnormal, but do not represent any medical problem for you.  Best regards, Mechele Claude, M.D.

## 2023-01-19 ENCOUNTER — Other Ambulatory Visit: Payer: Self-pay | Admitting: *Deleted

## 2023-01-19 MED ORDER — SERTRALINE HCL 50 MG PO TABS: 50.0000 mg | ORAL_TABLET | Freq: Every day | ORAL | 3 refills | Status: DC

## 2023-02-08 ENCOUNTER — Telehealth: Payer: Self-pay | Admitting: Nutrition

## 2023-02-08 NOTE — Telephone Encounter (Signed)
Tried contacting patient's son to let them know that I still have her OmniPod5 insulin pump here at the office.  There is to answering machine.  Tried calling home/lpatient.  Patient asked that we keep the pump in case she decides to use it at a later date.

## 2023-02-16 ENCOUNTER — Ambulatory Visit (INDEPENDENT_AMBULATORY_CARE_PROVIDER_SITE_OTHER): Payer: Medicare HMO | Admitting: Family Medicine

## 2023-02-16 ENCOUNTER — Encounter: Payer: Self-pay | Admitting: Family Medicine

## 2023-02-16 VITALS — BP 101/53 | HR 85 | Temp 97.6°F | Ht 64.0 in | Wt 109.8 lb

## 2023-02-16 DIAGNOSIS — Z794 Long term (current) use of insulin: Secondary | ICD-10-CM | POA: Diagnosis not present

## 2023-02-16 DIAGNOSIS — E109 Type 1 diabetes mellitus without complications: Secondary | ICD-10-CM | POA: Diagnosis not present

## 2023-02-16 NOTE — Progress Notes (Signed)
Subjective:  Patient ID: Audrey [name], female    DOB: 1945-12-09  Age: 77 y.o. MRN: [id number]  CC: Diabetes   HPI DOUNIA NOTCH presents for recheck of diabetes sliding scale. She has started adding carb counting to her correction dose. This seems to be bringing her glucose closer to range. Multiple readings, taken QID and Associated doses of insulin were returned in her log. No significant lows, athough a few in the upper sixties were noted. Several over 200 noted.     02/16/2023   10:57 AM 01/11/2023   11:35 AM 12/09/2022    2:30 PM  Depression screen PHQ 2/9  Decreased Interest 0 0 0  Down, Depressed, Hopeless 0 0 0  PHQ - 2 Score 0 0 0  Altered sleeping  1   Tired, decreased energy  1   Change in appetite  2   Feeling bad or failure about yourself   0   Trouble concentrating  0   Moving slowly or fidgety/restless  0   Suicidal thoughts  0   PHQ-9 Score  4   Difficult doing work/chores  Not difficult at all     History Vione has a past medical history of Diabetes mellitus (HCC), Hyperlipidemia, Osteopenia, Pericardial effusion, PONV (postoperative nausea and vomiting), and Seasonal allergies.   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   Her family history includes Cancer in her sister; Diabetes in her brother; Heart attack in her father; Heart disease in her brother.She reports that she has never smoked. She has never used smokeless tobacco. She reports that she does not drink alcohol and does not use drugs.    ROS Review of Systems  Objective:  BP (!) 101/53   Pulse 85   Temp 97.6 F (36.4 C)   Ht 5\' 4"  (1.626 m)   Wt 109 lb 12.8 oz (49.8 kg)   SpO2 97%   BMI 18.85 kg/m   BP Readings from Last 3 Encounters:  02/16/23 (!) 101/53  01/11/23 (!) 106/58  12/09/22 109/62    Wt Readings from Last 3 Encounters:  02/16/23 109 lb 12.8 oz (49.8 kg)  01/11/23 108 lb  6.4 oz (49.2 kg)  12/09/22 108 lb 6.4 oz (49.2 kg)     Physical Exam Constitutional:      General: She is not in acute distress.    Appearance: She is well-developed.  Cardiovascular:     Rate and Rhythm: Normal rate and regular rhythm.  Pulmonary:     Breath sounds: Normal breath sounds.  Musculoskeletal:        General: Normal range of motion.  Skin:    General: Skin is warm and dry.  Neurological:     Mental Status: She is alert and oriented to person, place, and time.       Assessment & Plan:   Tyrae was seen today for diabetes.  Diagnoses and all orders for this visit:  Type 1 diabetes mellitus without complications (HCC)  Encounter for long-term (current) use of insulin (HCC)     liding scale insulin with Novolog                            Breakfast and             Dinner  Lunch     0-100      take              0 units              0 units   101-140  take              1 units              0 units   141-200  take               2 units             2 units   201- 250 take              3 units              4 units   251-300  take              4 units             5 units              301- 350 take               5 units            6 units   Over 350                     7 units              8 units   At each meal. Estimate the total carbohydrate contained in the food. For every 25 grams of carbohydrate add one extra unit of insuln to your shot for that meal.   Or you can divide carbs/25 = units of insulin to add to your shot.  I am having Audrey [name] maintain her aspirin, Calcium Carb-Cholecalciferol, TURMERIC PO, multivitamin-lutein, ascorbic acid, onetouch ultrasoft, NovoLOG FlexPen, FreeStyle Libre 2 Sensor, ondansetron, alendronate, Magnesium, Vitamin D-3, loratadine, Melatonin, Insulin Pen Needle, lisinopril, meloxicam, pravastatin, Evaristo Bury FlexTouch, and sertraline.  Allergies as of 02/16/2023        Reactions   Morphine And Codeine    Januvia [sitagliptin]         Medication List        Accurate as of February 16, 2023 11:59 PM. If you have any questions, ask your nurse or doctor.          alendronate 70 MG tablet Commonly known as: FOSAMAX Take 1 tablet (70 mg total) by mouth every 7 (seven) days. Take with a full glass of water on an empty stomach.   Allergy Relief 10 MG tablet Generic drug: loratadine Take 10 mg by mouth daily.   ascorbic acid 500 MG tablet Commonly known as: VITAMIN C Take 500 mg by mouth daily.   aspirin 81 MG tablet Take 81 mg by mouth every other day.   Calcium Carb-Cholecalciferol 600-800 MG-UNIT Tabs Take by mouth.   FreeStyle Libre 2 Sensor Misc Change every 14 days   Insulin Pen Needle 32G X 4 MM Misc Use 4x a day   lisinopril 5 MG tablet Commonly known as: ZESTRIL Take 1 tablet (5 mg total) by mouth every other day.   Magnesium 250 MG Tabs Take 250 mg by mouth daily.   Melatonin 10 MG Tabs Take 10 mg by mouth at bedtime.   meloxicam 15 MG tablet Commonly known as: MOBIC Take 1 tablet (15 mg total) by  mouth daily.   multivitamin-lutein Caps capsule Take 1 capsule by mouth daily.   NovoLOG FlexPen 100 UNIT/ML FlexPen Generic drug: insulin aspart Max daily 30 units   ondansetron 4 MG disintegrating tablet Commonly known as: ZOFRAN-ODT Take 1 tablet (4 mg total) by mouth every 8 (eight) hours as needed for nausea or vomiting.   onetouch ultrasoft lancets 1 each by Other route 4 (four) times daily. E11.9   pravastatin 80 MG tablet Commonly known as: PRAVACHOL Take 1 tablet (80 mg total) by mouth daily.   sertraline 50 MG tablet Commonly known as: ZOLOFT Take 1 tablet (50 mg total) by mouth daily.   Evaristo Bury FlexTouch 100 UNIT/ML FlexTouch Pen Generic drug: insulin degludec Inject 12 Units into the skin daily.   TURMERIC PO Take by mouth.   Vitamin D-3 125 MCG (5000 UT) Tabs Take 125 mcg by mouth daily.          Follow-up: Return in about 1 month (around 03/19/2023).  Mechele Claude, M.D.

## 2023-02-16 NOTE — Patient Instructions (Signed)
  liding scale insulin with Novolog                            Breakfast and             Dinner                                     Lunch     0-100      take              0 units              0 units   101-140  take              1 units              0 units   141-200  take               2 units             2 units   201- 250 take              3 units              4 units   251-300  take              4 units             5 units              301- 350 take               5 units            6 units   Over 350                     7 units              8 units

## 2023-02-21 NOTE — Telephone Encounter (Signed)
LVM.  I informed patient that we can not keep this pump here, and that I am going to mail it to her.   Boxed pump up and sent to [facility] mail room.

## 2023-03-23 ENCOUNTER — Ambulatory Visit: Payer: Medicare HMO | Admitting: Family Medicine

## 2023-03-25 ENCOUNTER — Telehealth: Payer: Self-pay

## 2023-03-25 NOTE — Telephone Encounter (Signed)
Called and gave verbal 

## 2023-03-25 NOTE — Telephone Encounter (Signed)
Copied from CRM [id number]. Topic: General - Other >> Mar 25, 2023 10:28 AM Louie Casa B wrote: Reason for CRM: [facility] is calling in for a verbal verification  of the patient condition please call [phone]

## 2023-04-01 ENCOUNTER — Ambulatory Visit (INDEPENDENT_AMBULATORY_CARE_PROVIDER_SITE_OTHER): Payer: Medicare HMO | Admitting: Family Medicine

## 2023-04-01 ENCOUNTER — Encounter: Payer: Self-pay | Admitting: Family Medicine

## 2023-04-01 VITALS — BP 123/61 | HR 69 | Wt 114.0 lb

## 2023-04-01 DIAGNOSIS — E109 Type 1 diabetes mellitus without complications: Secondary | ICD-10-CM

## 2023-04-01 DIAGNOSIS — Z23 Encounter for immunization: Secondary | ICD-10-CM

## 2023-04-01 MED ORDER — NOVOLOG FLEXPEN 100 UNIT/ML ~~LOC~~ SOPN
4.0000 [IU] | PEN_INJECTOR | Freq: Three times a day (TID) | SUBCUTANEOUS | 3 refills | Status: DC
Start: 2023-04-01 — End: 2023-06-16

## 2023-04-01 MED ORDER — TRESIBA FLEXTOUCH 100 UNIT/ML ~~LOC~~ SOPN: 16.0000 [IU] | PEN_INJECTOR | Freq: Every day | SUBCUTANEOUS | 3 refills | Status: DC

## 2023-04-01 NOTE — Progress Notes (Signed)
Subjective:  Patient ID: Audrey [name], female    DOB: [birth year]  Age: 78 y.o. MRN: [id number]  CC: Diabetes   HPI Audrey [name] presents for follow up of sliding scale insulin. Can't understand how to count carbs for insulin dosing. Having lows and highs and eating between meals without counting those carbs. Logs reviewed. Show multiple lows and highs. Also several gaps. Very difficult to estavlish a pattern.     04/01/2023   10:45 AM 02/16/2023   10:57 AM 01/11/2023   11:35 AM  Depression screen PHQ 2/9  Decreased Interest 0 0 0  Down, Depressed, Hopeless 0 0 0  PHQ - 2 Score 0 0 0  Altered sleeping 0  1  Tired, decreased energy 0  1  Change in appetite 0  2  Feeling bad or failure about yourself  0  0  Trouble concentrating 0  0  Moving slowly or fidgety/restless 0  0  Suicidal thoughts 0  0  PHQ-9 Score 0  4  Difficult doing work/chores Not difficult at all  Not difficult at all    History Audrey [name] has a past medical history of Diabetes mellitus (HCC), Hyperlipidemia, Osteopenia, Pericardial effusion, PONV (postoperative nausea and vomiting), and Seasonal allergies.   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   Her family history includes Cancer in her sister; Diabetes in her brother; Heart attack in her father; Heart disease in her brother.Audrey [name] reports that Audrey [name] has never smoked. Audrey [name] has never used smokeless tobacco. Audrey [name] reports that Audrey [name] does not drink alcohol and does not use drugs.    ROS Review of Systems  Constitutional: Negative.  Negative for appetite change.  HENT: Negative.    Respiratory:  Negative for shortness of breath.   Cardiovascular:  Negative for chest pain.  Gastrointestinal:  Negative for abdominal pain.  Musculoskeletal:  Negative for arthralgias.  Neurological:  Negative for tremors.  Psychiatric/Behavioral:  Negative for decreased concentration.      Objective:  BP 123/61   Pulse 69   Wt 114 lb (51.7 kg)   SpO2 99%   BMI 19.57 kg/m   BP Readings from Last 3 Encounters:  04/01/23 123/61  02/16/23 (!) 101/53  01/11/23 (!) 106/58    Wt Readings from Last 3 Encounters:  04/01/23 114 lb (51.7 kg)  02/16/23 109 lb 12.8 oz (49.8 kg)  01/11/23 108 lb 6.4 oz (49.2 kg)     Physical Exam Constitutional:      General: Audrey [name] is not in acute distress.    Appearance: Audrey [name] is well-developed.  Cardiovascular:     Rate and Rhythm: Normal rate and regular rhythm.  Pulmonary:     Breath sounds: Normal breath sounds.  Musculoskeletal:        General: Normal range of motion.  Skin:    General: Skin is warm and dry.  Neurological:     Mental Status: Audrey [name] is alert and oriented to person, place, and time.       Assessment & Plan:   Audrey [name] was seen today for diabetes.  Diagnoses and all orders for this visit:  Type 1 diabetes mellitus without complication (HCC) -     insulin aspart (NOVOLOG FLEXPEN) 100 UNIT/ML FlexPen; Inject 4 Units into Audrey skin 3 (three) times daily with meals. Max daily 20 units  Immunization due -     Zoster Recombinant (Shingrix )  Other orders -  insulin degludec (TRESIBA FLEXTOUCH) 100 UNIT/ML FlexTouch Pen; Inject 16 Units into Audrey skin daily.    I believe Audrey [name] needs to transition away from her sliding scale is much as possible and on to a regimen that is easier for her to understand emphasizing Audrey use of Audrey long-acting Audrey [name] primarily with smaller doses of Audrey short acting.   I have changed Audrey [name]. Audrey [name] NovoLOG FlexPen and Audrey [name]. I am also having her maintain her aspirin, Calcium Carb-Cholecalciferol, TURMERIC PO, multivitamin-lutein, ascorbic acid, onetouch ultrasoft, FreeStyle Libre 2 Sensor, ondansetron, alendronate, Magnesium, Vitamin D-3, loratadine, Melatonin, Insulin Pen Needle, lisinopril, meloxicam, pravastatin, and sertraline.  Allergies as of 04/01/2023        Reactions   Morphine And Codeine    Januvia [sitagliptin]         Medication List        Accurate as of April 01, 2023  7:46 PM. If you have any questions, ask your nurse or doctor.          alendronate 70 MG tablet Commonly known as: FOSAMAX Take 1 tablet (70 mg total) by mouth every 7 (seven) days. Take with a full glass of water on an empty stomach.   Allergy Relief 10 MG tablet Generic drug: loratadine Take 10 mg by mouth daily.   ascorbic acid 500 MG tablet Commonly known as: VITAMIN C Take 500 mg by mouth daily.   aspirin 81 MG tablet Take 81 mg by mouth every other day.   Calcium Carb-Cholecalciferol 600-800 MG-UNIT Tabs Take by mouth.   FreeStyle Libre 2 Sensor Misc Change every 14 days   Insulin Pen Needle 32G X 4 MM Misc Use 4x a day   lisinopril 5 MG tablet Commonly known as: ZESTRIL Take 1 tablet (5 mg total) by mouth every other day.   Magnesium 250 MG Tabs Take 250 mg by mouth daily.   Melatonin 10 MG Tabs Take 10 mg by mouth at bedtime.   meloxicam 15 MG tablet Commonly known as: MOBIC Take 1 tablet (15 mg total) by mouth daily.   multivitamin-lutein Caps capsule Take 1 capsule by mouth daily.   NovoLOG FlexPen 100 UNIT/ML FlexPen Generic drug: insulin aspart Inject 4 Units into Audrey skin 3 (three) times daily with meals. Max daily 20 units What changed:  how much to take how to take this when to take this additional instructions Changed by: Audrey [name]   ondansetron 4 MG disintegrating tablet Commonly known as: ZOFRAN-ODT Take 1 tablet (4 mg total) by mouth every 8 (eight) hours as needed for nausea or vomiting.   onetouch ultrasoft lancets 1 each by Other route 4 (four) times daily. E11.9   pravastatin 80 MG tablet Commonly known as: PRAVACHOL Take 1 tablet (80 mg total) by mouth daily.   sertraline 50 MG tablet Commonly known as: ZOLOFT Take 1 tablet (50 mg total) by mouth daily.   Audrey [name] FlexTouch 100 UNIT/ML  FlexTouch Pen Generic drug: insulin degludec Inject 16 Units into Audrey skin daily. What changed: how much to take Changed by: Audrey [name]   TURMERIC PO Take by mouth.   Vitamin D-3 125 MCG (5000 UT) Tabs Take 125 mcg by mouth daily.         Follow-up: Return in about 1 month (around 05/02/2023).  Mechele Claude, M.D.

## 2023-04-04 ENCOUNTER — Telehealth: Payer: Self-pay | Admitting: Family Medicine

## 2023-04-04 NOTE — Telephone Encounter (Signed)
Copied from CRM [id number]. Topic: Clinical - Medical Advice >> Apr 01, 2023  5:13 PM Elle L wrote: Reason for CRM: The patient needs clarification from her appointment today and wants to know if she needs to count her carbs. Her call back number is [phone].

## 2023-04-04 NOTE — Telephone Encounter (Signed)
No need to count carbs, They are accounted for in the change in dose of insulin

## 2023-04-27 DIAGNOSIS — G4729 Other circadian rhythm sleep disorder: Secondary | ICD-10-CM | POA: Diagnosis not present

## 2023-05-03 ENCOUNTER — Ambulatory Visit: Payer: Medicare HMO | Admitting: Family Medicine

## 2023-05-10 ENCOUNTER — Encounter: Payer: Self-pay | Admitting: Family Medicine

## 2023-06-08 ENCOUNTER — Telehealth: Payer: Self-pay

## 2023-06-08 ENCOUNTER — Encounter: Payer: Self-pay | Admitting: Family Medicine

## 2023-06-08 ENCOUNTER — Ambulatory Visit: Payer: Medicare HMO | Admitting: Family Medicine

## 2023-06-08 NOTE — Telephone Encounter (Signed)
 Inquiring if we are still PCP due to 2 no shows.

## 2023-06-10 ENCOUNTER — Other Ambulatory Visit: Payer: Self-pay | Admitting: Family Medicine

## 2023-06-10 MED ORDER — FREESTYLE LIBRE 2 SENSOR MISC: 3 refills | Status: DC

## 2023-06-10 NOTE — Telephone Encounter (Signed)
 Copied from CRM [id number]. Topic: Clinical - Medication Refill >> Jun 10, 2023 12:34 PM Abundio Miu S wrote: Most Recent Primary Care Visit:   Medication: Continuous Glucose Sensor (FREESTYLE LIBRE 2 SENSOR) MISC  Has the patient contacted their pharmacy? No (Agent: If no, request that the patient contact the pharmacy for the refill. If patient does not wish to contact the pharmacy document the reason why and proceed with request.) (Agent: If yes, when and what did the pharmacy advise?)  Is this the correct pharmacy for this prescription? Yes If no, delete pharmacy and type the correct one.  This is the patient's preferred pharmacy:  [facility] [address] [place] - [address] [place] [address] [place] [address] Phone: [phone] Fax: [phone]    Has the prescription been filled recently? No  Is the patient out of the medication? Yes  Has the patient been seen for an appointment in the last year OR does the patient have an upcoming appointment? Yes  Can we respond through MyChart? No  Agent: Please be advised that Rx refills may take up to 3 business days. We ask that you follow-up with your pharmacy.

## 2023-06-15 DIAGNOSIS — D23122 Other benign neoplasm of skin of left lower eyelid, including canthus: Secondary | ICD-10-CM | POA: Diagnosis not present

## 2023-06-15 DIAGNOSIS — H5203 Hypermetropia, bilateral: Secondary | ICD-10-CM | POA: Diagnosis not present

## 2023-06-15 DIAGNOSIS — E119 Type 2 diabetes mellitus without complications: Secondary | ICD-10-CM | POA: Diagnosis not present

## 2023-06-15 LAB — HM DIABETES EYE EXAM

## 2023-06-16 ENCOUNTER — Encounter: Payer: Self-pay | Admitting: Family Medicine

## 2023-06-16 ENCOUNTER — Ambulatory Visit (INDEPENDENT_AMBULATORY_CARE_PROVIDER_SITE_OTHER): Admitting: Family Medicine

## 2023-06-16 ENCOUNTER — Telehealth: Payer: Self-pay | Admitting: Family Medicine

## 2023-06-16 VITALS — BP 153/78 | HR 89 | Temp 97.3°F | Ht 64.0 in | Wt 116.0 lb

## 2023-06-16 DIAGNOSIS — E1065 Type 1 diabetes mellitus with hyperglycemia: Secondary | ICD-10-CM

## 2023-06-16 DIAGNOSIS — E109 Type 1 diabetes mellitus without complications: Secondary | ICD-10-CM | POA: Diagnosis not present

## 2023-06-16 DIAGNOSIS — E1069 Type 1 diabetes mellitus with other specified complication: Secondary | ICD-10-CM | POA: Diagnosis not present

## 2023-06-16 DIAGNOSIS — Z13220 Encounter for screening for lipoid disorders: Secondary | ICD-10-CM

## 2023-06-16 DIAGNOSIS — E870 Hyperosmolality and hypernatremia: Secondary | ICD-10-CM | POA: Diagnosis not present

## 2023-06-16 LAB — CBC WITH DIFFERENTIAL/PLATELET
Basophils Absolute: 0 10*3/uL (ref 0.0–0.2)
Basos: 1 %
EOS (ABSOLUTE): 0.2 10*3/uL (ref 0.0–0.4)
Eos: 3 %
Hematocrit: 36.1 % (ref 34.0–46.6)
Hemoglobin: 11.7 g/dL (ref 11.1–15.9)
Immature Grans (Abs): 0 10*3/uL (ref 0.0–0.1)
Immature Granulocytes: 0 %
Lymphocytes Absolute: 0.9 10*3/uL (ref 0.7–3.1)
Lymphs: 17 %
MCH: 30.3 pg (ref 26.6–33.0)
MCHC: 32.4 g/dL (ref 31.5–35.7)
MCV: 94 fL (ref 79–97)
Monocytes Absolute: 0.4 10*3/uL (ref 0.1–0.9)
Monocytes: 7 %
Neutrophils Absolute: 4.1 10*3/uL (ref 1.4–7.0)
Neutrophils: 72 %
Platelets: 273 10*3/uL (ref 150–450)
RBC: 3.86 x10E6/uL (ref 3.77–5.28)
RDW: 12.3 % (ref 11.7–15.4)
WBC: 5.6 10*3/uL (ref 3.4–10.8)

## 2023-06-16 LAB — LIPID PANEL
Chol/HDL Ratio: 2.5 ratio (ref 0.0–4.4)
Cholesterol, Total: 121 mg/dL (ref 100–199)
HDL: 48 mg/dL (ref >39)
LDL Chol Calc (NIH): 60 mg/dL (ref 0–99)
Triglycerides: 58 mg/dL (ref 0–149)
VLDL Cholesterol Cal: 13 mg/dL (ref 5–40)

## 2023-06-16 LAB — CMP14+EGFR
ALT: 17 IU/L (ref 0–32)
AST: 23 IU/L (ref 0–40)
Albumin: 4.1 g/dL (ref 3.8–4.8)
Alkaline Phosphatase: 67 IU/L (ref 44–121)
BUN/Creatinine Ratio: 28 (ref 12–28)
BUN: 26 mg/dL (ref 8–27)
Bilirubin Total: 0.3 mg/dL (ref 0.0–1.2)
CO2: 24 mmol/L (ref 20–29)
Calcium: 9.9 mg/dL (ref 8.7–10.3)
Chloride: 100 mmol/L (ref 96–106)
Creatinine, Ser: 0.94 mg/dL (ref 0.57–1.00)
Globulin, Total: 2.2 g/dL (ref 1.5–4.5)
Glucose: 413 mg/dL (ref 70–99)
Potassium: 5 mmol/L (ref 3.5–5.2)
Sodium: 138 mmol/L (ref 134–144)
Total Protein: 6.3 g/dL (ref 6.0–8.5)
eGFR: 62 mL/min/{1.73_m2} (ref >59)

## 2023-06-16 LAB — BAYER DCA HB A1C WAIVED: HB A1C (BAYER DCA - WAIVED): 8.5 % — ABNORMAL HIGH (ref 4.8–5.6)

## 2023-06-16 MED ORDER — NOVOLOG 70/30 FLEXPEN RELION (70-30) 100 UNIT/ML ~~LOC~~ SUPN
10.0000 [IU] | PEN_INJECTOR | Freq: Every day | SUBCUTANEOUS | 2 refills | Status: DC
Start: 2023-06-16 — End: 2023-10-13

## 2023-06-16 NOTE — Progress Notes (Signed)
 Subjective:  Patient ID: Audrey [name], female    DOB: Jul 06, 1945  Age: 78 y.o. MRN: [id number]  CC: Follow-up (No concerns)   HPI CORIANN BROUHARD presents forFollow-up of diabetes. Patient checks blood sugar at home.  As low as 50's fasting, up to 150. Post prandial 150-300 Patient denies symptoms such as polyuria, polydipsia, excessive hunger, nausea No significant hypoglycemic spells noted. Medications reviewed. Pt reports taking them regularly without complication/adverse reaction being reported today.    History Chareese has a past medical history of Diabetes mellitus (HCC), Hyperlipidemia, Osteopenia, Pericardial effusion, PONV (postoperative nausea and vomiting), and Seasonal allergies.   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   Her family history includes Cancer in her sister; Diabetes in her brother; Heart attack in her father; Heart disease in her brother.She reports that she has never smoked. She has never used smokeless tobacco. She reports that she does not drink alcohol and does not use drugs.  Current Outpatient Medications on File Prior to Visit  Medication Sig Dispense Refill   aspirin 81 MG tablet Take 81 mg by mouth every other day.      Calcium Carb-Cholecalciferol 600-800 MG-UNIT TABS Take by mouth.     Cholecalciferol (VITAMIN D-3) 125 MCG (5000 UT) TABS Take 125 mcg by mouth daily.     Continuous Glucose Sensor (FREESTYLE LIBRE 2 SENSOR) MISC Change every 14 days 6 each 3   insulin degludec (TRESIBA FLEXTOUCH) 100 UNIT/ML FlexTouch Pen Inject 16 Units into the skin daily. 15 mL 3   Insulin Pen Needle 32G X 4 MM MISC Use 4x a day 100 each 3   Lancets (ONETOUCH ULTRASOFT) lancets 1 each by Other route 4 (four) times daily. E11.9 400 each 0   lisinopril (ZESTRIL) 5 MG tablet Take 1 tablet (5 mg total) by mouth every other day. 45 tablet 3   loratadine (ALLERGY RELIEF) 10  MG tablet Take 10 mg by mouth daily.     Magnesium 250 MG TABS Take 250 mg by mouth daily.     Melatonin 10 MG TABS Take 10 mg by mouth at bedtime.     meloxicam (MOBIC) 15 MG tablet Take 1 tablet (15 mg total) by mouth daily. 90 tablet 1   multivitamin-lutein (OCUVITE-LUTEIN) CAPS capsule Take 1 capsule by mouth daily.     pravastatin (PRAVACHOL) 80 MG tablet Take 1 tablet (80 mg total) by mouth daily. 90 tablet 3   sertraline (ZOLOFT) 50 MG tablet Take 1 tablet (50 mg total) by mouth daily. 90 tablet 3   TURMERIC PO Take by mouth.     vitamin C (ASCORBIC ACID) 500 MG tablet Take 500 mg by mouth daily.     alendronate (FOSAMAX) 70 MG tablet Take 1 tablet (70 mg total) by mouth every 7 (seven) days. Take with a full glass of water on an empty stomach. (Patient not taking: Reported on 06/16/2023) 12 tablet 3   ondansetron (ZOFRAN-ODT) 4 MG disintegrating tablet Take 1 tablet (4 mg total) by mouth every 8 (eight) hours as needed for nausea or vomiting. (Patient not taking: Reported on 06/16/2023) 12 tablet 0   No current facility-administered medications on file prior to visit.    ROS Review of Systems  Constitutional: Negative.   HENT: Negative.    Eyes:  Negative for visual disturbance.  Respiratory:  Negative for shortness of breath.   Cardiovascular:  Negative for chest pain.  Gastrointestinal:  Negative for abdominal pain.  Musculoskeletal:  Negative for arthralgias.    Objective:  BP (!) 153/78   Pulse 89   Temp (!) 97.3 F (36.3 C)   Ht 5\' 4"  (1.626 m)   Wt 116 lb (52.6 kg)   SpO2 95%   BMI 19.91 kg/m   BP Readings from Last 3 Encounters:  06/16/23 (!) 153/78  04/01/23 123/61  02/16/23 (!) 101/53    Wt Readings from Last 3 Encounters:  06/16/23 116 lb (52.6 kg)  04/01/23 114 lb (51.7 kg)  02/16/23 109 lb 12.8 oz (49.8 kg)    Lab Results  Component Value Date   HGBA1C 7.7 (H) 01/11/2023   HGBA1C 6.6 (H) 10/26/2022   HGBA1C 8.8 (A) 07/20/2022    Physical  Exam   Diabetic foot exam was performed with the following findings:   No deformities, ulcerations, or other skin breakdown Normal sensation of 10g monofilament Intact posterior tibialis and dorsalis pedis pulses Feet are dry moisturize daily     Assessment & Plan:  Type 1 diabetes mellitus without complication (HCC) -     Bayer DCA Hb A1c Waived  DM (diabetes mellitus), type 1, uncontrolled, with hyperosmolarity (HCC) -     Bayer DCA Hb A1c Waived -     CBC with Differential/Platelet -     CMP14+EGFR -     Lipid panel  DM hyperosmolarity type I, uncontrolled (HCC) -     Bayer DCA Hb A1c Waived -     CBC with Differential/Platelet -     CMP14+EGFR -     Lipid panel  Lipid screening  Other orders -     NovoLOG 70/30 FlexPen ReliOn; Inject 10 Units into the skin daily with breakfast.  Dispense: 15 mL; Refill: 2      Follow-up: Return in about 3 months (around 09/15/2023).  Mechele Claude, M.D.

## 2023-06-17 ENCOUNTER — Other Ambulatory Visit: Payer: Self-pay | Admitting: Internal Medicine

## 2023-06-17 ENCOUNTER — Other Ambulatory Visit: Payer: Self-pay | Admitting: Family Medicine

## 2023-06-17 NOTE — Telephone Encounter (Signed)
 Copied from CRM [id number]. Topic: Clinical - Medication Question >> Jun 17, 2023  8:22 AM Elle L wrote: Reason for CRM: The patient states that Dr. Darlyn Read talked to her about changing her Novolog to 10 day 70 at her appointment yesterday. I advised her that her insulin aspart protamine - aspart (NOVOLOG 70/30 FLEXPEN) (70-30) 100 UNIT/ML FlexPen was sent in yesterday but the patient would like clarification on this. The patient's call back number is [phone].

## 2023-06-17 NOTE — Telephone Encounter (Signed)
 Mailbox full 4/11 LS

## 2023-06-17 NOTE — Telephone Encounter (Signed)
NA / VM full 

## 2023-06-17 NOTE — Telephone Encounter (Signed)
 Insulin degludec has refills.

## 2023-06-17 NOTE — Telephone Encounter (Signed)
 NA/VM full to talk to her about her Novolog

## 2023-06-17 NOTE — Telephone Encounter (Signed)
 Blood sugar 413, looks like A1c was up as well, patient needs to watch diet closely and stay hydrated with plenty of water discussed ways to improve this with PCP when he is back next week

## 2023-06-17 NOTE — Telephone Encounter (Signed)
 Copied from CRM [id number]. Topic: Clinical - Medication Refill >> Jun 17, 2023  8:19 AM Elle L wrote: Most Recent Primary Care Visit:   Medication: insulin degludec (TRESIBA FLEXTOUCH) 100 UNIT/ML FlexTouch Pen [  Has the patient contacted their pharmacy? Yes  Is this the correct pharmacy for this prescription? Yes  This is the patient's preferred pharmacy:  [facility] [address], [place] - [address] [place] [address] Phone: [phone] Fax: [phone]  Has the prescription been filled recently? No  Is the patient out of the medication? Yes, one more pen.   Has the patient been seen for an appointment in the last year OR does the patient have an upcoming appointment? Yes  Can we respond through MyChart? No  Agent: Please be advised that Rx refills may take up to 3 business days. We ask that you follow-up with your pharmacy.

## 2023-06-20 ENCOUNTER — Telehealth: Payer: Self-pay

## 2023-06-20 NOTE — Telephone Encounter (Signed)
 Patient aware and verbalizes understanding.

## 2023-06-20 NOTE — Telephone Encounter (Signed)
 Copied from CRM [id number]. Topic: Clinical - Medication Question >> Jun 20, 2023  8:21 AM Carlatta H wrote: Reason for CRM:  Patient would like to know if she should be taking both the insulin aspart protamine - aspart (NOVOLOG 70/30 FLEXPEN) (70-30) 100 UNIT/ML FlexPen [[id number]] and TRESIBA FLEXTOUCH 100 UNIT/ML FlexTouch Pen [[id number]] daily//

## 2023-06-20 NOTE — Telephone Encounter (Signed)
**Note De-identified  [place] Obfuscation** Please advise 

## 2023-06-20 NOTE — Telephone Encounter (Signed)
 Yes

## 2023-06-22 ENCOUNTER — Telehealth: Payer: Self-pay | Admitting: Family Medicine

## 2023-06-22 ENCOUNTER — Telehealth: Payer: Self-pay

## 2023-06-22 NOTE — Telephone Encounter (Signed)
 Duplicate

## 2023-06-22 NOTE — Telephone Encounter (Signed)
 Copied from CRM [id number]. Topic: Clinical - Medication Question >> Jun 17, 2023  8:22 AM Elle L wrote: Reason for CRM: The patient states that Dr. Darlyn Read talked to her about changing her Novolog to 10 day 70 at her appointment yesterday. I advised her that her insulin aspart protamine - aspart (NOVOLOG 70/30 FLEXPEN) (70-30) 100 UNIT/ML FlexPen was sent in yesterday but the patient would like clarification on this. The patient's call back number is [phone].

## 2023-06-22 NOTE — Telephone Encounter (Signed)
 Mailbox full, need clarification on what she is asking. LS

## 2023-06-22 NOTE — Telephone Encounter (Signed)
 Duplicate. LS

## 2023-06-22 NOTE — Telephone Encounter (Signed)
 Copied from CRM [id number]. Topic: Clinical - Medication Question >> Jun 17, 2023  8:22 AM Elle L wrote: Reason for CRM: The patient states that Dr. Veleta Gerold talked to her about changing her Novolog to 10 day 70 at her appointment yesterday. I advised her that her insulin aspart protamine - aspart (NOVOLOG 70/30 FLEXPEN) (70-30) 100 UNIT/ML FlexPen was sent in yesterday but the patient would like clarification on this. The patient's call back number is [phone]. >> Jun 22, 2023  2:21 PM Fredrica W wrote: Patient called back in stating that her daughter in law spoke with someone about the Flexpen 70-30 and said something about Relione. She is not sure what the issue is. Patient requesting call back. Thank You

## 2023-06-23 NOTE — Telephone Encounter (Signed)
 Per Dr. Veleta Gerold last OV note he has prescribed the Novolog Flexpen 70/30 - Brand is Relion. Pt is to inject 10 units daily.  Called home number with no answer or vmail. Called son's cell with no answer but left message to return call.

## 2023-06-23 NOTE — Telephone Encounter (Signed)
 Tried calling. No answer and vmail not set up

## 2023-06-24 ENCOUNTER — Other Ambulatory Visit: Payer: Self-pay | Admitting: Family Medicine

## 2023-06-27 ENCOUNTER — Other Ambulatory Visit: Payer: Self-pay | Admitting: Family Medicine

## 2023-06-27 DIAGNOSIS — E109 Type 1 diabetes mellitus without complications: Secondary | ICD-10-CM

## 2023-06-27 MED ORDER — LISINOPRIL 5 MG PO TABS: 5.0000 mg | ORAL_TABLET | ORAL | 3 refills | Status: AC

## 2023-06-27 NOTE — Telephone Encounter (Signed)
 Copied from CRM [id number]. Topic: Clinical - Medication Refill >> Jun 27, 2023  3:12 PM Leory Rands wrote: Most Recent Primary Care Visit:   Medication: sertraline  (ZOLOFT ) 50 MG tablet [[id number]]  Has the patient contacted their pharmacy? Yes (Agent: If no, request that the patient contact the pharmacy for the refill. If patient does not wish to contact the pharmacy document the reason why and proceed with request.) (Agent: If yes, when and what did the pharmacy advise?)  Is this the correct pharmacy for this prescription? Yes If no, delete pharmacy and type the correct one.  This is the patient's preferred pharmacy:  [facility] - [place], [place] - [address] [place] [address] Phone: [phone] Fax: [phone]      Has the prescription been filled recently? Yes  Is the patient out of the medication? Yes  Has the patient been seen for an appointment in the last year OR does the patient have an upcoming appointment? Yes  Can we respond through MyChart? Yes  Agent: Please be advised that Rx refills may take up to 3 business days. We ask that you follow-up with your pharmacy.

## 2023-06-27 NOTE — Telephone Encounter (Signed)
 Copied from CRM [id number]. Topic: Clinical - Medication Refill >> Jun 27, 2023  8:22 AM Dimple Francis wrote: Most Recent Primary Care Visit:   Medication: meloxicam  (MOBIC ) 15 MG tablet ; lisinopril  (ZESTRIL ) 5 MG tablet ; sertraline  (ZOLOFT ) 50 MG tablet  Has the patient contacted their pharmacy? Yes (Agent: If no, request that the patient contact the pharmacy for the refill. If patient does not wish to contact the pharmacy document the reason why and proceed with request.) (Agent: If yes, when and what did the pharmacy advise?)  Is this the correct pharmacy for this prescription? Yes If no, delete pharmacy and type the correct one.  This is the patient's preferred pharmacy:   [facility], [place] - [address] [place] [address] Phone: [phone] Fax: [phone]   Has the prescription been filled recently? Yes  Is the patient out of the medication? Yes  Has the patient been seen for an appointment in the last year OR does the patient have an upcoming appointment? Yes  Can we respond through MyChart? Yes  Agent: Please be advised that Rx refills may take up to 3 business days. We ask that you follow-up with your pharmacy.

## 2023-07-04 ENCOUNTER — Other Ambulatory Visit: Payer: Self-pay | Admitting: Family Medicine

## 2023-07-04 DIAGNOSIS — E109 Type 1 diabetes mellitus without complications: Secondary | ICD-10-CM

## 2023-07-04 NOTE — Telephone Encounter (Unsigned)
 Copied from CRM [id number]. Topic: Clinical - Medication Refill >> Jul 04, 2023  1:34 PM Baldomero Bone wrote: Most Recent Primary Care Visit:   Medication: lisinopril  (ZESTRIL ) 5 MG tablet  insulin  aspart protamine - aspart (NOVOLOG  70/30 FLEXPEN) (70-30) 100 UNIT/ML FlexPen TRESIBA  FLEXTOUCH 100 UNIT/ML FlexTouch Pen  Has the patient contacted their pharmacy? Yes (Agent: If no, request that the patient contact the pharmacy for the refill. If patient does not wish to contact the pharmacy document the reason why and proceed with request.) (Agent: If yes, when and what did the pharmacy advise?)Pharmacy called in refill  Is this the correct pharmacy for this prescription? Yes If no, delete pharmacy and type the correct one.  This is the patient's preferred pharmacy:  [facility], [place] - [address] [place] [address] Phone: [phone] Fax: [phone]  Has the prescription been filled recently? Yes  Is the patient out of the medication? Yes  Has the patient been seen for an appointment in the last year OR does the patient have an upcoming appointment? Yes  Can we respond through MyChart? Yes  Agent: Please be advised that Rx refills may take up to 3 business days. We ask that you follow-up with your pharmacy.

## 2023-07-06 ENCOUNTER — Telehealth: Payer: Self-pay

## 2023-07-06 ENCOUNTER — Other Ambulatory Visit: Payer: Self-pay | Admitting: Family Medicine

## 2023-07-06 NOTE — Telephone Encounter (Signed)
 Copied from CRM [id number]. Topic: General - Other >> Jul 06, 2023 10:46 AM Blair Bumpers wrote: Reason for CRM: Patient is stating that every time she comes for her office visits, she turns in a sheet to Dr. Veleta Gerold with her readings on it. Patient states she is needing a copy of that or either needs Dr. Veleta Gerold or nurse to call her and tell her where to start, otherwise, she will not know where to start & will have to cancel her upcoming appt. Please give patient a call back to advise. CB #: [phone]

## 2023-07-06 NOTE — Telephone Encounter (Signed)
 I'm not sure what she means by "where to start." I think the answer is to start checking from the day after her last appointment doing fasting and 2 hour after biggest meal readings every day.

## 2023-07-06 NOTE — Telephone Encounter (Signed)
 Copied from CRM [id number]. Topic: Clinical - Medication Refill >> Jul 06, 2023  5:07 PM Adrianna P wrote: Most Recent Primary Care Visit:   Medication: TRESIBA  FLEXTOUCH 100 UNIT/ML FlexTouch Pen  Has the patient contacted their pharmacy? No (Agent: If no, request that the patient contact the pharmacy for the refill. If patient does not wish to contact the pharmacy document the reason why and proceed with request.) (Agent: If yes, when and what did the pharmacy advise?)  Is this the correct pharmacy for this prescription? Yes If no, delete pharmacy and type the correct one.  This is the patient's preferred pharmacy:  [facility], [place] - [address] [place] [address] Phone: [phone] Fax: [phone]  [facility] [address], [place] - [place] [facility] [address] [place] [address] [place] [address] Phone: [phone] Fax: [phone]  [facility] - [place], [place] - [place] [address] [place] [address] [place] [address] Phone: [phone] Fax: [phone]  [facility] - [place], [place] - [address] [facility] [address] [facility] to [facility] [address] [place] [place] [address] Phone: [phone] Fax: [phone]   Has the prescription been filled recently? No  Is the patient out of the medication? Yes  Has the patient been seen for an appointment in the last year OR does the patient have an upcoming appointment? Yes  Can we respond through MyChart? No  Agent: Please be advised that Rx refills may take up to 3 business days. We ask that you follow-up with your pharmacy.

## 2023-07-06 NOTE — Telephone Encounter (Signed)
 Notified pt and she will start keep logs again.

## 2023-07-06 NOTE — Telephone Encounter (Signed)
 Copied from CRM [id number]. Topic: Clinical - Medication Refill >> Jul 06, 2023 10:43 AM Blair Bumpers wrote: Most Recent Primary Care Visit:   Medication: sertraline  (ZOLOFT ) 50 MG tablet, meloxicam  (MOBIC ) 15 MG tablet   Has the patient contacted their pharmacy? No (Agent: If no, request that the patient contact the pharmacy for the refill. If patient does not wish to contact the pharmacy document the reason why and proceed with request.) (Agent: If yes, when and what did the pharmacy advise?)  Is this the correct pharmacy for this prescription? Yes If no, delete pharmacy and type the correct one.  This is the patient's preferred pharmacy:  [facility], [place] - [address] [place] [address] Phone: [phone] Fax: [phone]   Has the prescription been filled recently? Yes  Is the patient out of the medication? Yes  Has the patient been seen for an appointment in the last year OR does the patient have an upcoming appointment? Yes  Can we respond through MyChart? Yes  Agent: Please be advised that Rx refills may take up to 3 business days. We ask that you follow-up with your pharmacy.

## 2023-07-06 NOTE — Telephone Encounter (Signed)
 Called and spoke with patient she states she is referring to Blood pressure and blood sugar reading logs she turned in. I can not see where they were scanned it to chart do you still have these if so patient wants a copy.

## 2023-07-07 NOTE — Telephone Encounter (Signed)
 Pt contacted and advised that many of her medications were filled recently, pt states that she has not picked them up yet, or is waiting for the medication to be mailed by [facility].

## 2023-07-07 NOTE — Telephone Encounter (Signed)
 Last Fill: Tresiba : 06/17/23  Last OV: 06/16/23 Next OV: 07/20/23  Routing to provider for review/authorization.

## 2023-07-11 DIAGNOSIS — D23122 Other benign neoplasm of skin of left lower eyelid, including canthus: Secondary | ICD-10-CM | POA: Diagnosis not present

## 2023-07-13 ENCOUNTER — Encounter (HOSPITAL_COMMUNITY): Payer: Self-pay

## 2023-07-20 ENCOUNTER — Encounter: Payer: Self-pay | Admitting: Family Medicine

## 2023-07-20 ENCOUNTER — Ambulatory Visit (INDEPENDENT_AMBULATORY_CARE_PROVIDER_SITE_OTHER): Admitting: Family Medicine

## 2023-07-20 VITALS — BP 132/65 | HR 77 | Temp 97.0°F | Ht 64.0 in | Wt 112.8 lb

## 2023-07-20 DIAGNOSIS — E109 Type 1 diabetes mellitus without complications: Secondary | ICD-10-CM

## 2023-07-20 MED ORDER — TRESIBA FLEXTOUCH 100 UNIT/ML ~~LOC~~ SOPN
12.0000 [IU] | PEN_INJECTOR | Freq: Every day | SUBCUTANEOUS | 0 refills | Status: DC
Start: 2023-07-20 — End: 2023-09-01

## 2023-07-20 NOTE — Patient Instructions (Signed)
 Take 10 units of Novolog  70/30 before breakfast  Take 12 units of Tresiba  insulin  just before bedtime

## 2023-07-20 NOTE — Progress Notes (Signed)
 Subjective:  Patient ID: Audrey [name], female    DOB: 08-25-1945  Age: 78 y.o. MRN: [id number]  CC: 1 month follow up    HPI [name] presents for recheck of her diabetes.  She has brought in her blood sugar readings for the last 2 weeks showing 4 times daily sugars being done and there have been a few lows into the 60s and a few highs around 200 but most are staying between the 125 and 180 postprandial levels.  Log was reviewed     07/20/2023   10:51 AM 06/16/2023    8:51 AM 04/01/2023   10:45 AM  Depression screen PHQ 2/9  Decreased Interest 0 0 0  Down, Depressed, Hopeless 0 0 0  PHQ - 2 Score 0 0 0  Altered sleeping 0 0 0  Tired, decreased energy 0 0 0  Change in appetite 0 0 0  Feeling bad or failure about yourself  0 0 0  Trouble concentrating 0 0 0  Moving slowly or fidgety/restless 0 0 0  Suicidal thoughts 0 0 0  PHQ-9 Score 0 0 0  Difficult doing work/chores Not difficult at all Not difficult at all Not difficult at all    History Briara has a past medical history of Diabetes mellitus (HCC), Hyperlipidemia, Osteopenia, Pericardial effusion, PONV (postoperative nausea and vomiting), and Seasonal allergies.   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   Her family history includes Cancer in her sister; Diabetes in her brother; Heart attack in her father; Heart disease in her brother.She reports that she has never smoked. She has never used smokeless tobacco. She reports that she does not drink alcohol and does not use drugs.    ROS Review of Systems  Constitutional: Negative.   HENT: Negative.    Eyes:  Negative for visual disturbance.  Respiratory:  Negative for shortness of breath.   Cardiovascular:  Negative for chest pain.  Gastrointestinal:  Negative for abdominal pain.  Musculoskeletal:  Negative for arthralgias.    Objective:  BP 132/65   Pulse 77   Temp  (!) 97 F (36.1 C)   Ht 5\' 4"  (1.626 m)   Wt 112 lb 12.8 oz (51.2 kg)   SpO2 97%   BMI 19.36 kg/m   BP Readings from Last 3 Encounters:  07/20/23 132/65  06/16/23 (!) 153/78  04/01/23 123/61    Wt Readings from Last 3 Encounters:  07/20/23 112 lb 12.8 oz (51.2 kg)  06/16/23 116 lb (52.6 kg)  04/01/23 114 lb (51.7 kg)     Physical Exam Constitutional:      General: She is not in acute distress.    Appearance: She is well-developed.  Cardiovascular:     Rate and Rhythm: Normal rate and regular rhythm.  Pulmonary:     Breath sounds: Normal breath sounds.  Musculoskeletal:        General: Normal range of motion.  Skin:    General: Skin is warm and dry.  Neurological:     Mental Status: She is alert and oriented to person, place, and time.      Assessment & Plan:  Type 1 diabetes mellitus without complications (HCC)  Other orders -     Tresiba  FlexTouch; Inject 12 Units into the skin at bedtime.  Dispense: 15 mL; Refill: 0     Follow-up: Return in about 6 weeks (around 08/31/2023).  Roise Cleaver, M.D.

## 2023-08-29 ENCOUNTER — Other Ambulatory Visit: Payer: Self-pay | Admitting: Family Medicine

## 2023-09-01 ENCOUNTER — Ambulatory Visit (INDEPENDENT_AMBULATORY_CARE_PROVIDER_SITE_OTHER): Admitting: Family Medicine

## 2023-09-01 ENCOUNTER — Encounter: Payer: Self-pay | Admitting: Family Medicine

## 2023-09-01 VITALS — BP 120/68 | HR 75 | Temp 97.7°F | Ht 64.0 in | Wt 109.0 lb

## 2023-09-01 DIAGNOSIS — E109 Type 1 diabetes mellitus without complications: Secondary | ICD-10-CM

## 2023-09-01 DIAGNOSIS — E782 Mixed hyperlipidemia: Secondary | ICD-10-CM | POA: Diagnosis not present

## 2023-09-01 DIAGNOSIS — E1069 Type 1 diabetes mellitus with other specified complication: Secondary | ICD-10-CM

## 2023-09-01 DIAGNOSIS — E1065 Type 1 diabetes mellitus with hyperglycemia: Secondary | ICD-10-CM

## 2023-09-01 DIAGNOSIS — E870 Hyperosmolality and hypernatremia: Secondary | ICD-10-CM | POA: Diagnosis not present

## 2023-09-01 MED ORDER — TRESIBA FLEXTOUCH 100 UNIT/ML ~~LOC~~ SOPN
16.0000 [IU] | PEN_INJECTOR | Freq: Every day | SUBCUTANEOUS | 1 refills | Status: DC
Start: 2023-09-01 — End: 2023-10-13

## 2023-09-01 MED ORDER — PRAVASTATIN SODIUM 80 MG PO TABS
80.0000 mg | ORAL_TABLET | Freq: Every day | ORAL | 3 refills | Status: DC
Start: 2023-09-01 — End: 2023-10-28

## 2023-09-01 NOTE — Progress Notes (Signed)
 Subjective:  Patient ID: Audrey [name], female    DOB: 1945/11/15  Age: 78 y.o. MRN: [id number]  CC: Medical Management of Chronic Issues   HPI Audrey [name] presents for follow up on her diabtes. Log sheet returned. Most of the readings are 180-300. No significant lows. Pt. Couldn't follow SSI so it was switched for her benefit to 10 units 70/30 with breakfast. She continues Tresiba  12 units daily. Although she is lucid, interactive, she seems to have developed a tendency to not comprehend detailed information.      07/20/2023   10:51 AM 06/16/2023    8:51 AM 04/01/2023   10:45 AM  Depression screen PHQ 2/9  Decreased Interest 0 0 0  Down, Depressed, Hopeless 0 0 0  PHQ - 2 Score 0 0 0  Altered sleeping 0 0 0  Tired, decreased energy 0 0 0  Change in appetite 0 0 0  Feeling bad or failure about yourself  0 0 0  Trouble concentrating 0 0 0  Moving slowly or fidgety/restless 0 0 0  Suicidal thoughts 0 0 0  PHQ-9 Score 0 0 0  Difficult doing work/chores Not difficult at all Not difficult at all Not difficult at all    History Audrey [name] has a past medical history of Diabetes mellitus (HCC), Hyperlipidemia, Osteopenia, Pericardial effusion, PONV (postoperative nausea and vomiting), and Seasonal allergies.   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   Her family history includes Cancer in her sister; Diabetes in her brother; Heart attack in her father; Heart disease in her brother.She reports that she has never smoked. She has never used smokeless tobacco. She reports that she does not drink alcohol and does not use drugs.    ROS Review of Systems  Constitutional: Negative.   HENT: Negative.    Eyes:  Negative for visual disturbance.  Respiratory:  Negative for shortness of breath.   Cardiovascular:  Negative for chest pain.  Gastrointestinal:  Negative for abdominal pain.   Musculoskeletal:  Negative for arthralgias.    Objective:  BP 120/68   Pulse 75   Temp 97.7 F (36.5 C)   Ht 5' 4 (1.626 m)   Wt 109 lb (49.4 kg)   SpO2 95%   BMI 18.71 kg/m   BP Readings from Last 3 Encounters:  09/01/23 120/68  07/20/23 132/65  06/16/23 (!) 153/78    Wt Readings from Last 3 Encounters:  09/01/23 109 lb (49.4 kg)  07/20/23 112 lb 12.8 oz (51.2 kg)  06/16/23 116 lb (52.6 kg)     Physical Exam Constitutional:      General: She is not in acute distress.    Appearance: She is well-developed.   Cardiovascular:     Rate and Rhythm: Normal rate and regular rhythm.  Pulmonary:     Breath sounds: Normal breath sounds.   Musculoskeletal:        General: Normal range of motion.   Skin:    General: Skin is warm and dry.   Neurological:     Mental Status: She is alert and oriented to person, place, and time.    Results for orders placed or performed in visit on 06/16/23  Bayer DCA Hb A1c Waived   Collection Time: 06/16/23  8:51 AM  Result Value Ref Range   HB A1C (BAYER DCA - WAIVED) 8.5 (H) 4.8 - 5.6 %  CBC with Differential/Platelet   Collection  Time: 06/16/23  8:53 AM  Result Value Ref Range   WBC 5.6 3.4 - 10.8 x10E3/uL   RBC 3.86 3.77 - 5.28 x10E6/uL   Hemoglobin 11.7 11.1 - 15.9 g/dL   Hematocrit 63.8 65.9 - 46.6 %   MCV 94 79 - 97 fL   MCH 30.3 26.6 - 33.0 pg   MCHC 32.4 31.5 - 35.7 g/dL   RDW 87.6 88.2 - 84.5 %   Platelets 273 150 - 450 x10E3/uL   Neutrophils 72 Not Estab. %   Lymphs 17 Not Estab. %   Monocytes 7 Not Estab. %   Eos 3 Not Estab. %   Basos 1 Not Estab. %   Neutrophils Absolute 4.1 1.4 - 7.0 x10E3/uL   Lymphocytes Absolute 0.9 0.7 - 3.1 x10E3/uL   Monocytes Absolute 0.4 0.1 - 0.9 x10E3/uL   EOS (ABSOLUTE) 0.2 0.0 - 0.4 x10E3/uL   Basophils Absolute 0.0 0.0 - 0.2 x10E3/uL   Immature Granulocytes 0 Not Estab. %   Immature Grans (Abs) 0.0 0.0 - 0.1 x10E3/uL  CMP14+EGFR   Collection Time: 06/16/23  8:53 AM  Result  Value Ref Range   Glucose 413 (HH) 70 - 99 mg/dL   BUN 26 8 - 27 mg/dL   Creatinine, Ser 9.05 0.57 - 1.00 mg/dL   eGFR 62 >40 fO/fpw/8.26   BUN/Creatinine Ratio 28 12 - 28   Sodium 138 134 - 144 mmol/L   Potassium 5.0 3.5 - 5.2 mmol/L   Chloride 100 96 - 106 mmol/L   CO2 24 20 - 29 mmol/L   Calcium 9.9 8.7 - 10.3 mg/dL   Total Protein 6.3 6.0 - 8.5 g/dL   Albumin 4.1 3.8 - 4.8 g/dL   Globulin, Total 2.2 1.5 - 4.5 g/dL   Bilirubin Total 0.3 0.0 - 1.2 mg/dL   Alkaline Phosphatase 67 44 - 121 IU/L   AST 23 0 - 40 IU/L   ALT 17 0 - 32 IU/L  Lipid panel   Collection Time: 06/16/23  8:53 AM  Result Value Ref Range   Cholesterol, Total 121 100 - 199 mg/dL   Triglycerides 58 0 - 149 mg/dL   HDL 48 >60 mg/dL   VLDL Cholesterol Cal 13 5 - 40 mg/dL   LDL Chol Calc (NIH) 60 0 - 99 mg/dL   Chol/HDL Ratio 2.5 0.0 - 4.4 ratio      Assessment & Plan:  DM (diabetes mellitus), type 1, uncontrolled, with hyperosmolarity (HCC) -     Tresiba  FlexTouch; Inject 16 Units into the skin at bedtime.  Dispense: 15 mL; Refill: 1  Mixed hyperlipidemia -     Pravastatin  Sodium; Take 1 tablet (80 mg total) by mouth daily.  Dispense: 90 tablet; Refill: 3     Follow-up: Return in about 6 weeks (around 10/13/2023) for diabetes with A1c . Lipids, etc.  Butler Der, M.D.

## 2023-09-04 ENCOUNTER — Encounter: Payer: Self-pay | Admitting: Family Medicine

## 2023-09-04 ENCOUNTER — Other Ambulatory Visit: Payer: Self-pay | Admitting: Family Medicine

## 2023-09-05 ENCOUNTER — Other Ambulatory Visit: Payer: Self-pay | Admitting: Internal Medicine

## 2023-09-05 DIAGNOSIS — E109 Type 1 diabetes mellitus without complications: Secondary | ICD-10-CM

## 2023-10-13 ENCOUNTER — Ambulatory Visit (INDEPENDENT_AMBULATORY_CARE_PROVIDER_SITE_OTHER): Admitting: Family Medicine

## 2023-10-13 ENCOUNTER — Encounter: Payer: Self-pay | Admitting: Family Medicine

## 2023-10-13 VITALS — BP 139/66 | HR 72 | Temp 97.8°F | Ht 64.0 in | Wt 108.2 lb

## 2023-10-13 DIAGNOSIS — E870 Hyperosmolality and hypernatremia: Secondary | ICD-10-CM | POA: Diagnosis not present

## 2023-10-13 DIAGNOSIS — E1069 Type 1 diabetes mellitus with other specified complication: Secondary | ICD-10-CM | POA: Diagnosis not present

## 2023-10-13 DIAGNOSIS — E1065 Type 1 diabetes mellitus with hyperglycemia: Secondary | ICD-10-CM

## 2023-10-13 LAB — CMP14+EGFR
ALT: 17 IU/L (ref 0–32)
AST: 22 IU/L (ref 0–40)
Albumin: 4.1 g/dL (ref 3.8–4.8)
Alkaline Phosphatase: 76 IU/L (ref 44–121)
BUN/Creatinine Ratio: 30 — ABNORMAL HIGH (ref 12–28)
BUN: 26 mg/dL (ref 8–27)
Bilirubin Total: 0.3 mg/dL (ref 0.0–1.2)
CO2: 26 mmol/L (ref 20–29)
Calcium: 10.1 mg/dL (ref 8.7–10.3)
Chloride: 96 mmol/L (ref 96–106)
Creatinine, Ser: 0.87 mg/dL (ref 0.57–1.00)
Globulin, Total: 2.1 g/dL (ref 1.5–4.5)
Glucose: 327 mg/dL — ABNORMAL HIGH (ref 70–99)
Potassium: 5.1 mmol/L (ref 3.5–5.2)
Sodium: 137 mmol/L (ref 134–144)
Total Protein: 6.2 g/dL (ref 6.0–8.5)
eGFR: 68 mL/min/1.73 (ref >59)

## 2023-10-13 LAB — BAYER DCA HB A1C WAIVED: HB A1C (BAYER DCA - WAIVED): 12.5 % — ABNORMAL HIGH (ref 4.8–5.6)

## 2023-10-13 MED ORDER — TRESIBA FLEXTOUCH 100 UNIT/ML ~~LOC~~ SOPN: 20.0000 [IU] | PEN_INJECTOR | Freq: Every day | SUBCUTANEOUS | 1 refills | Status: DC

## 2023-10-13 MED ORDER — NOVOLOG 70/30 FLEXPEN RELION (70-30) 100 UNIT/ML ~~LOC~~ SUPN: 10.0000 [IU] | PEN_INJECTOR | Freq: Every day | SUBCUTANEOUS | 2 refills | Status: DC

## 2023-10-13 NOTE — Progress Notes (Signed)
 Subjective:  Patient ID: Audrey [name], female    DOB: 1945/09/12  Age: 78 y.o. MRN: [id number]  CC: Medical Management of Chronic Issues   HPI Audrey [name] presents for recheck of her diabetes.  We have tried to uncomplicated her regimen by decreasing her 7030 to 10 units in the morning rather than a sliding scale.  Of note is that Audrey [name] continues to have elevated blood sugars later in the day.  See log attached.  Overall the glucose is above goal at almost all parameters.  It is unclear how well Audrey [name] understands and complies.  I am somewhat concerned for her memory.     10/13/2023    8:53 AM 07/20/2023   10:51 AM 06/16/2023    8:51 AM  Depression screen PHQ 2/9  Decreased Interest 0 0 0  Down, Depressed, Hopeless 0 0 0  PHQ - 2 Score 0 0 0  Altered sleeping  0 0  Tired, decreased energy  0 0  Change in appetite  0 0  Feeling bad or failure about yourself   0 0  Trouble concentrating  0 0  Moving slowly or fidgety/restless  0 0  Suicidal thoughts  0 0  PHQ-9 Score  0 0  Difficult doing work/chores  Not difficult at all Not difficult at all    History Audrey [name] has a past medical history of Diabetes mellitus (HCC), Hyperlipidemia, Osteopenia, Pericardial effusion, PONV (postoperative nausea and vomiting), and Seasonal allergies.   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   Her family history includes Cancer in her sister; Diabetes in her brother; Heart attack in her father; Heart disease in her brother.Audrey [name] reports that Audrey [name] has never smoked. Audrey [name] has never used smokeless tobacco. Audrey [name] reports that Audrey [name] does not drink alcohol and does not use drugs.    ROS Review of Systems  Constitutional: Negative.   HENT: Negative.    Eyes:  Negative for visual disturbance.  Respiratory:  Negative for shortness of breath.   Cardiovascular:  Negative for chest pain.  Gastrointestinal:  Negative for  abdominal pain.  Musculoskeletal:  Negative for arthralgias.    Objective:  BP 139/66   Pulse 72   Temp 97.8 F (36.6 C)   Ht 5' 4 (1.626 m)   Wt 108 lb 3.2 oz (49.1 kg)   SpO2 97%   BMI 18.57 kg/m   BP Readings from Last 3 Encounters:  10/13/23 139/66  09/01/23 120/68  07/20/23 132/65    Wt Readings from Last 3 Encounters:  10/13/23 108 lb 3.2 oz (49.1 kg)  09/01/23 109 lb (49.4 kg)  07/20/23 112 lb 12.8 oz (51.2 kg)     Physical Exam Constitutional:      General: Audrey [name] is not in acute distress.    Appearance: Audrey [name] is well-developed.  Cardiovascular:     Rate and Rhythm: Normal rate and regular rhythm.  Pulmonary:     Breath sounds: Normal breath sounds.  Musculoskeletal:        General: Normal range of motion.  Skin:    General: Skin is warm and dry.  Neurological:     Mental Status: Audrey [name] is alert and oriented to person, place, and time.    Results for orders placed or performed in visit on 10/13/23  Bayer DCA Hb A1c Waived   Collection Time: 10/13/23  8:47 AM  Result Value Ref Range   HB A1C (BAYER  [facility] - WAIVED) 12.5 (H) 4.8 - 5.6 %  CMP14+EGFR   Collection Time: 10/13/23  8:53 AM  Result Value Ref Range   Glucose 327 (H) 70 - 99 mg/dL   BUN 26 8 - 27 mg/dL   Creatinine, Ser 9.12 0.57 - 1.00 mg/dL   eGFR 68 >40 fO/fpw/8.26   BUN/Creatinine Ratio 30 (H) 12 - 28   Sodium 137 134 - 144 mmol/L   Potassium 5.1 3.5 - 5.2 mmol/L   Chloride 96 96 - 106 mmol/L   CO2 26 20 - 29 mmol/L   Calcium 10.1 8.7 - 10.3 mg/dL   Total Protein 6.2 6.0 - 8.5 g/dL   Albumin 4.1 3.8 - 4.8 g/dL   Globulin, Total 2.1 1.5 - 4.5 g/dL   Bilirubin Total 0.3 0.0 - 1.2 mg/dL   Alkaline Phosphatase 76 44 - 121 IU/L   AST 22 0 - 40 IU/L   ALT 17 0 - 32 IU/L      Assessment & Plan:  Uncontrolled type 1 diabetes mellitus with hyperglycemia (HCC) -     Bayer DCA Hb A1c Waived -     CMP14+EGFR -     Tresiba  FlexTouch; Inject 20 Units into the skin at bedtime.  Dispense: 18 mL;  Refill: 1  Other orders -     NovoLOG  70/30 FlexPen ReliOn; Inject 10 Units into the skin daily with breakfast.  Dispense: 15 mL; Refill: 2   A1c returned significantly higher.  As result both of her insulin  medications have been increased as noted above.  May need to look closer to her her ability to care for herself in the near future  Follow-up: Return in about 6 weeks (around 11/24/2023).  Butler Der, M.D.

## 2023-10-13 NOTE — Patient Instructions (Signed)
 Take Tresiba  20 units every morning. Take Novolog  70/30, 10 units every MORNING

## 2023-10-16 ENCOUNTER — Encounter: Payer: Self-pay | Admitting: Family Medicine

## 2023-10-17 ENCOUNTER — Encounter: Payer: Self-pay | Admitting: Family Medicine

## 2023-10-17 ENCOUNTER — Ambulatory Visit: Payer: Self-pay | Admitting: Family Medicine

## 2023-10-17 NOTE — Progress Notes (Signed)
 Hello Audrey [name],  Your lab result is normal and/or stable.Some minor variations that are not significant are commonly marked abnormal, but do not represent any medical problem for you.  Best regards, Butler Der, M.D.

## 2023-10-27 ENCOUNTER — Other Ambulatory Visit: Payer: Self-pay | Admitting: Family Medicine

## 2023-10-27 DIAGNOSIS — E782 Mixed hyperlipidemia: Secondary | ICD-10-CM

## 2023-11-03 ENCOUNTER — Other Ambulatory Visit: Payer: Self-pay | Admitting: Family Medicine

## 2023-11-16 ENCOUNTER — Other Ambulatory Visit: Payer: Self-pay

## 2023-11-16 ENCOUNTER — Encounter (HOSPITAL_COMMUNITY): Payer: Self-pay

## 2023-11-16 ENCOUNTER — Inpatient Hospital Stay (HOSPITAL_COMMUNITY)
Admission: EM | Admit: 2023-11-16 | Discharge: 2023-11-21 | DRG: 956 | Disposition: A | Discharge status: Skilled Nursing Facility | Attending: Family Medicine | Admitting: Family Medicine

## 2023-11-16 ENCOUNTER — Emergency Department (HOSPITAL_COMMUNITY)

## 2023-11-16 DIAGNOSIS — J302 Other seasonal allergic rhinitis: Secondary | ICD-10-CM | POA: Diagnosis not present

## 2023-11-16 DIAGNOSIS — E860 Dehydration: Secondary | ICD-10-CM | POA: Diagnosis not present

## 2023-11-16 DIAGNOSIS — M6282 Rhabdomyolysis: Secondary | ICD-10-CM | POA: Diagnosis present

## 2023-11-16 DIAGNOSIS — Z7983 Long term (current) use of bisphosphonates: Secondary | ICD-10-CM | POA: Diagnosis not present

## 2023-11-16 DIAGNOSIS — I441 Atrioventricular block, second degree: Secondary | ICD-10-CM | POA: Diagnosis present

## 2023-11-16 DIAGNOSIS — E440 Moderate protein-calorie malnutrition: Secondary | ICD-10-CM | POA: Diagnosis not present

## 2023-11-16 DIAGNOSIS — W08XXXA Fall from other furniture, initial encounter: Secondary | ICD-10-CM | POA: Diagnosis present

## 2023-11-16 DIAGNOSIS — T796XXA Traumatic ischemia of muscle, initial encounter: Secondary | ICD-10-CM | POA: Diagnosis present

## 2023-11-16 DIAGNOSIS — G4700 Insomnia, unspecified: Secondary | ICD-10-CM | POA: Diagnosis not present

## 2023-11-16 DIAGNOSIS — S72002A Fracture of unspecified part of neck of left femur, initial encounter for closed fracture: Principal | ICD-10-CM | POA: Diagnosis present

## 2023-11-16 DIAGNOSIS — R748 Abnormal levels of other serum enzymes: Secondary | ICD-10-CM | POA: Diagnosis not present

## 2023-11-16 DIAGNOSIS — S79911A Unspecified injury of right hip, initial encounter: Secondary | ICD-10-CM | POA: Diagnosis not present

## 2023-11-16 DIAGNOSIS — S72012A Unspecified intracapsular fracture of left femur, initial encounter for closed fracture: Principal | ICD-10-CM | POA: Diagnosis present

## 2023-11-16 DIAGNOSIS — E109 Type 1 diabetes mellitus without complications: Secondary | ICD-10-CM | POA: Diagnosis not present

## 2023-11-16 DIAGNOSIS — Z7982 Long term (current) use of aspirin: Secondary | ICD-10-CM

## 2023-11-16 DIAGNOSIS — Z751 Person awaiting admission to adequate facility elsewhere: Secondary | ICD-10-CM

## 2023-11-16 DIAGNOSIS — R2689 Other abnormalities of gait and mobility: Secondary | ICD-10-CM | POA: Diagnosis not present

## 2023-11-16 DIAGNOSIS — Z888 Allergy status to other drugs, medicaments and biological substances status: Secondary | ICD-10-CM

## 2023-11-16 DIAGNOSIS — Z471 Aftercare following joint replacement surgery: Secondary | ICD-10-CM | POA: Diagnosis not present

## 2023-11-16 DIAGNOSIS — M85862 Other specified disorders of bone density and structure, left lower leg: Secondary | ICD-10-CM | POA: Diagnosis not present

## 2023-11-16 DIAGNOSIS — Z791 Long term (current) use of non-steroidal anti-inflammatories (NSAID): Secondary | ICD-10-CM | POA: Diagnosis not present

## 2023-11-16 DIAGNOSIS — Z794 Long term (current) use of insulin: Secondary | ICD-10-CM | POA: Diagnosis not present

## 2023-11-16 DIAGNOSIS — Z681 Body mass index (BMI) 19 or less, adult: Secondary | ICD-10-CM | POA: Diagnosis not present

## 2023-11-16 DIAGNOSIS — Z79899 Other long term (current) drug therapy: Secondary | ICD-10-CM | POA: Diagnosis not present

## 2023-11-16 DIAGNOSIS — W19XXXA Unspecified fall, initial encounter: Secondary | ICD-10-CM | POA: Diagnosis not present

## 2023-11-16 DIAGNOSIS — Z885 Allergy status to narcotic agent status: Secondary | ICD-10-CM | POA: Diagnosis not present

## 2023-11-16 DIAGNOSIS — R739 Hyperglycemia, unspecified: Secondary | ICD-10-CM | POA: Diagnosis not present

## 2023-11-16 DIAGNOSIS — R410 Disorientation, unspecified: Secondary | ICD-10-CM | POA: Diagnosis not present

## 2023-11-16 DIAGNOSIS — M6289 Other specified disorders of muscle: Secondary | ICD-10-CM | POA: Diagnosis not present

## 2023-11-16 DIAGNOSIS — Y92009 Unspecified place in unspecified non-institutional (private) residence as the place of occurrence of the external cause: Secondary | ICD-10-CM | POA: Diagnosis not present

## 2023-11-16 DIAGNOSIS — E785 Hyperlipidemia, unspecified: Secondary | ICD-10-CM | POA: Diagnosis present

## 2023-11-16 DIAGNOSIS — M6281 Muscle weakness (generalized): Secondary | ICD-10-CM | POA: Diagnosis not present

## 2023-11-16 DIAGNOSIS — Z743 Need for continuous supervision: Secondary | ICD-10-CM | POA: Diagnosis not present

## 2023-11-16 DIAGNOSIS — I10 Essential (primary) hypertension: Secondary | ICD-10-CM | POA: Diagnosis not present

## 2023-11-16 DIAGNOSIS — S72002D Fracture of unspecified part of neck of left femur, subsequent encounter for closed fracture with routine healing: Secondary | ICD-10-CM | POA: Diagnosis not present

## 2023-11-16 DIAGNOSIS — M25552 Pain in left hip: Secondary | ICD-10-CM | POA: Diagnosis not present

## 2023-11-16 DIAGNOSIS — Z8249 Family history of ischemic heart disease and other diseases of the circulatory system: Secondary | ICD-10-CM | POA: Diagnosis not present

## 2023-11-16 DIAGNOSIS — Z043 Encounter for examination and observation following other accident: Secondary | ICD-10-CM | POA: Diagnosis not present

## 2023-11-16 DIAGNOSIS — Z833 Family history of diabetes mellitus: Secondary | ICD-10-CM

## 2023-11-16 DIAGNOSIS — R278 Other lack of coordination: Secondary | ICD-10-CM | POA: Diagnosis not present

## 2023-11-16 DIAGNOSIS — K5900 Constipation, unspecified: Secondary | ICD-10-CM | POA: Diagnosis not present

## 2023-11-16 DIAGNOSIS — I499 Cardiac arrhythmia, unspecified: Secondary | ICD-10-CM | POA: Diagnosis not present

## 2023-11-16 DIAGNOSIS — M810 Age-related osteoporosis without current pathological fracture: Secondary | ICD-10-CM | POA: Diagnosis not present

## 2023-11-16 DIAGNOSIS — E1065 Type 1 diabetes mellitus with hyperglycemia: Secondary | ICD-10-CM | POA: Diagnosis not present

## 2023-11-16 DIAGNOSIS — Z96642 Presence of left artificial hip joint: Secondary | ICD-10-CM | POA: Diagnosis not present

## 2023-11-16 DIAGNOSIS — I700 Atherosclerosis of aorta: Secondary | ICD-10-CM | POA: Diagnosis not present

## 2023-11-16 DIAGNOSIS — M8000XD Age-related osteoporosis with current pathological fracture, unspecified site, subsequent encounter for fracture with routine healing: Secondary | ICD-10-CM | POA: Diagnosis not present

## 2023-11-16 LAB — CBC WITH DIFFERENTIAL/PLATELET
Abs Immature Granulocytes: 0.03 K/uL (ref 0.00–0.07)
Basophils Absolute: 0 K/uL (ref 0.0–0.1)
Basophils Relative: 1 %
Eosinophils Absolute: 0.1 K/uL (ref 0.0–0.5)
Eosinophils Relative: 1 %
HCT: 39.7 % (ref 36.0–46.0)
Hemoglobin: 13.2 g/dL (ref 12.0–15.0)
Immature Granulocytes: 0 %
Lymphocytes Relative: 5 %
Lymphs Abs: 0.4 K/uL — ABNORMAL LOW (ref 0.7–4.0)
MCH: 30.7 pg (ref 26.0–34.0)
MCHC: 33.2 g/dL (ref 30.0–36.0)
MCV: 92.3 fL (ref 80.0–100.0)
Monocytes Absolute: 0.5 K/uL (ref 0.1–1.0)
Monocytes Relative: 5 %
Neutro Abs: 7.5 K/uL (ref 1.7–7.7)
Neutrophils Relative %: 88 %
Platelets: 206 K/uL (ref 150–400)
RBC: 4.3 MIL/uL (ref 3.87–5.11)
RDW: 13.3 % (ref 11.5–15.5)
WBC: 8.5 K/uL (ref 4.0–10.5)
nRBC: 0 % (ref 0.0–0.2)

## 2023-11-16 LAB — CK: Total CK: 1175 U/L — ABNORMAL HIGH (ref 38–234)

## 2023-11-16 LAB — BASIC METABOLIC PANEL WITH GFR
Anion gap: 14 (ref 5–15)
BUN: 13 mg/dL (ref 8–23)
CO2: 26 mmol/L (ref 22–32)
Calcium: 9.3 mg/dL (ref 8.9–10.3)
Chloride: 100 mmol/L (ref 98–111)
Creatinine, Ser: 0.73 mg/dL (ref 0.44–1.00)
GFR, Estimated: >60 mL/min (ref >60)
Glucose, Bld: 247 mg/dL — ABNORMAL HIGH (ref 70–99)
Potassium: 4 mmol/L (ref 3.5–5.1)
Sodium: 140 mmol/L (ref 135–145)

## 2023-11-16 LAB — GLUCOSE, CAPILLARY
Glucose-Capillary: 203 mg/dL — ABNORMAL HIGH (ref 70–99)
Glucose-Capillary: 226 mg/dL — ABNORMAL HIGH (ref 70–99)

## 2023-11-16 LAB — BLOOD GAS, VENOUS
Acid-Base Excess: 10.4 mmol/L — ABNORMAL HIGH (ref 0.0–2.0)
Bicarbonate: 35.7 mmol/L — ABNORMAL HIGH (ref 20.0–28.0)
Drawn by: 442
O2 Saturation: 18.4 %
Patient temperature: 37.1
pCO2, Ven: 49 mmHg (ref 44–60)
pH, Ven: 7.47 — ABNORMAL HIGH (ref 7.25–7.43)
pO2, Ven: <31 mmHg — CL (ref 32–45)

## 2023-11-16 LAB — TYPE AND SCREEN
ABO/RH(D): A NEG
Antibody Screen: NEGATIVE

## 2023-11-16 LAB — BETA-HYDROXYBUTYRIC ACID: Beta-Hydroxybutyric Acid: 0.37 mmol/L — ABNORMAL HIGH (ref 0.05–0.27)

## 2023-11-16 LAB — CBG MONITORING, ED: Glucose-Capillary: 229 mg/dL — ABNORMAL HIGH (ref 70–99)

## 2023-11-16 LAB — HEMOGLOBIN A1C
Hgb A1c MFr Bld: 10.2 % — ABNORMAL HIGH (ref 4.8–5.6)
Mean Plasma Glucose: 246.04 mg/dL

## 2023-11-16 LAB — VITAMIN D 25 HYDROXY (VIT D DEFICIENCY, FRACTURES): Vit D, 25-Hydroxy: 68.07 ng/mL (ref 30–100)

## 2023-11-16 MED ORDER — BISACODYL 5 MG PO TBEC
5.0000 mg | DELAYED_RELEASE_TABLET | Freq: Every day | ORAL | Status: DC | PRN
  Administered 2023-11-21: 5 mg via ORAL
  Filled 2023-11-16: qty 1

## 2023-11-16 MED ORDER — INSULIN ASPART 100 UNIT/ML IJ SOLN
5.0000 [IU] | Freq: Three times a day (TID) | INTRAMUSCULAR | Status: DC
  Administered 2023-11-16: 5 [IU] via SUBCUTANEOUS

## 2023-11-16 MED ORDER — LACTATED RINGERS IV BOLUS
1000.0000 mL | Freq: Once | INTRAVENOUS | Status: AC
  Administered 2023-11-16: 1000 mL via INTRAVENOUS

## 2023-11-16 MED ORDER — VITAMIN D 25 MCG (1000 UNIT) PO TABS
5000.0000 [IU] | ORAL_TABLET | Freq: Every day | ORAL | Status: DC
  Administered 2023-11-17 – 2023-11-21 (×5): 5000 [IU] via ORAL
  Filled 2023-11-16 (×5): qty 5

## 2023-11-16 MED ORDER — METHOCARBAMOL 500 MG PO TABS
500.0000 mg | ORAL_TABLET | Freq: Four times a day (QID) | ORAL | Status: DC | PRN
  Administered 2023-11-17: 500 mg via ORAL
  Filled 2023-11-16 (×2): qty 1

## 2023-11-16 MED ORDER — FENTANYL CITRATE PF 50 MCG/ML IJ SOSY
12.5000 ug | PREFILLED_SYRINGE | INTRAMUSCULAR | Status: DC | PRN
  Administered 2023-11-16 – 2023-11-17 (×3): 25 ug via INTRAVENOUS
  Filled 2023-11-16 (×3): qty 1

## 2023-11-16 MED ORDER — INSULIN ASPART 100 UNIT/ML IJ SOLN
0.0000 [IU] | Freq: Three times a day (TID) | INTRAMUSCULAR | Status: DC
  Administered 2023-11-16: 3 [IU] via SUBCUTANEOUS
  Administered 2023-11-17: 5 [IU] via SUBCUTANEOUS

## 2023-11-16 MED ORDER — FENTANYL CITRATE (PF) 100 MCG/2ML IJ SOLN
25.0000 ug | Freq: Once | INTRAMUSCULAR | Status: AC
  Administered 2023-11-16: 25 ug via INTRAVENOUS
  Filled 2023-11-16: qty 2

## 2023-11-16 MED ORDER — GLUCERNA SHAKE PO LIQD
237.0000 mL | Freq: Three times a day (TID) | ORAL | Status: DC
  Administered 2023-11-17 – 2023-11-21 (×9): 237 mL via ORAL

## 2023-11-16 MED ORDER — INSULIN GLARGINE 100 UNIT/ML ~~LOC~~ SOLN
15.0000 [IU] | Freq: Every day | SUBCUTANEOUS | Status: DC
  Filled 2023-11-16 (×3): qty 0.15

## 2023-11-16 MED ORDER — METHOCARBAMOL 1000 MG/10ML IJ SOLN: 500.0000 mg | Freq: Four times a day (QID) | INTRAMUSCULAR | Status: DC | PRN

## 2023-11-16 MED ORDER — PRAVASTATIN SODIUM 40 MG PO TABS
80.0000 mg | ORAL_TABLET | Freq: Every day | ORAL | Status: DC
Start: 2023-11-16 — End: 2023-11-16

## 2023-11-16 MED ORDER — INSULIN ASPART 100 UNIT/ML IJ SOLN
0.0000 [IU] | Freq: Every day | INTRAMUSCULAR | Status: DC
  Administered 2023-11-16: 2 [IU] via SUBCUTANEOUS

## 2023-11-16 MED ORDER — LACTATED RINGERS IV SOLN: INTRAVENOUS | Status: DC

## 2023-11-16 MED ORDER — MUPIROCIN 2 % EX OINT
1.0000 | TOPICAL_OINTMENT | Freq: Two times a day (BID) | CUTANEOUS | Status: DC
  Administered 2023-11-17 – 2023-11-21 (×9): 1 via NASAL
  Filled 2023-11-16: qty 22

## 2023-11-16 MED ORDER — FENTANYL CITRATE (PF) 100 MCG/2ML IJ SOLN: 12.5000 ug | INTRAMUSCULAR | Status: DC | PRN

## 2023-11-16 MED ORDER — HEPARIN SODIUM (PORCINE) 5000 UNIT/ML IJ SOLN
5000.0000 [IU] | Freq: Three times a day (TID) | INTRAMUSCULAR | Status: DC
Start: 2023-11-16 — End: 2023-11-18
  Administered 2023-11-16 – 2023-11-17 (×3): 5000 [IU] via SUBCUTANEOUS
  Filled 2023-11-16 (×4): qty 1

## 2023-11-16 MED ORDER — HYDROCODONE-ACETAMINOPHEN 5-325 MG PO TABS
1.0000 | ORAL_TABLET | Freq: Four times a day (QID) | ORAL | Status: DC | PRN
  Administered 2023-11-16 – 2023-11-17 (×3): 1 via ORAL
  Filled 2023-11-16 (×3): qty 1

## 2023-11-16 MED ORDER — PANTOPRAZOLE SODIUM 40 MG PO TBEC
40.0000 mg | DELAYED_RELEASE_TABLET | Freq: Every day | ORAL | Status: DC
  Administered 2023-11-17 – 2023-11-21 (×5): 40 mg via ORAL
  Filled 2023-11-16 (×5): qty 1

## 2023-11-16 MED ORDER — ONDANSETRON HCL 4 MG/2ML IJ SOLN: 4.0000 mg | Freq: Four times a day (QID) | INTRAMUSCULAR | Status: DC | PRN

## 2023-11-16 MED ORDER — HYDRALAZINE HCL 20 MG/ML IJ SOLN: 10.0000 mg | INTRAMUSCULAR | Status: DC | PRN

## 2023-11-16 MED ORDER — PROCHLORPERAZINE EDISYLATE 10 MG/2ML IJ SOLN: 10.0000 mg | INTRAMUSCULAR | Status: DC | PRN

## 2023-11-16 MED ORDER — SERTRALINE HCL 50 MG PO TABS
50.0000 mg | ORAL_TABLET | Freq: Every day | ORAL | Status: DC
  Administered 2023-11-17 – 2023-11-21 (×5): 50 mg via ORAL
  Filled 2023-11-16 (×5): qty 1

## 2023-11-16 MED ORDER — ZOLPIDEM TARTRATE 5 MG PO TABS: 5.0000 mg | ORAL_TABLET | Freq: Every evening | ORAL | Status: DC | PRN

## 2023-11-16 MED ORDER — ADULT MULTIVITAMIN W/MINERALS CH
1.0000 | ORAL_TABLET | Freq: Every day | ORAL | Status: DC
  Administered 2023-11-17 – 2023-11-21 (×5): 1 via ORAL
  Filled 2023-11-16 (×5): qty 1

## 2023-11-16 NOTE — Progress Notes (Signed)
 Initial Nutrition Assessment  DOCUMENTATION CODES:   Not applicable  INTERVENTION:   -ONce diet is advanced, add:   -Glucerna Shake po TID, each supplement provides 220 kcal and 10 grams of protein  -MVI with minerals daily  Pt with uncontrolled DM. RD provided:   -Carbohydrate Counting for People with Diabetes and Plate Method handouts from [facility] Nutrition Care Manual; attached to AVS/ discharge summary -Referred to [facility] for further support and reinforcement  NUTRITION DIAGNOSIS:   Increased nutrient needs related to post-op healing as evidenced by estimated needs.  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  Pt admitted with lt femoral neck fracture.   Reviewed I/O's: -400 ml x 24 hours  UOP: 400 ml x 24 hours  Pt unavailable at time of visit. Attempted to speak with pt via call to hospital room phone, however, unable to reach. RD unable to obtain further nutrition-related history or complete nutrition-focused physical exam at this time.    Orthopedics consult pending. Pt is currently NPO for anticipated surgery today.   Per TOC notes, pt lives alone. Plan SNF of discharge.    Reviewed wt hx; pt has experienced a 1.4% wt loss over the past 4 months, which is not significant for time frame.   Pt with increased nutritional needs for post-operative healing and would benefit from addition of oral nutrition supplements.    Medications reviewed and include vitamin D , protonix , and lactated ringers  infusion @ 100 ml/hr.   Lab Results  Component Value Date   HGBA1C 12.5 (H) 10/13/2023   PTA DM medications are 20 units tresiba  daily and 10 units insulin  aspart protamine aspart daily. Pt also uses Freestyle Libre CGM daily. Noted per H&P, CBG was 327 PTA.   Pt last seen by RD in 07/2022; pt with difficulty with transportation. Noted pt has forgotten to take blood sugars and insulin  at home in the past. Pt continues to have uncontrolled DM. Pt followed by outpatient endocrinology (Dr. Sam).   Labs reviewed: CBGS: 229 (inpatient orders for glycemic control are 0-5 units insulin  aspart daily at bedtime, 0-9 units insulin  aspart TID with meals, and 15 units insulin  glargine daily).    Diet Order:   Diet Order             Diet NPO time specified  Diet effective now                   EDUCATION NEEDS:   No education needs have been identified at this time  Skin:  Skin Assessment: Reviewed RN Assessment  Last BM:  Unknown  Height:   Ht Readings from Last 1 Encounters:  11/16/23 5' 4 (1.626 m)    Weight:   Wt Readings from Last 1 Encounters:  11/16/23 50.5 kg    Ideal Body Weight:  54.5 kg  BMI:  Body mass index is 19.11 kg/m.  Estimated Nutritional Needs:   Kcal:  1500-1700  Protein:  75-90 grams  Fluid:  1.5-1.7 L  Margery ORN, RD, LDN, CDCES Registered Dietitian III Certified Diabetes Care and Education Specialist If unable to reach this RD, please use RD Inpatient group chat on secure chat between hours of 8am-4 pm daily

## 2023-11-16 NOTE — ED Triage Notes (Addendum)
 Pt arrived via [facility] for a fall that happened sometime after dinner last night. Cbg in route was 327. EMS administered a 500 bolus. Patient does have noticeable left and right leg length difference.

## 2023-11-16 NOTE — ED Notes (Signed)
Pharmacy called for lantus.  

## 2023-11-16 NOTE — H&P (Signed)
 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  She has been unable to get up and walk due to the severe pain.  She ended up lying on the floor most of the night and soiled herself with stool.  Her son called to check-in on her and because she was unable to answer the phone he came over to check on her then called 911 when he found her on the floor.  Patient reportedly did not hit her head.  She mostly complained of left side and hip pain.  She was noted to have a blood glucose of 327.  In the ED she was evaluated normal chest x-ray, but her hip x-ray showed an acute left subcapital femoral neck fracture.  Orthopedics was consulted and Dr. Margrette planning to see patient and take to the OR.  Medicine is admitting for further management.    Past Medical History:  Diagnosis Date   Diabetes mellitus (HCC)    Hyperlipidemia    Osteopenia    2002 T-Score -2   Pericardial effusion    PONV (postoperative nausea and vomiting)    Seasonal allergies     Past Surgical History:  Procedure Laterality Date   rectal polyps removed     right wrist fx     SUBXYPHOID PERICARDIAL WINDOW  02/18/2012   Procedure: SUBXYPHOID PERICARDIAL WINDOW;  Surgeon: Maude Fleeta Ochoa, MD;  Location: [facility] OR;  Service: Thoracic;  Laterality: N/A;  TEE     reports that she has never smoked. She  has never used smokeless tobacco. She reports that she does not drink alcohol and does not use drugs.  Allergies  Allergen Reactions   Morphine And Codeine    Januvia [Sitagliptin]     Family History  Problem Relation Age of Onset   Heart attack Father    Diabetes Brother    Heart disease Brother    Cancer Sister        ovarian    Prior to Admission medications   Medication Sig Start Date End Date Taking? Authorizing Provider  alendronate  (FOSAMAX ) 70 MG tablet Take 1 tablet (70 mg total) by mouth every 7 (seven) days. Take with a full glass of water on an empty stomach. 10/11/22   Shamleffer, Ibtehal Jaralla, MD  aspirin 81 MG tablet Take 81 mg by mouth every other day.     [provider]  Calcium Carb-Cholecalciferol  600-800 MG-UNIT TABS Take by mouth.    [provider]  Cholecalciferol  (VITAMIN D -3) 125 MCG (5000 UT) TABS Take 125 mcg by mouth daily.    [provider]  Continuous Glucose Sensor (FREESTYLE LIBRE 3 PLUS SENSOR) MISC Change every 14 days 11/08/23   Zollie Lowers, MD  insulin  aspart protamine - aspart (NOVOLOG  70/30 FLEXPEN) (70-30) 100 UNIT/ML FlexPen Inject 10 Units into the skin daily with breakfast. 10/13/23   Zollie Lowers, MD  Insulin  Pen  Needle 32G X 4 MM MISC Use 4x a day 10/12/22   Zollie Lowers, MD  Lancets [facility] ULTRASOFT) lancets 1 each by Other route 4 (four) times daily. E11.9 07/24/19   Kassie Mallick, MD  lisinopril  (ZESTRIL ) 5 MG tablet Take 1 tablet (5 mg total) by mouth every other day. 06/27/23   Zollie Lowers, MD  loratadine (ALLERGY RELIEF) 10 MG tablet Take 10 mg by mouth daily.    [provider]  Magnesium 250 MG TABS Take 250 mg by mouth daily.    [provider]  Melatonin 10 MG TABS Take 10 mg by mouth at bedtime.    [provider]  multivitamin-lutein (OCUVITE-LUTEIN) CAPS capsule Take 1 capsule by mouth daily.    [provider]  pravastatin  (PRAVACHOL ) 80 MG tablet TAKE 1  TABLET BY MOUTH EVERY DAY 10/28/23   Zollie Lowers, MD  sertraline  (ZOLOFT ) 50 MG tablet TAKE 1 TABLET BY MOUTH EVERY DAY 08/30/23   Zollie Lowers, MD  TRESIBA  FLEXTOUCH 100 UNIT/ML FlexTouch Pen Inject 20 Units into the skin at bedtime. 10/13/23   Zollie Lowers, MD  TURMERIC PO Take by mouth.    [provider]  vitamin C (ASCORBIC ACID) 500 MG tablet Take 500 mg by mouth daily.    [provider]    Physical Exam: Vitals:   11/16/23 1033 11/16/23 1130  BP: (!) 144/63 (!) 149/70  Pulse: 80 77  Resp: 17 19  Temp: 98.7 F (37.1 C)   TempSrc: Oral   SpO2: 98% 93%  Weight: 49 kg   Height: 5' 4 (1.626 m)     Constitutional: NAD, calm, comfortable, dry mucus membranes.  Eyes: PERRL, lids and conjunctivae normal ENMT: Mucous membranes are dry. Posterior pharynx clear of any exudate or lesions.Normal dentition.  Neck: normal, supple, no masses, no thyromegaly Respiratory: clear to auscultation bilaterally, no wheezing, no crackles. Normal respiratory effort. No accessory muscle use.  Cardiovascular: normal s1, s2 sounds, no murmurs / rubs / gallops. No extremity edema. 2+ pedal pulses. No carotid bruits.  Abdomen: no tenderness, no masses palpated. No hepatosplenomegaly. Bowel sounds positive.  Musculoskeletal: left leg externally rotated and shortened compared to right, warm left foot with palpable pedal pulses.  Normal muscle tone.  Skin: no rashes, lesions, ulcers. No induration Neurologic: CN 2-12 grossly intact. Sensation intact, DTR normal. Strength 5/5 in all 4.  Psychiatric: Normal judgment and insight. Alert and oriented x 3. Normal mood.   Labs on Admission: I have personally reviewed following labs and imaging studies  CBC: Recent Labs  Lab 11/16/23 1107  WBC 8.5  NEUTROABS 7.5  HGB 13.2  HCT 39.7  MCV 92.3  PLT 206   Basic Metabolic Panel: Recent Labs  Lab 11/16/23 1107  NA 140  K 4.0  CL 100  CO2 26  GLUCOSE 247*  BUN 13  CREATININE 0.73   CALCIUM 9.3   GFR: Estimated Creatinine Clearance: 44.8 mL/min (by C-G formula based on SCr of 0.73 mg/dL). Liver Function Tests: No results for input(s): AST, ALT, ALKPHOS, BILITOT, PROT, ALBUMIN in the last 168 hours. No results for input(s): LIPASE, AMYLASE in the last 168 hours. No results for input(s): AMMONIA in the last 168 hours. Coagulation Profile: No results for input(s): INR, PROTIME in the last 168 hours. Cardiac Enzymes: Recent Labs  Lab 11/16/23 1107  CKTOTAL 1,175*   BNP (last 3 results) No results for input(s): PROBNP in the last 8760 hours. HbA1C: No results for input(s): HGBA1C in  the last 72 hours. CBG: Recent Labs  Lab 11/16/23 1105  GLUCAP 229*   Lipid Profile: No results for input(s): CHOL, HDL, LDLCALC, TRIG, CHOLHDL, LDLDIRECT in the last 72 hours. Thyroid Function Tests: No results for input(s): TSH, T4TOTAL, FREET4, T3FREE, THYROIDAB in the last 72 hours. Anemia Panel: No results for input(s): VITAMINB12, FOLATE, FERRITIN, TIBC, IRON, RETICCTPCT in the last 72 hours. Urine analysis:    Component Value Date/Time   COLORURINE YELLOW 10/07/2022 2329   APPEARANCEUR Clear 10/12/2022 0926   LABSPEC 1.011 10/07/2022 2329   PHURINE 6.0 10/07/2022 2329   GLUCOSEU Negative 10/12/2022 0926   HGBUR NEGATIVE 10/07/2022 2329   BILIRUBINUR Negative 10/12/2022 0926   KETONESUR 5 (A) 10/07/2022 2329   PROTEINUR Negative 10/12/2022 0926   PROTEINUR NEGATIVE 10/07/2022 2329   UROBILINOGEN 1.0 02/18/2012 0802   NITRITE Negative 10/12/2022 0926   NITRITE NEGATIVE 10/07/2022 2329   LEUKOCYTESUR Negative 10/12/2022 0926   LEUKOCYTESUR NEGATIVE 10/07/2022 2329    Radiological Exams on Admission: DG Chest Portable 1 View Result Date: 11/16/2023 EXAM: 1 VIEW XRAY OF THE CHEST 11/16/2023 11:19:00 AM COMPARISON: 10/07/2022 CLINICAL HISTORY: FALL. Per triage: Pt arrived via [facility] for a fall that happened  sometime after dinner last night. Cbg in route was 327. EMS administered a 500 bolus. Patient does have noticeable left and right leg length difference. FINDINGS: LUNGS AND PLEURA: No focal pulmonary opacity. No pulmonary edema. No pleural effusion. No pneumothorax. HEART AND MEDIASTINUM: No acute abnormality of the cardiac and mediastinal silhouettes. Aortic atherosclerosis. BONES AND SOFT TISSUES: No acute osseous abnormality. Degenerative changes of spine. IMPRESSION: 1. No acute process. Electronically signed by: Waddell Calk MD 11/16/2023 11:38 AM EDT RP Workstation: [id number]   DG Hip Unilat W or Wo Pelvis 2-3 Views Left Result Date: 11/16/2023 EXAM: 2 or more VIEW(S) XRAY OF THE UNILATERAL HIP 11/16/2023 11:19:00 AM COMPARISON: None available. CLINICAL HISTORY: L hip pain sp fall. Per triage: Pt arrived via [facility] for a fall that happened sometime after dinner last night. Cbg in route was 327. EMS administered a 500 bolus. Patient does have noticeable left and right leg length difference. FINDINGS: BONES AND JOINTS: Acute subcapital left femoral neck fracture. Proximal migration of distal femur. SOFT TISSUES: The soft tissues are unremarkable. IMPRESSION: 1. Acute subcapital left femoral neck fracture with proximal migration of the distal femur. Electronically signed by: Waddell Calk MD 11/16/2023 11:37 AM EDT RP Workstation: [id number]   EKG: Independently reviewed. NSR   Assessment/Plan Principal Problem:   Closed left hip fracture (HCC) Active Problems:   Second degree AV block, Mobitz type I   Type 1 diabetes mellitus without complications (HCC)   Rhabdomyolysis   Elevated CK   Left hip pain   Hyperglycemia   Acute left subcapital femoral neck fracture -- admit to med surg -- NPO pending ortho evaluation  -- IV pain and nausea medication -- Bed rest orders -- check 25-OH vitamin D  -- fall precautions  -- orthopedic consultation  -- supportive measures  ordered  Rhabdomyolysis -- IV fluid hydration ordered -- follow CK to improvement   Type 2 DM  -- follow up A1c -- SSI coverage ordered -- frequent CBG monitoring -- serum ketones reassuring pt not in DKA -- add some basal insulin    Essential hypertension -- IV hydralazine  ordered while NPO -- resume home lisinopril  tomorrow   Hyperlipidemia -- hold home pravastatin  due to elevated CK level   DVT prophylaxis: TED hose  Code Status: Full   Family  Communication:   Disposition Plan: TBD   Consults called: orthopedics   Admission status: INP Time spent:  Level of care: Med-Surg Afton Louder MD [facility] How to contact the [facility] Attending or Consulting provider 7A - 7P or covering provider during after hours 7P -7A, for this patient?  Check the care team in [facility] and look for a) attending/consulting TR[facility] provider listed and b) the TRH team listed Log into [email or web address] and use [facility]'s universal password to access. If you do not have the password, please contact the hospital operator. Locate the TR[facility] provider you are looking for under [facility] and page to a number that you can be directly reached. If you still have difficulty reaching the provider, please page the [facility] (Director on Call) for the Hospitalists listed on amion for assistance.   If 7PM-7AM, please contact night-coverage [email or web address] Password [facility]  11/16/2023, 12:09 PM

## 2023-11-16 NOTE — ED Notes (Signed)
 Transition of Care [facility]) - Emergency Department Mini Assessment   Patient Details  Name: Audrey [name] MRN: [id number] Date of Birth: [birth year]  Transition of Care [facility]) CM/SW Contact:    Noreen KATHEE Cleotilde ISRAEL Phone Number: 11/16/2023, 1:22 PM   Clinical Narrative:  CSW spoke with patient at bedside. Patient shared that she lives alone, but after this fall her son does not trust for her to live alone. Patient shared that she was independent prior and able to drive. Patient states that she had a walker, wheelchair, and cane. CSW shared with patient that a consult was placed for SNF. Patient stated that she really did not want to go , but would if she had to. CSW did call son and was unable to get in contact , phone just rung. ICM will continue to follow.   ED Mini Assessment: What brought you to the Emergency Department? : Fall - broken hip  Barriers to Discharge: Continued Medical Work up, ED Bed availability  Barrier interventions: Placement if patient is agreeable  Means of departure: Ambulance  Interventions which prevented an admission or readmission: SNF Placement    Patient Contact and Communications Key Contact 1: Richardson Sermon with: Patient Contact Date: 11/16/23,   Contact time: 1322 Contact Phone Number: Bedside    Patient states their goals for this hospitalization and ongoing recovery are:: get better CMS Medicare.gov Compare Post Acute Care list provided to:: Patient Choice offered to / list presented to : Patient  Admission diagnosis:  Closed left hip fracture (HCC) [S72.002A] Patient Active Problem List   Diagnosis Date Noted   Closed left hip fracture (HCC) 11/16/2023   Rhabdomyolysis 11/16/2023   Elevated CK 11/16/2023   Left hip pain 11/16/2023   Hyperglycemia 11/16/2023   Type 1 diabetes mellitus without complications (HCC) 04/02/2020   Osteoporosis 03/27/2018   Ingrowing toenail 10/19/2017   Left elbow pain 11/15/2013   S/P pericardial  surgery 02/20/2012   Second degree AV block, Mobitz type I 02/18/2012   Pericardial effusion 02/16/2012   Seasonal allergies    Osteopenia    PCP:  Zollie Lowers, MD Pharmacy:   [facility] [address], [place] - [address] [place] [address] Phone: [phone] Fax: [phone]

## 2023-11-16 NOTE — ED Provider Notes (Signed)
 [place] EMERGENCY DEPARTMENT AT [facility] Provider Note   CSN: [id number] Arrival date & time: 11/16/23  1025     Patient presents with: Audrey [name] is a 78 y.o. female.  {Add pertinent medical, surgical, social history, OB history to HPI:9021} 77 year old female with a history of diabetes who presents emergency department after a fall.  Patient reports that she fell off the couch at some point in time last night onto the ground.  Injured her left hip.  Was unable to get up and walk due to the pain.  Says that she laid there all night and had a bowel movement on herself.  Says that her son called her but she was unable to get up and get the phone so he came over to check on her and then called 911 when he found her on the ground.  She denies any head strike or LOC.  Denies pain aside from her left hip.  Not on blood thinners.  Did have a blood sugar of 327 for EMS and was given 500 mL fluid bolus.       Prior to Admission medications   Medication Sig Start Date End Date Taking? Authorizing Provider  alendronate  (FOSAMAX ) 70 MG tablet Take 1 tablet (70 mg total) by mouth every 7 (seven) days. Take with a full glass of water on an empty stomach. 10/11/22   Shamleffer, Ibtehal Jaralla, MD  aspirin 81 MG tablet Take 81 mg by mouth every other day.     [provider]  Calcium Carb-Cholecalciferol  600-800 MG-UNIT TABS Take by mouth.    [provider]  Cholecalciferol  (VITAMIN D -3) 125 MCG (5000 UT) TABS Take 125 mcg by mouth daily.    [provider]  Continuous Glucose Sensor (FREESTYLE LIBRE 3 PLUS SENSOR) MISC Change every 14 days 11/08/23   Zollie Lowers, MD  insulin  aspart protamine - aspart (NOVOLOG  70/30 FLEXPEN) (70-30) 100 UNIT/ML FlexPen Inject 10 Units into the skin daily with breakfast. 10/13/23   Zollie Lowers, MD  Insulin  Pen Needle 32G X 4 MM MISC Use 4x a day 10/12/22   Zollie Lowers, MD  Lancets [facility] ULTRASOFT) lancets 1  each by Other route 4 (four) times daily. E11.9 07/24/19   Kassie Mallick, MD  lisinopril  (ZESTRIL ) 5 MG tablet Take 1 tablet (5 mg total) by mouth every other day. 06/27/23   Zollie Lowers, MD  loratadine (ALLERGY RELIEF) 10 MG tablet Take 10 mg by mouth daily.    [provider]  Magnesium 250 MG TABS Take 250 mg by mouth daily.    [provider]  Melatonin 10 MG TABS Take 10 mg by mouth at bedtime.    [provider]  multivitamin-lutein (OCUVITE-LUTEIN) CAPS capsule Take 1 capsule by mouth daily.    [provider]  pravastatin  (PRAVACHOL ) 80 MG tablet TAKE 1 TABLET BY MOUTH EVERY DAY 10/28/23   Zollie Lowers, MD  sertraline  (ZOLOFT ) 50 MG tablet TAKE 1 TABLET BY MOUTH EVERY DAY 08/30/23   Zollie Lowers, MD  TRESIBA  FLEXTOUCH 100 UNIT/ML FlexTouch Pen Inject 20 Units into the skin at bedtime. 10/13/23   Zollie Lowers, MD  TURMERIC PO Take by mouth.    [provider]  vitamin C (ASCORBIC ACID) 500 MG tablet Take 500 mg by mouth daily.    [provider]    Allergies: Morphine and codeine and Januvia [sitagliptin]    Review of Systems  Updated Vital Signs BP (!) 144/63 (  BP Location: Right Arm)   Pulse 80   Temp 98.7 F (37.1 C) (Oral)   Resp 17   Ht 5' 4 (1.626 m)   Wt 49 kg   SpO2 98%   BMI 18.54 kg/m   Physical Exam Constitutional:      General: She is not in acute distress.    Appearance: Normal appearance. She is not ill-appearing.  HENT:     Head: Normocephalic and atraumatic.     Right Ear: External ear normal.     Left Ear: External ear normal.     Mouth/Throat:     Mouth: Mucous membranes are moist.     Pharynx: Oropharynx is clear.  Eyes:     Extraocular Movements: Extraocular movements intact.     Conjunctiva/sclera: Conjunctivae normal.     Pupils: Pupils are equal, round, and reactive to light.  Neck:     Comments: No C-spine midline tenderness to palpation Cardiovascular:     Rate and Rhythm: Normal  rate and regular rhythm.     Pulses: Normal pulses.     Heart sounds: Normal heart sounds.  Pulmonary:     Effort: Pulmonary effort is normal. No respiratory distress.     Breath sounds: Normal breath sounds.  Abdominal:     General: Abdomen is flat. There is no distension.     Palpations: Abdomen is soft. There is no mass.     Tenderness: There is no abdominal tenderness. There is no guarding.  Musculoskeletal:        General: No deformity. Normal range of motion.     Cervical back: No rigidity or tenderness.     Comments: No tenderness to palpation of chest wall.  No bruising noted.  No tenderness to palpation of bilateral clavicles.  No tenderness to palpation, bruising, or deformities noted of bilateral shoulders, elbows, wrists, knees, or ankles.  Tenderness to palpation and bruising of the left hip.  Left leg appears shortened and externally rotated.  DP pulse 2+ in the left foot.  Intact sensation to light touch of all dermatomes of the left foot.  Left foot appears warm and well-perfused.  Neurological:     General: No focal deficit present.     Mental Status: She is alert and oriented to person, place, and time. Mental status is at baseline.     Cranial Nerves: No cranial nerve deficit.     Sensory: No sensory deficit.     Motor: No weakness.     (all labs ordered are listed, but only abnormal results are displayed) Labs Reviewed  CBC WITH DIFFERENTIAL/PLATELET  BASIC METABOLIC PANEL WITH GFR  CK  BLOOD GAS, VENOUS  BETA-HYDROXYBUTYRIC ACID  CBG MONITORING, ED    EKG: None  Radiology: No results found.  {Document cardiac monitor, telemetry assessment procedure when appropriate:32947} Procedures   Medications Ordered in the ED - No data to display    {Click here for ABCD2, HEART and other calculators REFRESH Note before signing:1}                              Medical Decision Making Amount and/or Complexity of Data Reviewed Labs: ordered. Radiology:  ordered.   ***  {Document critical care time when appropriate  Document review of labs and clinical decision tools ie CHADS2VASC2, etc  Document your independent review of radiology images and any outside records  Document your discussion with family members, caretakers and with consultants  Document social determinants of health affecting pt's care  Document your decision making why or why not admission, treatments were needed:32947:::1}   Final diagnoses:  None    ED Discharge Orders     None

## 2023-11-16 NOTE — Discharge Instructions (Addendum)
 ORTHOPEDIC INSTRUCTIONS PER DR HARRISON Weightbearing as tolerated Direct lateral hip precautions DVT prophylaxis for 30 days Postop appointment with Dr. Margrette scheduled for 28 days    Plate Method for Diabetes   Foods with carbohydrates make your blood glucose level go up. The plate method is a simple way to meal plan and control the amount of carbohydrate you eat.         Use the following guidance to build a healthy plate to control carbohydrates. Divide a 9-inch plate into 3 sections, and consider your beverage the 4th section of your meal: Food Group Examples of Foods/Beverages for This Section of your Meal  Section 1: Non-starchy vegetables Fill  of your plate to include non-starchy vegetables Asparagus, broccoli, brussels sprouts, cabbage, carrots, cauliflower, celery, cucumber, green beans, mushrooms, peppers, salad greens, tomatoes, or zucchini.  Section 2: Protein foods Fill  of your plate to include a lean protein Lean meat, poultry, fish, seafood, cheese, eggs, lean deli meat, tofu, beans, lentils, nuts or nut butters.  Section 3: Carbohydrate foods Fill  of your plate to include carbohydrate foods Whole grains, whole wheat bread, brown rice, whole grain pasta, polenta, corn tortillas, fruit, or starchy vegetables (potatoes, green peas, corn, beans, acorn squash, and butternut squash). One cup of milk also counts as a food that contains carbohydrate.  Section 4: Beverage Choose water or a low-calorie drink for your beverage. Unsweetened tea, coffee, or flavored/sparkling water without added sugar.  Image reprinted with permission from The American Diabetes Association.  Copyright 2022 by the American Diabetes Association.   Copyright 2022  Academy of Nutrition and Dietetics. All rights reserved    Carbohydrate Counting For People With Diabetes  Foods with carbohydrates make your blood glucose level go up. Learning how to count carbohydrates can help you control your  blood glucose levels. First, identify the foods you eat that contain carbohydrates. Then, using the Foods with Carbohydrates chart, determine about how much carbohydrates are in your meals and snacks. Make sure you are eating foods with fiber, protein, and healthy fat along with your carbohydrate foods. Foods with Carbohydrates The following table shows carbohydrate foods that have about 15 grams of carbohydrate each. Using measuring cups, spoons, or a food scale when you first begin learning about carbohydrate counting can help you learn about the portion sizes you typically eat. The following foods have 15 grams carbohydrate each:  Grains 1 slice bread (1 ounce)  1 small tortilla (6-inch size)   large bagel (1 ounce)  1/3 cup pasta or rice (cooked)   hamburger or hot dog bun ( ounce)   cup cooked cereal   to  cup ready-to-eat cereal  2 taco shells (5-inch size) Fruit 1 small fresh fruit ( to 1 cup)   medium banana  17 small grapes (3 ounces)  1 cup melon or berries   cup canned or frozen fruit  2 tablespoons dried fruit (blueberries, cherries, cranberries, raisins)   cup unsweetened fruit juice  Starchy Vegetables  cup cooked beans, peas, corn, potatoes/sweet potatoes   large baked potato (3 ounces)  1 cup acorn or butternut squash  Snack Foods 3 to 6 crackers  8 potato chips or 13 tortilla chips ( ounce to 1 ounce)  3 cups popped popcorn  Dairy 3/4 cup (6 ounces) nonfat plain yogurt, or yogurt with sugar-free sweetener  1 cup milk  1 cup plain rice, soy, coconut or flavored almond milk Sweets and Desserts  cup ice cream or frozen yogurt  1 tablespoon jam, jelly, pancake syrup, table sugar, or honey  2 tablespoons light pancake syrup  1 inch square of frosted cake or 2 inch square of unfrosted cake  2 small cookies (2/3 ounce each) or  large cookie  Sometimes you'll have to estimate carbohydrate amounts if you don't know the exact recipe. One cup of mixed foods  like soups can have 1 to 2 carbohydrate servings, while some casseroles might have 2 or more servings of carbohydrate. Foods that have less than 20 calories in each serving can be counted as "free" foods. Count 1 cup raw vegetables, or  cup cooked non-starchy vegetables as "free" foods. If you eat 3 or more servings at one meal, then count them as 1 carbohydrate serving.  Foods without Carbohydrates  Not all foods contain carbohydrates. Meat, some dairy, fats, non-starchy vegetables, and many beverages don't contain carbohydrate. So when you count carbohydrates, you can generally exclude chicken, pork, beef, fish, seafood, eggs, tofu, cheese, butter, sour cream, avocado, nuts, seeds, olives, mayonnaise, water, black coffee, unsweetened tea, and zero-calorie drinks. Vegetables with no or low carbohydrate include green beans, cauliflower, tomatoes, and onions. How much carbohydrate should I eat at each meal?  Carbohydrate counting can help you plan your meals and manage your weight. Following are some starting points for carbohydrate intake at each meal. Work with your registered dietitian nutritionist to find the best range that works for your blood glucose and weight.   To Lose Weight To Maintain Weight  Women 2 - 3 carb servings 3 - 4 carb servings  Men 3 - 4 carb servings 4 - 5 carb servings  Checking your blood glucose after meals will help you know if you need to adjust the timing, type, or number of carbohydrate servings in your meal plan. Achieve and keep a healthy body weight by balancing your food intake and physical activity.  Tips How should I plan my meals?  Plan for half the food on your plate to include non-starchy vegetables, like salad greens, broccoli, or carrots. Try to eat 3 to 5 servings of non-starchy vegetables every day. Have a protein food at each meal. Protein foods include chicken, fish, meat, eggs, or beans (note that beans contain carbohydrate). These two food groups  (non-starchy vegetables and proteins) are low in carbohydrate. If you fill up your plate with these foods, you will eat less carbohydrate but still fill up your stomach. Try to limit your carbohydrate portion to  of the plate.  What fats are healthiest to eat?  Diabetes increases risk for heart disease. To help protect your heart, eat more healthy fats, such as olive oil, nuts, and avocado. Eat less saturated fats like butter, cream, and high-fat meats, like bacon and sausage. Avoid trans fats, which are in all foods that list "partially hydrogenated oil" as an ingredient. What should I drink?  Choose drinks that are not sweetened with sugar. The healthiest choices are water, carbonated or seltzer waters, and tea and coffee without added sugars.  Sweet drinks will make your blood glucose go up very quickly. One serving of soda or energy drink is  cup. It is best to drink these beverages only if your blood glucose is low.  Artificially sweetened, or diet drinks, typically do not increase your blood glucose if they have zero calories in them. Read labels of beverages, as some diet drinks do have carbohydrate and will raise your blood glucose. Label Reading Tips Read Nutrition Facts labels to find out  how many grams of carbohydrate are in a food you want to eat. Don't forget: sometimes serving sizes on the label aren't the same as how much food you are going to eat, so you may need to calculate how much carbohydrate is in the food you are serving yourself.   Carbohydrate Counting for People with Diabetes Sample 1-Day Menu  Breakfast  cup yogurt, low fat, low sugar (1 carbohydrate serving)   cup cereal, ready-to-eat, unsweetened (1 carbohydrate serving)  1 cup strawberries (1 carbohydrate serving)   cup almonds ( carbohydrate serving)  Lunch 1, 5 ounce can chunk light tuna  2 ounces cheese, low fat cheddar  6 whole wheat crackers (1 carbohydrate serving)  1 small apple (1 carbohydrate servings)    cup carrots ( carbohydrate serving)   cup snap peas  1 cup 1% milk (1 carbohydrate serving)   Evening Meal Stir fry made with: 3 ounces chicken  1 cup brown rice (3 carbohydrate servings)   cup broccoli ( carbohydrate serving)   cup green beans   cup onions  1 tablespoon olive oil  2 tablespoons teriyaki sauce ( carbohydrate serving)  Evening Snack 1 extra small banana (1 carbohydrate serving)  1 tablespoon peanut butter   Carbohydrate Counting for People with Diabetes Vegan Sample 1-Day Menu  Breakfast 1 cup cooked oatmeal (2 carbohydrate servings)   cup blueberries (1 carbohydrate serving)  2 tablespoons flaxseeds  1 cup soymilk fortified with calcium and vitamin D   1 cup coffee  Lunch 2 slices whole wheat bread (2 carbohydrate servings)   cup baked tofu   cup lettuce  2 slices tomato  2 slices avocado   cup baby carrots ( carbohydrate serving)  1 orange (1 carbohydrate serving)  1 cup soymilk fortified with calcium and vitamin D    Evening Meal Burrito made with: 1 6-inch corn tortilla (1 carbohydrate serving)  1 cup refried vegetarian beans (2 carbohydrate servings)   cup chopped tomatoes   cup lettuce   cup salsa  1/3 cup brown rice (1 carbohydrate serving)  1 tablespoon olive oil for rice   cup zucchini   Evening Snack 6 small whole grain crackers (1 carbohydrate serving)  2 apricots ( carbohydrate serving)   cup unsalted peanuts ( carbohydrate serving)    Carbohydrate Counting for People with Diabetes Vegetarian (Lacto-Ovo) Sample 1-Day Menu  Breakfast 1 cup cooked oatmeal (2 carbohydrate servings)   cup blueberries (1 carbohydrate serving)  2 tablespoons flaxseeds  1 egg  1 cup 1% milk (1 carbohydrate serving)  1 cup coffee  Lunch 2 slices whole wheat bread (2 carbohydrate servings)  2 ounces low-fat cheese   cup lettuce  2 slices tomato  2 slices avocado   cup baby carrots ( carbohydrate serving)  1 orange (1 carbohydrate serving)   1 cup unsweetened tea  Evening Meal Burrito made with: 1 6-inch corn tortilla (1 carbohydrate serving)   cup refried vegetarian beans (1 carbohydrate serving)   cup tomatoes   cup lettuce   cup salsa  1/3 cup brown rice (1 carbohydrate serving)  1 tablespoon olive oil for rice   cup zucchini  1 cup 1% milk (1 carbohydrate serving)  Evening Snack 6 small whole grain crackers (1 carbohydrate serving)  2 apricots ( carbohydrate serving)   cup unsalted peanuts ( carbohydrate serving)    Copyright 2020  Academy of Nutrition and Dietetics. All rights reserved.  Using Nutrition Labels: Carbohydrate  Serving Size  Look at the serving size. All  the information on the label is based on this portion. Servings Per Container  The number of servings contained in the package. Guidelines for Carbohydrate  Look at the total grams of carbohydrate in the serving size.  1 carbohydrate choice = 15 grams of carbohydrate. Range of Carbohydrate Grams Per Choice  Carbohydrate Grams/Choice Carbohydrate Choices  6-10   11-20 1  21-25 1  26-35 2  36-40 2  41-50 3  51-55 3  56-65 4  66-70 4  71-80 5    Copyright 2020  Academy of Nutrition and Dietetics. All rights reserved.

## 2023-11-16 NOTE — Progress Notes (Signed)
 Pt arrived to room 307 via stretcher from ED. Pt moved from stretcher to bed by staff members due to non-weight bearing status of Fx left hip. Pt oriented to room and safety precautions. Pt states understanding but does have some memory issues and confused conversation. Bed alarm on for safety, call bell within reach (pt instructed on call bell use and stated understanding with good return demonstration). Ice pack applied to left hip and thigh high ted hose applied per order. Report given to Daphne Slicker, RN, primary nurse. Attempted to call pt's son to inform him of pt's admission to room 307 but unable to reach at numbers listed in chart and unable to leave voice message.

## 2023-11-16 NOTE — ED Notes (Signed)
 3rd floor is capped. AC is aware and states she will call bed placement to pull patient back.

## 2023-11-16 NOTE — ED Notes (Signed)
Transport called to transport patient. 

## 2023-11-16 NOTE — ED Notes (Signed)
 Attempted to call son to let him know that this patient is going to room 307.

## 2023-11-16 NOTE — Hospital Course (Signed)
 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  She has been unable to get up and walk due to the severe pain.  She ended up lying on the floor most of the night and soiled herself with stool.  Her son called to check-in on her and because she was unable to answer the phone he came over to check on her then called 911 when he found her on the floor.  Patient reportedly did not hit her head.  She mostly complained of left side and hip pain.  She was noted to have a blood glucose of 327.  In the ED she was evaluated normal chest x-ray, but her hip x-ray showed an acute left subcapital femoral neck fracture.  Orthopedics was consulted and Dr. Margrette planning to see patient and take to the OR.  Medicine is admitting for further management.

## 2023-11-17 ENCOUNTER — Encounter (HOSPITAL_COMMUNITY): Payer: Self-pay | Admitting: Family Medicine

## 2023-11-17 DIAGNOSIS — R748 Abnormal levels of other serum enzymes: Secondary | ICD-10-CM | POA: Diagnosis not present

## 2023-11-17 DIAGNOSIS — S72002A Fracture of unspecified part of neck of left femur, initial encounter for closed fracture: Secondary | ICD-10-CM | POA: Diagnosis not present

## 2023-11-17 DIAGNOSIS — M25552 Pain in left hip: Secondary | ICD-10-CM | POA: Diagnosis not present

## 2023-11-17 DIAGNOSIS — E109 Type 1 diabetes mellitus without complications: Secondary | ICD-10-CM | POA: Diagnosis not present

## 2023-11-17 LAB — GLUCOSE, CAPILLARY
Glucose-Capillary: 261 mg/dL — ABNORMAL HIGH (ref 70–99)
Glucose-Capillary: 268 mg/dL — ABNORMAL HIGH (ref 70–99)
Glucose-Capillary: 457 mg/dL — ABNORMAL HIGH (ref 70–99)
Glucose-Capillary: 185 mg/dL — ABNORMAL HIGH (ref 70–99)
Glucose-Capillary: 82 mg/dL (ref 70–99)

## 2023-11-17 LAB — BASIC METABOLIC PANEL WITH GFR
Anion gap: 11 (ref 5–15)
BUN: 14 mg/dL (ref 8–23)
CO2: 23 mmol/L (ref 22–32)
Calcium: 8.3 mg/dL — ABNORMAL LOW (ref 8.9–10.3)
Chloride: 102 mmol/L (ref 98–111)
Creatinine, Ser: 0.67 mg/dL (ref 0.44–1.00)
GFR, Estimated: >60 mL/min (ref >60)
Glucose, Bld: 265 mg/dL — ABNORMAL HIGH (ref 70–99)
Potassium: 3.7 mmol/L (ref 3.5–5.1)
Sodium: 136 mmol/L (ref 135–145)

## 2023-11-17 LAB — CBC
HCT: 31.8 % — ABNORMAL LOW (ref 36.0–46.0)
Hemoglobin: 10.3 g/dL — ABNORMAL LOW (ref 12.0–15.0)
MCH: 30.2 pg (ref 26.0–34.0)
MCHC: 32.4 g/dL (ref 30.0–36.0)
MCV: 93.3 fL (ref 80.0–100.0)
Platelets: 188 K/uL (ref 150–400)
RBC: 3.41 MIL/uL — ABNORMAL LOW (ref 3.87–5.11)
RDW: 13.5 % (ref 11.5–15.5)
WBC: 6.7 K/uL (ref 4.0–10.5)
nRBC: 0 % (ref 0.0–0.2)

## 2023-11-17 LAB — CK: Total CK: 528 U/L — ABNORMAL HIGH (ref 38–234)

## 2023-11-17 MED ORDER — LACTATED RINGERS IV SOLN: INTRAVENOUS | Status: DC

## 2023-11-17 MED ORDER — CHLORHEXIDINE GLUCONATE 4 % EX SOLN
60.0000 mL | Freq: Once | CUTANEOUS | Status: AC
  Administered 2023-11-17: 4 via TOPICAL

## 2023-11-17 MED ORDER — INSULIN ASPART 100 UNIT/ML IJ SOLN: 15.0000 [IU] | Freq: Once | INTRAMUSCULAR | Status: AC

## 2023-11-17 MED ORDER — TRANEXAMIC ACID-NACL 1000-0.7 MG/100ML-% IV SOLN
1000.0000 mg | INTRAVENOUS | Status: AC
  Administered 2023-11-18: 1000 mg via INTRAVENOUS

## 2023-11-17 MED ORDER — CEFAZOLIN SODIUM-DEXTROSE 2-4 GM/100ML-% IV SOLN
2.0000 g | INTRAVENOUS | Status: AC
  Administered 2023-11-18: 2 g via INTRAVENOUS
  Filled 2023-11-17: qty 100

## 2023-11-17 MED ORDER — INSULIN GLARGINE 100 UNIT/ML ~~LOC~~ SOLN: 18.0000 [IU] | Freq: Every day | SUBCUTANEOUS | Status: DC

## 2023-11-17 MED ORDER — POVIDONE-IODINE 10 % EX SWAB
2.0000 | Freq: Once | CUTANEOUS | Status: AC
  Administered 2023-11-18: 2 via TOPICAL

## 2023-11-17 MED ORDER — INSULIN ASPART 100 UNIT/ML IJ SOLN
6.0000 [IU] | Freq: Three times a day (TID) | INTRAMUSCULAR | Status: DC
  Administered 2023-11-17: 6 [IU] via SUBCUTANEOUS

## 2023-11-17 MED ORDER — INSULIN GLARGINE 100 UNIT/ML ~~LOC~~ SOLN
20.0000 [IU] | Freq: Every day | SUBCUTANEOUS | Status: DC
  Filled 2023-11-17 (×2): qty 0.2

## 2023-11-17 MED ORDER — INSULIN GLARGINE 100 UNIT/ML ~~LOC~~ SOLN
30.0000 [IU] | Freq: Every day | SUBCUTANEOUS | Status: DC
  Administered 2023-11-17 – 2023-11-19 (×3): 30 [IU] via SUBCUTANEOUS
  Filled 2023-11-17 (×5): qty 0.3

## 2023-11-17 MED ORDER — INSULIN ASPART 100 UNIT/ML IJ SOLN: 10.0000 [IU] | Freq: Three times a day (TID) | INTRAMUSCULAR | Status: DC

## 2023-11-17 MED ORDER — INSULIN ASPART 100 UNIT/ML IJ SOLN
0.0000 [IU] | Freq: Every day | INTRAMUSCULAR | Status: DC
  Administered 2023-11-18: 3 [IU] via SUBCUTANEOUS
  Administered 2023-11-19: 2 [IU] via SUBCUTANEOUS

## 2023-11-17 MED ORDER — INSULIN ASPART 100 UNIT/ML IJ SOLN
0.0000 [IU] | Freq: Three times a day (TID) | INTRAMUSCULAR | Status: DC
  Administered 2023-11-17: 15 [IU] via SUBCUTANEOUS
  Administered 2023-11-17: 3 [IU] via SUBCUTANEOUS
  Administered 2023-11-18: 8 [IU] via SUBCUTANEOUS
  Administered 2023-11-19: 3 [IU] via SUBCUTANEOUS
  Administered 2023-11-19: 2 [IU] via SUBCUTANEOUS
  Administered 2023-11-19: 5 [IU] via SUBCUTANEOUS
  Administered 2023-11-20 (×2): 8 [IU] via SUBCUTANEOUS
  Administered 2023-11-21: 2 [IU] via SUBCUTANEOUS
  Administered 2023-11-21: 11 [IU] via SUBCUTANEOUS
  Administered 2023-11-21: 5 [IU] via SUBCUTANEOUS

## 2023-11-17 MED ORDER — INSULIN ASPART 100 UNIT/ML IJ SOLN
10.0000 [IU] | Freq: Three times a day (TID) | INTRAMUSCULAR | Status: DC
  Administered 2023-11-17 – 2023-11-19 (×3): 10 [IU] via SUBCUTANEOUS

## 2023-11-17 NOTE — H&P (View-Only) (Signed)
 [facility] [place]  Consult  Consult requested by Dr. Afton Louder   Patient ID: Audrey [name], female   DOB: [birth year], 78 y.o.   MRN: [id number]  SUMMARY AND PLAN  Fracture left femoral neck   78 year old female with uncontrolled diabetes lives alone at home has no assistive devices has osteopenia on recent bone density testing but normal vitamin D  on lab evaluation this admission presents after falling at home and fracturing her left hip.  The patient will be considered for left total hip versus bipolar replacement.  Decision will be made intraoperatively  The procedure has been fully reviewed with the patient; The risks and benefits of surgery have been discussed and explained and understood. Alternative treatment has also been reviewed, questions were encouraged and answered. The postoperative plan is also been reviewed.   Chief Complaint  Patient presents with   Fall    HPI  78 year old female with diabetes fell fracture left hip she has a recent bone density indicating osteopenia.  Vitamin D  levels normal in the hospital  Fall The accident occurred 12 to 24 hours ago (November 16, 2023). She fell from an unknown height. There was no blood loss. The point of impact was the left hip. The pain is present in the left hip. Pain severity now: Severe when patient is moved mild at rest. The symptoms are aggravated by movement. Pertinent negatives include no abdominal pain, bowel incontinence, fever, headaches, hearing loss, hematuria, loss of consciousness, nausea, numbness, tingling, visual change or vomiting.    Allergies  Allergen Reactions   Morphine And Codeine    Januvia [Sitagliptin]     Current Outpatient Medications  Medication Instructions   ascorbic acid (VITAMIN C) 500 mg, Daily   aspirin 81 mg, Every other day   Calcium Carb-Cholecalciferol  600-800 MG-UNIT TABS Take by mouth.   Continuous Glucose Sensor (FREESTYLE LIBRE 3 PLUS SENSOR) MISC Change every  14 days   insulin  aspart protamine - aspart (NOVOLOG  70/30 FLEXPEN) (70-30) 100 UNIT/ML FlexPen 10 Units, Subcutaneous, Daily with breakfast   Insulin  Pen Needle 32G X 4 MM MISC Use 4x a day   Lancets (ONETOUCH ULTRASOFT) lancets 1 each, Other, 4 times daily, E11.9   lisinopril  (ZESTRIL ) 5 mg, Oral, Every other day   Magnesium 250 mg, Daily   Melatonin 10 mg, Nightly   meloxicam  (MOBIC ) 15 mg, Oral, Daily   pravastatin  (PRAVACHOL ) 80 mg, Oral, Daily   sertraline  (ZOLOFT ) 50 mg, Oral, Daily   Tresiba  FlexTouch 20 Units, Subcutaneous, Daily at bedtime   TURMERIC PO Take by mouth.   Vitamin D -3 125 mcg, Daily     Review of Systems Review of Systems  Constitutional:  Negative for fever.  Gastrointestinal:  Negative for abdominal pain, bowel incontinence, nausea and vomiting.  Genitourinary:  Negative for hematuria.  Neurological:  Negative for tingling, loss of consciousness, numbness and headaches.  All other systems were reviewed and were normal   Past Medical History:  Diagnosis Date   Diabetes mellitus (HCC)    Hyperlipidemia    Osteopenia    2002 T-Score -2   Pericardial effusion    PONV (postoperative nausea and vomiting)    Seasonal allergies     Past Surgical History:  Procedure Laterality Date   rectal polyps removed     right wrist fx     SUBXYPHOID PERICARDIAL WINDOW  02/18/2012   Procedure: SUBXYPHOID PERICARDIAL WINDOW;  Surgeon: Maude Fleeta Ochoa, MD;  Location: [facility] OR;  Service: Thoracic;  Laterality: N/A;  TEE    Family History  Problem Relation Age of Onset   Heart attack Father    Diabetes Brother    Heart disease Brother    Cancer Sister        ovarian     Social History   Tobacco Use   Smoking status: Never   Smokeless tobacco: Never  Vaping Use   Vaping status: Never Used  Substance Use Topics   Alcohol use: No   Drug use: Never    Allergies  Allergen Reactions   Morphine And Codeine    Januvia [Sitagliptin]     Current  Facility-Administered Medications  Medication Dose Route Frequency Provider Last Rate Last Admin   bisacodyl  (DULCOLAX) EC tablet 5 mg  5 mg Oral Daily PRN Johnson, Clanford L, MD       cholecalciferol  (VITAMIN D3) 25 MCG (1000 UNIT) tablet 5,000 Units  5,000 Units Oral Daily Johnson, Clanford L, MD       feeding supplement (GLUCERNA SHAKE) (GLUCERNA SHAKE) liquid 237 mL  237 mL Oral TID BM Johnson, Clanford L, MD       fentaNYL  (SUBLIMAZE ) injection 12.5-25 mcg  12.5-25 mcg Intravenous Q2H PRN Madueme, Elvira C, RPH   25 mcg at 11/16/23 1629   heparin  injection 5,000 Units  5,000 Units Subcutaneous Q8H Johnson, Clanford L, MD   5,000 Units at 11/17/23 0522   hydrALAZINE  (APRESOLINE ) injection 10 mg  10 mg Intravenous Q4H PRN Johnson, Clanford L, MD       HYDROcodone -acetaminophen  (NORCO/VICODIN) 5-325 MG per tablet 1-2 tablet  1-2 tablet Oral Q6H PRN Vicci, Clanford L, MD   1 tablet at 11/17/23 9478   insulin  aspart (novoLOG ) injection 0-5 Units  0-5 Units Subcutaneous QHS Vicci, Clanford L, MD   2 Units at 11/16/23 2101   insulin  aspart (novoLOG ) injection 0-9 Units  0-9 Units Subcutaneous TID WC Johnson, Clanford L, MD   3 Units at 11/16/23 1829   insulin  aspart (novoLOG ) injection 6 Units  6 Units Subcutaneous TID WC Johnson, Clanford L, MD       insulin  glargine (LANTUS ) injection 20 Units  20 Units Subcutaneous Daily Johnson, Clanford L, MD       lactated ringers  infusion   Intravenous Continuous Johnson, Clanford L, MD       methocarbamol  (ROBAXIN ) tablet 500 mg  500 mg Oral Q6H PRN Johnson, Clanford L, MD       Or   methocarbamol  (ROBAXIN ) injection 500 mg  500 mg Intravenous Q6H PRN Johnson, Clanford L, MD       multivitamin with minerals tablet 1 tablet  1 tablet Oral Daily Johnson, Clanford L, MD       mupirocin  ointment (BACTROBAN ) 2 % 1 Application  1 Application Nasal BID Margrette Taft BRAVO, MD       ondansetron  (ZOFRAN ) injection 4 mg  4 mg Intravenous Q6H PRN Johnson, Clanford  L, MD       pantoprazole  (PROTONIX ) EC tablet 40 mg  40 mg Oral Q0600 Johnson, Clanford L, MD   40 mg at 11/17/23 0549   prochlorperazine  (COMPAZINE ) injection 10 mg  10 mg Intravenous Q4H PRN Johnson, Clanford L, MD       sertraline  (ZOLOFT ) tablet 50 mg  50 mg Oral Daily Johnson, Clanford L, MD       zolpidem  (AMBIEN ) tablet 5 mg  5 mg Oral QHS PRN Vicci Afton CROME, MD           Physical Exam BP 132/61  Pulse 90   Temp 98.4 F (36.9 C) (Oral)   Resp 18   Ht 5' 4 (1.626 m)   Wt 50.5 kg   SpO2 94%   BMI 19.11 kg/m    General appearance normal, ectomorphic body frame no gross deformities no congenital abnormalities  Alert and oriented 3, patient does exhibit some mild confusion but refocuses and seems to be lucid  Mood and affect normal    Upper extremities: Skin normal no tenderness, range of motion normal without contracture, joints all reduced normally.  Muscle tone normal no tremor  Ambulatory status: Unable to ambulate  Examination of the left hip reveals proximal tenderness over the greater trochanter, skin shows a bruise Hip flexion is deferred Internal rotation is deferred External rotation is deferred Hip stability is normal  Log roll maneuver painful Push pull test is painful Hip strength test deferred Leg lengths cannot assess   Examination right hip no tenderness skin normal, tenderness none range of motion normal hip stable muscle tone normal  Neurovascular examination normal sensation in the 2 lower extremities with normal pulses and perfusion, no peripheral edema, normal color   The groin area has normal lymph nodes    MEDICAL DECISION SECTION  Diagnosis left femoral neck fracture.  Imaging.  This is my interpretation of the images.  The images show a very comminuted left femoral neck fracture.  The patient appears to have osteopenia on x-ray  Hemoglobin A1c elevated poor diabetic control  Note normal vitamin D  in the  hospital  Osteopenia on bone density     Latest Ref Rng & Units 11/17/2023    4:40 AM 11/16/2023   11:07 AM 06/16/2023    8:53 AM  CBC  WBC 4.0 - 10.5 K/uL 6.7  8.5  5.6   Hemoglobin 12.0 - 15.0 g/dL 89.6  86.7  88.2   Hematocrit 36.0 - 46.0 % 31.8  39.7  36.1   Platelets 150 - 400 K/uL 188  206  273        Latest Ref Rng & Units 11/17/2023    4:40 AM 11/16/2023   11:07 AM 10/13/2023    8:53 AM  BMP  Glucose 70 - 99 mg/dL 734  752  672   BUN 8 - 23 mg/dL 14  13  26    Creatinine 0.44 - 1.00 mg/dL 9.32  9.26  9.12   BUN/Creat Ratio 12 - 28   30   Sodium 135 - 145 mmol/L 136  140  137   Potassium 3.5 - 5.1 mmol/L 3.7  4.0  5.1   Chloride 98 - 111 mmol/L 102  100  96   CO2 22 - 32 mmol/L 23  26  26    Calcium 8.9 - 10.3 mg/dL 8.3  9.3  89.8      PLAN:   At this point I discussed with the patient possibility of total versus bipolar replacement.  I will make a decision IntraOp.  She is borderline candidate for total hip arthroplasty as she is not requiring any assistive devices at home and does not have any major medical problems  I am concerned about the bipolar leading to acetabular erosion because of her bone density issues.  However, if we decide to go with a total hip fixation in the acetabulum may be an issue even with screws.  Plan at this point left total hip arthroplasty versus bipolar arthroplasty

## 2023-11-17 NOTE — Progress Notes (Signed)
 Pt's son Carliss called to check on pt. He was advised of pending hip replacement surgery tomorrow (Friday 11/18/2023) with Dr. Margrette at 12 noon. He states that he will not be able to be here until 12:30 due to his job. He also asks that if MD or staff need to contact family regarding pt to please call his wife, Latonja Bobeck at [phone].  Son states that pt has had significant decline in physical and cognitive abilities over the past 6 months. He states that the confusion comes and goes, but is happening more often as time passes. He states he has been trying to get help for her at home without success. Advised pt would most likely require some form of physical therapy after discharge. Son states that he and his wife both work and that pt cannot come home with them. Advised would contact [facility] for assistance regarding discharge disposition/placement and information regarding services available to patient. He states understanding.

## 2023-11-17 NOTE — Progress Notes (Signed)
 PROGRESS NOTE   Audrey [name]  FMW:[id number] DOB: May 17, 1945 DOA: 11/16/2023 PCP: Zollie Lowers, MD   Chief Complaint  Patient presents with   Fall   Level of care: Med-Surg  Brief Admission History:  78 year old female with type 1 diabetes mellitus, osteopenia, hyperlipidemia, PONV, history of pericardial effusion status post subxiphoid pericardial window placement about 10 years ago, rectal polyps presented to the emergency department by EMS after reportedly falling off of her sofa last night onto the floor and injured the left hip.  She has been unable to get up and walk due to the severe pain.  She ended up lying on the floor most of the night and soiled herself with stool.  Her son called to check-in on her and because she was unable to answer the phone he came over to check on her then called 911 when he found her on the floor.  Patient reportedly did not hit her head.  She mostly complained of left side and hip pain.  She was noted to have a blood glucose of 327.  In the ED she was evaluated normal chest x-ray, but her hip x-ray showed an acute left subcapital femoral neck fracture.  Orthopedics was consulted and Dr. Margrette planning to see patient and take to the OR.  Medicine is admitting for further management.   Assessment and Plan:  Acute left subcapital femoral neck fracture -- admit to med surg -- ortho evaluation with plans for surgery  -- IV pain and nausea medication -- Bed rest orders -- 25-OH vitamin D  is 68 reassuring -- fall precautions  -- supportive measures ordered   Rhabdomyolysis - resolving -- IV fluid hydration ordered -- follow CK to improvement  -- CK down to 528    Type 1 DM - Uncontrolled with hyperglycemia -- A1c - 10.2%  -- SSI coverage ordered -- frequent CBG monitoring -- serum ketones reassuring pt not in DKA -- increased semglee  to 30 units, increased prandial novolog  to 10 units TID, increased SSI coverage   Essential hypertension -- IV  hydralazine  ordered while NPO -- resume home lisinopril  tomorrow    Hyperlipidemia -- hold home pravastatin  due to elevated CK level   DVT prophylaxis: sq heparin  Code Status: Full  Family Communication:  Disposition: TBD    Consultants:  Orthopedics  Procedures:  Pending ORIF left hip  Antimicrobials:    Subjective: Pt resting comfortably no pain as long as she is not moved or manipulated, she is agreeable to having surgery, she is aware she is in the hospital.   Objective: Vitals:   11/16/23 1451 11/16/23 1812 11/16/23 2022 11/17/23 0522  BP: (!) 135/57 (!) 129/50 132/61 132/61  Pulse: 83 88 (!) 104 90  Resp:   18 18  Temp: 98.5 F (36.9 C) 99.5 F (37.5 C) 98.6 F (37 C) 98.4 F (36.9 C)  TempSrc: Oral Oral Oral Oral  SpO2: 97% 95% 96% 94%  Weight: 50.5 kg     Height: 5' 4 (1.626 m)       Intake/Output Summary (Last 24 hours) at 11/17/2023 1252 Last data filed at 11/17/2023 1226 Gross per 24 hour  Intake 1240.11 ml  Output 1300 ml  Net -59.89 ml   Filed Weights   11/16/23 1033 11/16/23 1451  Weight: 49 kg 50.5 kg   Examination:  General exam: Appears calm and not in distress but not fully comfortable.  Respiratory system: Clear to auscultation. Respiratory effort normal. Cardiovascular system: normal S1 & S2 heard.  No JVD, murmurs, rubs, gallops or clicks. No pedal edema. Gastrointestinal system: Abdomen is nondistended, soft and nontender. No organomegaly or masses felt. Normal bowel sounds heard. Central nervous system: Alert and oriented. No focal neurological deficits. Extremities: shortened ext rotated left leg. I noted warm feet and palpable pulses left foot. Skin: No rashes, lesions or ulcers. Psychiatry: Judgement and insight appear normal. Mood & affect appropriate.   Data Reviewed: I have personally reviewed following labs and imaging studies  CBC: Recent Labs  Lab 11/16/23 1107 11/17/23 0440  WBC 8.5 6.7  NEUTROABS 7.5  --   HGB 13.2  10.3*  HCT 39.7 31.8*  MCV 92.3 93.3  PLT 206 188    Basic Metabolic Panel: Recent Labs  Lab 11/16/23 1107 11/17/23 0440  NA 140 136  K 4.0 3.7  CL 100 102  CO2 26 23  GLUCOSE 247* 265*  BUN 13 14  CREATININE 0.73 0.67  CALCIUM 9.3 8.3*    CBG: Recent Labs  Lab 11/16/23 1616 11/16/23 2018 11/17/23 0532 11/17/23 0720 11/17/23 1125  GLUCAP 203* 226* 261* 268* 457*    No results found for this or any previous visit (from the past 240 hours).   Radiology Studies: DG Chest Portable 1 View Result Date: 11/16/2023 EXAM: 1 VIEW XRAY OF THE CHEST 11/16/2023 11:19:00 AM COMPARISON: 10/07/2022 CLINICAL HISTORY: FALL. Per triage: Pt arrived via [facility] for a fall that happened sometime after dinner last night. Cbg in route was 327. EMS administered a 500 bolus. Patient does have noticeable left and right leg length difference. FINDINGS: LUNGS AND PLEURA: No focal pulmonary opacity. No pulmonary edema. No pleural effusion. No pneumothorax. HEART AND MEDIASTINUM: No acute abnormality of the cardiac and mediastinal silhouettes. Aortic atherosclerosis. BONES AND SOFT TISSUES: No acute osseous abnormality. Degenerative changes of spine. IMPRESSION: 1. No acute process. Electronically signed by: Waddell Calk MD 11/16/2023 11:38 AM EDT RP Workstation: [id number]   DG Hip Unilat W or Wo Pelvis 2-3 Views Left Result Date: 11/16/2023 EXAM: 2 or more VIEW(S) XRAY OF THE UNILATERAL HIP 11/16/2023 11:19:00 AM COMPARISON: None available. CLINICAL HISTORY: L hip pain sp fall. Per triage: Pt arrived via [facility] for a fall that happened sometime after dinner last night. Cbg in route was 327. EMS administered a 500 bolus. Patient does have noticeable left and right leg length difference. FINDINGS: BONES AND JOINTS: Acute subcapital left femoral neck fracture. Proximal migration of distal femur. SOFT TISSUES: The soft tissues are unremarkable. IMPRESSION: 1. Acute subcapital left femoral neck fracture with  proximal migration of the distal femur. Electronically signed by: Waddell Calk MD 11/16/2023 11:37 AM EDT RP Workstation: [id number]   Scheduled Meds:  cholecalciferol   5,000 Units Oral Daily   feeding supplement (GLUCERNA SHAKE)  237 mL Oral TID BM   heparin  injection (subcutaneous)  5,000 Units Subcutaneous Q8H   insulin  aspart  0-15 Units Subcutaneous TID WC   insulin  aspart  0-5 Units Subcutaneous QHS   insulin  aspart  10 Units Subcutaneous TID WC   insulin  aspart  15 Units Subcutaneous Once   insulin  glargine  30 Units Subcutaneous Daily   multivitamin with minerals  1 tablet Oral Daily   mupirocin  ointment  1 Application Nasal BID   pantoprazole   40 mg Oral Q0600   sertraline   50 mg Oral Daily   Continuous Infusions:  lactated ringers  50 mL/hr at 11/17/23 0920    LOS: 1 day   Time spent: 53 mins  Joycelynn Fritsche, MD How to contact  the [facility] Attending or Consulting provider 7A - 7P or covering provider during after hours 7P -7A, for this patient?  Check the care team in [facility] and look for a) attending/consulting [facility] provider listed and b) the [facility] team listed Log into [email or web address] to find provider on call.  Locate the [facility] provider you are looking for under [facility] and page to a number that you can be directly reached. If you still have difficulty reaching the provider, please page the [facility] (Director on Call) for the Hospitalists listed on amion for assistance.  11/17/2023, 12:52 PM

## 2023-11-17 NOTE — Consult Note (Addendum)
 Ortho care [place]  Consult  Consult requested by Dr. Afton Louder   Patient ID: Audrey [name], female   DOB: [birth year], 78 y.o.   MRN: [id number]  SUMMARY AND PLAN  Fracture left femoral neck   78 year old female with uncontrolled diabetes lives alone at home has no assistive devices has osteopenia on recent bone density testing but normal vitamin D  on lab evaluation this admission presents after falling at home and fracturing her left hip.  The patient will be considered for left total hip versus bipolar replacement.  Decision will be made intraoperatively  The procedure has been fully reviewed with the patient; The risks and benefits of surgery have been discussed and explained and understood. Alternative treatment has also been reviewed, questions were encouraged and answered. The postoperative plan is also been reviewed.   Chief Complaint  Patient presents with   Fall    HPI  78 year old female with diabetes fell fracture left hip she has a recent bone density indicating osteopenia.  Vitamin D  levels normal in the hospital  Fall The accident occurred 12 to 24 hours ago (November 16, 2023). She fell from an unknown height. There was no blood loss. The point of impact was the left hip. The pain is present in the left hip. Pain severity now: Severe when patient is moved mild at rest. The symptoms are aggravated by movement. Pertinent negatives include no abdominal pain, bowel incontinence, fever, headaches, hearing loss, hematuria, loss of consciousness, nausea, numbness, tingling, visual change or vomiting.    Allergies  Allergen Reactions   Morphine And Codeine    Januvia [Sitagliptin]     Current Outpatient Medications  Medication Instructions   ascorbic acid (VITAMIN C) 500 mg, Daily   aspirin 81 mg, Every other day   Calcium Carb-Cholecalciferol  600-800 MG-UNIT TABS Take by mouth.   Continuous Glucose Sensor (FREESTYLE LIBRE 3 PLUS SENSOR) MISC Change every  14 days   insulin  aspart protamine - aspart (NOVOLOG  70/30 FLEXPEN) (70-30) 100 UNIT/ML FlexPen 10 Units, Subcutaneous, Daily with breakfast   Insulin  Pen Needle 32G X 4 MM MISC Use 4x a day   Lancets (ONETOUCH ULTRASOFT) lancets 1 each, Other, 4 times daily, E11.9   lisinopril  (ZESTRIL ) 5 mg, Oral, Every other day   Magnesium 250 mg, Daily   Melatonin 10 mg, Nightly   meloxicam  (MOBIC ) 15 mg, Oral, Daily   pravastatin  (PRAVACHOL ) 80 mg, Oral, Daily   sertraline  (ZOLOFT ) 50 mg, Oral, Daily   Tresiba  FlexTouch 20 Units, Subcutaneous, Daily at bedtime   TURMERIC PO Take by mouth.   Vitamin D -3 125 mcg, Daily     Review of Systems Review of Systems  Constitutional:  Negative for fever.  Gastrointestinal:  Negative for abdominal pain, bowel incontinence, nausea and vomiting.  Genitourinary:  Negative for hematuria.  Neurological:  Negative for tingling, loss of consciousness, numbness and headaches.  All other systems were reviewed and were normal   Past Medical History:  Diagnosis Date   Diabetes mellitus (HCC)    Hyperlipidemia    Osteopenia    2002 T-Score -2   Pericardial effusion    PONV (postoperative nausea and vomiting)    Seasonal allergies     Past Surgical History:  Procedure Laterality Date   rectal polyps removed     right wrist fx     SUBXYPHOID PERICARDIAL WINDOW  02/18/2012   Procedure: SUBXYPHOID PERICARDIAL WINDOW;  Surgeon: Maude Fleeta Ochoa, MD;  Location: [facility] OR;  Service: Thoracic;  Laterality: N/A;  TEE    Family History  Problem Relation Age of Onset   Heart attack Father    Diabetes Brother    Heart disease Brother    Cancer Sister        ovarian     Social History   Tobacco Use   Smoking status: Never   Smokeless tobacco: Never  Vaping Use   Vaping status: Never Used  Substance Use Topics   Alcohol use: No   Drug use: Never    Allergies  Allergen Reactions   Morphine And Codeine    Januvia [Sitagliptin]     Current  Facility-Administered Medications  Medication Dose Route Frequency Provider Last Rate Last Admin   bisacodyl  (DULCOLAX) EC tablet 5 mg  5 mg Oral Daily PRN Johnson, Clanford L, MD       cholecalciferol  (VITAMIN D3) 25 MCG (1000 UNIT) tablet 5,000 Units  5,000 Units Oral Daily Johnson, Clanford L, MD       feeding supplement (GLUCERNA SHAKE) (GLUCERNA SHAKE) liquid 237 mL  237 mL Oral TID BM Johnson, Clanford L, MD       fentaNYL  (SUBLIMAZE ) injection 12.5-25 mcg  12.5-25 mcg Intravenous Q2H PRN Madueme, Elvira C, RPH   25 mcg at 11/16/23 1629   heparin  injection 5,000 Units  5,000 Units Subcutaneous Q8H Johnson, Clanford L, MD   5,000 Units at 11/17/23 0522   hydrALAZINE  (APRESOLINE ) injection 10 mg  10 mg Intravenous Q4H PRN Johnson, Clanford L, MD       HYDROcodone -acetaminophen  (NORCO/VICODIN) 5-325 MG per tablet 1-2 tablet  1-2 tablet Oral Q6H PRN Vicci, Clanford L, MD   1 tablet at 11/17/23 9478   insulin  aspart (novoLOG ) injection 0-5 Units  0-5 Units Subcutaneous QHS Vicci, Clanford L, MD   2 Units at 11/16/23 2101   insulin  aspart (novoLOG ) injection 0-9 Units  0-9 Units Subcutaneous TID WC Johnson, Clanford L, MD   3 Units at 11/16/23 1829   insulin  aspart (novoLOG ) injection 6 Units  6 Units Subcutaneous TID WC Johnson, Clanford L, MD       insulin  glargine (LANTUS ) injection 20 Units  20 Units Subcutaneous Daily Johnson, Clanford L, MD       lactated ringers  infusion   Intravenous Continuous Johnson, Clanford L, MD       methocarbamol  (ROBAXIN ) tablet 500 mg  500 mg Oral Q6H PRN Johnson, Clanford L, MD       Or   methocarbamol  (ROBAXIN ) injection 500 mg  500 mg Intravenous Q6H PRN Johnson, Clanford L, MD       multivitamin with minerals tablet 1 tablet  1 tablet Oral Daily Johnson, Clanford L, MD       mupirocin  ointment (BACTROBAN ) 2 % 1 Application  1 Application Nasal BID Margrette Taft BRAVO, MD       ondansetron  (ZOFRAN ) injection 4 mg  4 mg Intravenous Q6H PRN Johnson, Clanford  L, MD       pantoprazole  (PROTONIX ) EC tablet 40 mg  40 mg Oral Q0600 Johnson, Clanford L, MD   40 mg at 11/17/23 0549   prochlorperazine  (COMPAZINE ) injection 10 mg  10 mg Intravenous Q4H PRN Johnson, Clanford L, MD       sertraline  (ZOLOFT ) tablet 50 mg  50 mg Oral Daily Johnson, Clanford L, MD       zolpidem  (AMBIEN ) tablet 5 mg  5 mg Oral QHS PRN Vicci Afton CROME, MD           Physical Exam BP 132/61  Pulse 90   Temp 98.4 F (36.9 C) (Oral)   Resp 18   Ht 5' 4 (1.626 m)   Wt 50.5 kg   SpO2 94%   BMI 19.11 kg/m    General appearance normal, ectomorphic body frame no gross deformities no congenital abnormalities  Alert and oriented 3, patient does exhibit some mild confusion but refocuses and seems to be lucid  Mood and affect normal    Upper extremities: Skin normal no tenderness, range of motion normal without contracture, joints all reduced normally.  Muscle tone normal no tremor  Ambulatory status: Unable to ambulate  Examination of the left hip reveals proximal tenderness over the greater trochanter, skin shows a bruise Hip flexion is deferred Internal rotation is deferred External rotation is deferred Hip stability is normal  Log roll maneuver painful Push pull test is painful Hip strength test deferred Leg lengths cannot assess   Examination right hip no tenderness skin normal, tenderness none range of motion normal hip stable muscle tone normal  Neurovascular examination normal sensation in the 2 lower extremities with normal pulses and perfusion, no peripheral edema, normal color   The groin area has normal lymph nodes    MEDICAL DECISION SECTION  Diagnosis left femoral neck fracture.  Imaging.  This is my interpretation of the images.  The images show a very comminuted left femoral neck fracture.  The patient appears to have osteopenia on x-ray  Hemoglobin A1c elevated poor diabetic control  Note normal vitamin D  in the  hospital  Osteopenia on bone density     Latest Ref Rng & Units 11/17/2023    4:40 AM 11/16/2023   11:07 AM 06/16/2023    8:53 AM  CBC  WBC 4.0 - 10.5 K/uL 6.7  8.5  5.6   Hemoglobin 12.0 - 15.0 g/dL 89.6  86.7  88.2   Hematocrit 36.0 - 46.0 % 31.8  39.7  36.1   Platelets 150 - 400 K/uL 188  206  273        Latest Ref Rng & Units 11/17/2023    4:40 AM 11/16/2023   11:07 AM 10/13/2023    8:53 AM  BMP  Glucose 70 - 99 mg/dL 734  752  672   BUN 8 - 23 mg/dL 14  13  26    Creatinine 0.44 - 1.00 mg/dL 9.32  9.26  9.12   BUN/Creat Ratio 12 - 28   30   Sodium 135 - 145 mmol/L 136  140  137   Potassium 3.5 - 5.1 mmol/L 3.7  4.0  5.1   Chloride 98 - 111 mmol/L 102  100  96   CO2 22 - 32 mmol/L 23  26  26    Calcium 8.9 - 10.3 mg/dL 8.3  9.3  89.8      PLAN:   At this point I discussed with the patient possibility of total versus bipolar replacement.  I will make a decision IntraOp.  She is borderline candidate for total hip arthroplasty as she is not requiring any assistive devices at home and does not have any major medical problems  I am concerned about the bipolar leading to acetabular erosion because of her bone density issues.  However, if we decide to go with a total hip fixation in the acetabulum may be an issue even with screws.  Plan at this point left total hip arthroplasty versus bipolar arthroplasty

## 2023-11-17 NOTE — Plan of Care (Signed)

## 2023-11-18 ENCOUNTER — Inpatient Hospital Stay (HOSPITAL_COMMUNITY): Admitting: Certified Registered Nurse Anesthetist

## 2023-11-18 ENCOUNTER — Encounter (HOSPITAL_COMMUNITY)
Admission: EM | Disposition: A | Discharge status: Skilled Nursing Facility | Payer: Self-pay | Source: Home / Self Care | Attending: Family Medicine

## 2023-11-18 ENCOUNTER — Inpatient Hospital Stay (HOSPITAL_COMMUNITY)

## 2023-11-18 ENCOUNTER — Encounter (HOSPITAL_COMMUNITY): Payer: Self-pay | Admitting: Family Medicine

## 2023-11-18 DIAGNOSIS — R748 Abnormal levels of other serum enzymes: Secondary | ICD-10-CM | POA: Diagnosis not present

## 2023-11-18 DIAGNOSIS — E109 Type 1 diabetes mellitus without complications: Secondary | ICD-10-CM | POA: Diagnosis not present

## 2023-11-18 DIAGNOSIS — S72002A Fracture of unspecified part of neck of left femur, initial encounter for closed fracture: Secondary | ICD-10-CM

## 2023-11-18 DIAGNOSIS — M25552 Pain in left hip: Secondary | ICD-10-CM | POA: Diagnosis not present

## 2023-11-18 DIAGNOSIS — E440 Moderate protein-calorie malnutrition: Secondary | ICD-10-CM | POA: Insufficient documentation

## 2023-11-18 HISTORY — PX: TOTAL HIP ARTHROPLASTY: SHX124

## 2023-11-18 LAB — CBC
HCT: 32.1 % — ABNORMAL LOW (ref 36.0–46.0)
Hemoglobin: 10.7 g/dL — ABNORMAL LOW (ref 12.0–15.0)
MCH: 31 pg (ref 26.0–34.0)
MCHC: 33.3 g/dL (ref 30.0–36.0)
MCV: 93 fL (ref 80.0–100.0)
Platelets: 194 K/uL (ref 150–400)
RBC: 3.45 MIL/uL — ABNORMAL LOW (ref 3.87–5.11)
RDW: 13.3 % (ref 11.5–15.5)
WBC: 6.2 K/uL (ref 4.0–10.5)
nRBC: 0 % (ref 0.0–0.2)

## 2023-11-18 LAB — CK: Total CK: 270 U/L — ABNORMAL HIGH (ref 38–234)

## 2023-11-18 LAB — SURGICAL PCR SCREEN
MRSA, PCR: NEGATIVE
Staphylococcus aureus: POSITIVE — AB

## 2023-11-18 LAB — GLUCOSE, CAPILLARY
Glucose-Capillary: 293 mg/dL — ABNORMAL HIGH (ref 70–99)
Glucose-Capillary: 268 mg/dL — ABNORMAL HIGH (ref 70–99)
Glucose-Capillary: 113 mg/dL — ABNORMAL HIGH (ref 70–99)
Glucose-Capillary: 107 mg/dL — ABNORMAL HIGH (ref 70–99)
Glucose-Capillary: 119 mg/dL — ABNORMAL HIGH (ref 70–99)
Glucose-Capillary: 103 mg/dL — ABNORMAL HIGH (ref 70–99)
Glucose-Capillary: 251 mg/dL — ABNORMAL HIGH (ref 70–99)

## 2023-11-18 SURGERY — ARTHROPLASTY, HIP, TOTAL,POSTERIOR APPROACH
Anesthesia: Spinal | Site: Hip | Laterality: Left

## 2023-11-18 MED ORDER — PROPOFOL 500 MG/50ML IV EMUL
INTRAVENOUS | Status: DC | PRN
Start: 2023-11-18 — End: 2023-11-18
  Administered 2023-11-18: 50 ug/kg/min via INTRAVENOUS

## 2023-11-18 MED ORDER — PHENYLEPHRINE HCL-NACL 20-0.9 MG/250ML-% IV SOLN
INTRAVENOUS | Status: AC
  Filled 2023-11-18: qty 250

## 2023-11-18 MED ORDER — OXYCODONE HCL 5 MG PO TABS
5.0000 mg | ORAL_TABLET | ORAL | Status: DC
  Administered 2023-11-18 – 2023-11-21 (×16): 5 mg via ORAL
  Filled 2023-11-18 (×17): qty 1

## 2023-11-18 MED ORDER — FENTANYL CITRATE (PF) 100 MCG/2ML IJ SOLN
INTRAMUSCULAR | Status: DC | PRN
  Administered 2023-11-18 (×2): 50 ug via INTRAVENOUS

## 2023-11-18 MED ORDER — CHLORHEXIDINE GLUCONATE 0.12 % MT SOLN: 15.0000 mL | Freq: Once | OROMUCOSAL | Status: DC

## 2023-11-18 MED ORDER — PROPOFOL 500 MG/50ML IV EMUL
INTRAVENOUS | Status: AC
  Filled 2023-11-18: qty 100

## 2023-11-18 MED ORDER — 0.9 % SODIUM CHLORIDE (POUR BTL) OPTIME
TOPICAL | Status: DC | PRN
  Administered 2023-11-18: 1000 mL

## 2023-11-18 MED ORDER — ORAL CARE MOUTH RINSE
15.0000 mL | Freq: Once | OROMUCOSAL | Status: DC
  Administered 2023-11-18: 15 mL via OROMUCOSAL

## 2023-11-18 MED ORDER — PROPOFOL 500 MG/50ML IV EMUL
INTRAVENOUS | Status: AC
  Filled 2023-11-18: qty 50

## 2023-11-18 MED ORDER — BUPIVACAINE-EPINEPHRINE (PF) 0.5% -1:200000 IJ SOLN
INTRAMUSCULAR | Status: AC
  Filled 2023-11-18: qty 30

## 2023-11-18 MED ORDER — CHLORHEXIDINE GLUCONATE 0.12 % MT SOLN
OROMUCOSAL | Status: AC
  Administered 2023-11-18: 15 mL
  Filled 2023-11-18: qty 15

## 2023-11-18 MED ORDER — LACTATED RINGERS IV SOLN: INTRAVENOUS | Status: DC

## 2023-11-18 MED ORDER — CEFAZOLIN SODIUM-DEXTROSE 2-4 GM/100ML-% IV SOLN
INTRAVENOUS | Status: AC
  Filled 2023-11-18: qty 100

## 2023-11-18 MED ORDER — BUPIVACAINE IN DEXTROSE 0.75-8.25 % IT SOLN
INTRATHECAL | Status: DC | PRN
  Administered 2023-11-18: 1.6 mL via INTRATHECAL

## 2023-11-18 MED ORDER — CEFAZOLIN SODIUM-DEXTROSE 2-4 GM/100ML-% IV SOLN
2.0000 g | Freq: Four times a day (QID) | INTRAVENOUS | Status: AC
  Administered 2023-11-18 – 2023-11-19 (×2): 2 g via INTRAVENOUS
  Filled 2023-11-18 (×2): qty 100

## 2023-11-18 MED ORDER — KETOROLAC TROMETHAMINE 30 MG/ML IJ SOLN
15.0000 mg | Freq: Once | INTRAMUSCULAR | Status: AC
  Administered 2023-11-18: 15 mg via INTRAVENOUS
  Filled 2023-11-18: qty 1

## 2023-11-18 MED ORDER — SODIUM CHLORIDE 0.9 % IV SOLN
INTRAVENOUS | Status: AC
Start: 2023-11-18 — End: 2023-11-19

## 2023-11-18 MED ORDER — DOCUSATE SODIUM 100 MG PO CAPS
100.0000 mg | ORAL_CAPSULE | Freq: Two times a day (BID) | ORAL | Status: DC
  Administered 2023-11-18 – 2023-11-21 (×6): 100 mg via ORAL
  Filled 2023-11-18 (×6): qty 1

## 2023-11-18 MED ORDER — FENTANYL CITRATE (PF) 100 MCG/2ML IJ SOLN
INTRAMUSCULAR | Status: AC
  Filled 2023-11-18: qty 2

## 2023-11-18 MED ORDER — BUPIVACAINE-EPINEPHRINE (PF) 0.5% -1:200000 IJ SOLN
INTRAMUSCULAR | Status: DC | PRN
  Administered 2023-11-18: 30 mL

## 2023-11-18 MED ORDER — ACETAMINOPHEN 500 MG PO TABS
500.0000 mg | ORAL_TABLET | Freq: Once | ORAL | Status: DC
Start: 2023-11-18 — End: 2023-11-21
  Filled 2023-11-18: qty 1

## 2023-11-18 MED ORDER — BUPIVACAINE IN DEXTROSE 0.75-8.25 % IT SOLN
INTRATHECAL | Status: AC
Start: 2023-11-18 — End: 2023-11-18
  Filled 2023-11-18: qty 2

## 2023-11-18 MED ORDER — TRANEXAMIC ACID-NACL 1000-0.7 MG/100ML-% IV SOLN
1000.0000 mg | Freq: Once | INTRAVENOUS | Status: AC
  Administered 2023-11-18: 1000 mg via INTRAVENOUS
  Filled 2023-11-18: qty 100

## 2023-11-18 MED ORDER — ENOXAPARIN SODIUM 40 MG/0.4ML IJ SOSY
40.0000 mg | PREFILLED_SYRINGE | INTRAMUSCULAR | Status: DC
  Administered 2023-11-19 – 2023-11-21 (×3): 40 mg via SUBCUTANEOUS
  Filled 2023-11-18 (×3): qty 0.4

## 2023-11-18 MED ORDER — TRANEXAMIC ACID-NACL 1000-0.7 MG/100ML-% IV SOLN
INTRAVENOUS | Status: AC
  Filled 2023-11-18: qty 100

## 2023-11-18 MED ORDER — SODIUM CHLORIDE 0.9 % IR SOLN
Status: DC | PRN
  Administered 2023-11-18: 3000 mL

## 2023-11-18 MED ORDER — METOCLOPRAMIDE HCL 10 MG PO TABS: 5.0000 mg | ORAL_TABLET | Freq: Three times a day (TID) | ORAL | Status: DC | PRN

## 2023-11-18 MED ORDER — CHLORHEXIDINE GLUCONATE 4 % EX SOLN: 1.0000 | CUTANEOUS | 1 refills | Status: AC

## 2023-11-18 MED ORDER — METOCLOPRAMIDE HCL 5 MG/ML IJ SOLN: 5.0000 mg | Freq: Three times a day (TID) | INTRAMUSCULAR | Status: DC | PRN

## 2023-11-18 MED ORDER — PHENOL 1.4 % MT LIQD: 1.0000 | OROMUCOSAL | Status: DC | PRN

## 2023-11-18 MED ORDER — MENTHOL 3 MG MT LOZG: 1.0000 | LOZENGE | OROMUCOSAL | Status: DC | PRN

## 2023-11-18 MED ORDER — PHENYLEPHRINE HCL-NACL 20-0.9 MG/250ML-% IV SOLN
INTRAVENOUS | Status: DC | PRN
Start: 2023-11-18 — End: 2023-11-18
  Administered 2023-11-18: 30 ug/min via INTRAVENOUS

## 2023-11-18 SURGICAL SUPPLY — 53 items
BENZOIN TINCTURE PRP APPL 2/3 (GAUZE/BANDAGES/DRESSINGS) IMPLANT
BIT DRILL 2.8X128 (BIT) ×2 IMPLANT
BLADE HEX COATED 2.75 (ELECTRODE) ×2 IMPLANT
BLADE SAGITTAL 25.0X1.27X90 (BLADE) ×2 IMPLANT
CLOTH BEACON ORANGE TIMEOUT ST (SAFETY) ×2 IMPLANT
COUNTER NDL MAGNETIC 40 RED (SET/KITS/TRAYS/PACK) ×2 IMPLANT
COUNTER NEEDLE MAGNETIC 40 RED (SET/KITS/TRAYS/PACK) ×1 IMPLANT
COVER LIGHT HANDLE STERIS (MISCELLANEOUS) ×4 IMPLANT
DRAPE BACK TABLE (DRAPES) ×2 IMPLANT
DRAPE HIP W/POCKET STRL (MISCELLANEOUS) ×2 IMPLANT
DRAPE U-SHAPE 47X51 STRL (DRAPES) ×2 IMPLANT
DRESSING AQUACEL AG ADV 3.5X12 (MISCELLANEOUS) ×2 IMPLANT
DRSG AQUACEL AG ADV 3.5X10 (GAUZE/BANDAGES/DRESSINGS) IMPLANT
DRSG MEPILEX SACRM 8.7X9.8 (GAUZE/BANDAGES/DRESSINGS) ×2 IMPLANT
DURAPREP 26ML APPLICATOR (WOUND CARE) ×4 IMPLANT
ELECTRODE REM PT RTRN 9FT ADLT (ELECTROSURGICAL) ×2 IMPLANT
GLOVE BIOGEL PI IND STRL 7.0 (GLOVE) ×4 IMPLANT
GLOVE BIOGEL PI IND STRL 8.5 (GLOVE) ×2 IMPLANT
GLOVE SKINSENSE STRL SZ8.0 LF (GLOVE) ×4 IMPLANT
GOWN STRL REUS W/TWL LRG LVL3 (GOWN DISPOSABLE) ×6 IMPLANT
GOWN STRL REUS W/TWL XL LVL3 (GOWN DISPOSABLE) ×2 IMPLANT
HEAD BIPOLAR PROS AML 44 (Hips) IMPLANT
HEAD FEM STD 28X+8.5 STRL (Hips) IMPLANT
HOOD PEEL AWAY T7 (MISCELLANEOUS) ×8 IMPLANT
INST SET MAJOR BONE (KITS) ×2 IMPLANT
KIT TURNOVER KIT A (KITS) ×2 IMPLANT
MANIFOLD NEPTUNE II (INSTRUMENTS) ×2 IMPLANT
MARKER SKIN DUAL TIP RULER LAB (MISCELLANEOUS) ×2 IMPLANT
NDL HYPO 21X1.5 SAFETY (NEEDLE) ×2 IMPLANT
NEEDLE HYPO 21X1.5 SAFETY (NEEDLE) ×1 IMPLANT
NS IRRIG 1000ML POUR BTL (IV SOLUTION) ×2 IMPLANT
PACK TOTAL JOINT (CUSTOM PROCEDURE TRAY) ×2 IMPLANT
PAD ARMBOARD POSITIONER FOAM (MISCELLANEOUS) ×2 IMPLANT
PIN STMN SNGL STERILE 9X3.6MM (PIN) ×4 IMPLANT
POSITIONER HEAD 8X9X4 ADT (SOFTGOODS) ×2 IMPLANT
SET BASIN LINEN APH (SET/KITS/TRAYS/PACK) ×2 IMPLANT
SET HNDPC FAN SPRY TIP SCT (DISPOSABLE) ×2 IMPLANT
SOL .9 NS 3000ML IRR UROMATIC (IV SOLUTION) ×2 IMPLANT
SPONGE T-LAP 18X18 ~~LOC~~+RFID (SPONGE) ×2 IMPLANT
STEM FEMORAL SZ 6MM STD ACTIS (Stem) IMPLANT
STRIP CLOSURE SKIN 1/2X4 (GAUZE/BANDAGES/DRESSINGS) IMPLANT
SUT BRALON NAB BRD #1 30IN (SUTURE) ×4 IMPLANT
SUT ETHIBOND 5 LR DA (SUTURE) ×2 IMPLANT
SUT MNCRL 0 VIOLET CTX 36 (SUTURE) IMPLANT
SUT MON AB 2-0 CT1 36 (SUTURE) IMPLANT
SUT VIC AB 1 CT1 27XBRD ANTBC (SUTURE) IMPLANT
SYR 20ML LL LF (SYRINGE) IMPLANT
SYR 30ML LL (SYRINGE) IMPLANT
SYR BULB IRRIG 60ML STRL (SYRINGE) ×2 IMPLANT
TOWEL OR 17X26 4PK STRL BLUE (TOWEL DISPOSABLE) ×2 IMPLANT
TRAY FOLEY MTR SLVR 16FR STAT (SET/KITS/TRAYS/PACK) ×2 IMPLANT
TUBE CONNECTING 12X1/4 (SUCTIONS) IMPLANT
TUBE SUCTION HIGH CAP CLEAR NV (SUCTIONS) IMPLANT

## 2023-11-18 NOTE — Interval H&P Note (Signed)
 History and Physical Interval Note:  11/18/2023 1:02 PM  Audrey [name]  has presented today for surgery, with the diagnosis of left femoral neck fracture.  The various methods of treatment have been discussed with the patient and family. After consideration of risks, benefits and other options for treatment, the patient has consented to  Procedure(s) with comments: ARTHROPLASTY, HIP, TOTAL (Left) - possible bipolar as a surgical intervention.  The patient's history has been reviewed, patient examined, no change in status, stable for surgery.  I have reviewed the patient's chart and labs.  Questions were answered to the patient's satisfaction.     Taft Minerva

## 2023-11-18 NOTE — Progress Notes (Signed)
 Nutrition Follow-up  DOCUMENTATION CODES:   Non-severe (moderate) malnutrition in context of chronic illness  INTERVENTION:   Glucerna Shake po TID, each supplement provides 220 kcal and 10 grams of protein MVI with minerals daily Magic cup TID with meals, each supplement provides 290 kcal and 9 grams of protein  NUTRITION DIAGNOSIS:   Moderate Malnutrition related to chronic illness (uncontrolled DM) as evidenced by mild muscle depletion, mild fat depletion; new diagnosis.  GOAL:   Patient will meet greater than or equal to 90% of their needs; progressing.  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 Admitted with L femoral neck fracture.  Patient is NPO today for surgery to repair her hip fracture.  She reports recent poor intake at home, typically eats a lot of sweets, but wants to eat healthier after discharge. We discussed good sources of protein and the importance of adequate intake to promote healing and recovery. She says she has not tried the Glucerna shake supplement yet, but is willing to give it a try.  She meets criteria for moderate malnutrition, given mild depletion of muscle and subcutaneous fat mass.  Labs reviewed.  CBG: [phone]  Medications reviewed and include vitamin D3, novolog , lantus , MVI with minerals, protonix . IVF: LR at 30 ml/h  NUTRITION - FOCUSED PHYSICAL EXAM:  Flowsheet Row Most Recent Value  Orbital Region Mild depletion  Upper Arm Region Mild depletion  Thoracic and Lumbar Region Moderate depletion  Buccal Region Mild depletion   Temple Region Mild depletion  Clavicle Bone Region Severe depletion  Clavicle and Acromion Bone Region Severe depletion  Scapular Bone Region Severe depletion  Dorsal Hand Mild depletion  Patellar Region Mild depletion  Anterior Thigh Region Mild depletion  Posterior Calf Region Mild depletion  Edema (RD Assessment) Mild  Hair Reviewed  Eyes Reviewed  Mouth Reviewed  Skin Reviewed  Nails Reviewed    Diet Order:   Diet Order             Diet NPO time specified Except for: Sips with Meds  Diet effective now                   EDUCATION NEEDS:   Education needs have been addressed  Skin:  Skin Assessment: Reviewed RN Assessment  Last BM:  9/10 type 6  Height:   Ht Readings from Last 1 Encounters:  11/16/23 5' 4 (1.626 m)   Weight:   Wt Readings from Last 1 Encounters:  11/16/23 50.5 kg   Ideal Body Weight:  54.5 kg  BMI:  Body mass index is 19.11 kg/m.  Estimated Nutritional Needs:   Kcal:  1500-1700  Protein:  75-90 grams  Fluid:  1.5-1.7 L   Suzen HUNT RD, LDN, CNSC Contact via secure chat. If unavailable, use group chat RD Inpatient.

## 2023-11-18 NOTE — [place] FL2 (Signed)
 [place]  MEDICAID FL2 LEVEL OF CARE FORM     IDENTIFICATION  Patient Name: Audrey [name] Birthdate: 28-Aug-1945 Sex: female Admission Date (Current Location): 11/16/2023  [facility] and [place] Number:  [employer] and Address:  [facility],  [address] [place] [address]      Provider Number: [id number]  Attending Physician Name and Address:  Vicci Afton CROME, MD  Relative Name and Phone Number:  Anab, Vivar (son)  [phone]    Current Level of Care: Hospital Recommended Level of Care: Skilled Nursing Facility Prior Approval Number:    Date Approved/Denied: 11/18/23 PASRR Number: [id number]  Discharge Plan: SNF    Current Diagnoses: Patient Active Problem List   Diagnosis Date Noted   Left displaced femoral neck fracture (HCC) 11/16/2023   Rhabdomyolysis 11/16/2023   Elevated CK 11/16/2023   Left hip pain 11/16/2023   Hyperglycemia 11/16/2023   Type 1 diabetes mellitus without complications (HCC) 04/02/2020   Osteoporosis 03/27/2018   Ingrowing toenail 10/19/2017   Left elbow pain 11/15/2013   S/P pericardial surgery 02/20/2012   Second degree AV block, Mobitz type I 02/18/2012   Pericardial effusion 02/16/2012   Seasonal allergies    Osteopenia     Orientation RESPIRATION BLADDER Height & Weight     Self, Situation, Place  Normal External catheter Weight: 50.5 kg Height:  5' 4 (162.6 cm)  BEHAVIORAL SYMPTOMS/MOOD NEUROLOGICAL BOWEL NUTRITION STATUS      Continent Diet (see dc summary)  AMBULATORY STATUS COMMUNICATION OF NEEDS Skin   Extensive Assist Verbally                         Personal Care Assistance Level of Assistance  Bathing, Feeding, Dressing Bathing Assistance: Maximum assistance Feeding assistance: Independent Dressing Assistance: Maximum assistance     Functional Limitations Info  Sight, Hearing, Speech Sight Info: Adequate Hearing Info: Adequate Speech Info: Adequate    SPECIAL CARE  FACTORS FREQUENCY  PT (By licensed PT), OT (By licensed OT)     PT Frequency: 5x weekly OT Frequency: 5x weekly            Contractures Contractures Info: Not present    Additional Factors Info  Code Status, Allergies Code Status Info: Full Allergies Info: Morphine, Codeine, Januvia (Sitagliptin)           Current Medications (11/18/2023):  This is the current hospital active medication list Current Facility-Administered Medications  Medication Dose Route Frequency Provider Last Rate Last Admin   bisacodyl  (DULCOLAX) EC tablet 5 mg  5 mg Oral Daily PRN Johnson, Clanford L, MD       ceFAZolin  (ANCEF ) IVPB 2g/100 mL premix  2 g Intravenous On Call to OR Margrette Taft BRAVO, MD   Held at 11/18/23 0508   cholecalciferol  (VITAMIN D3) 25 MCG (1000 UNIT) tablet 5,000 Units  5,000 Units Oral Daily Johnson, Clanford L, MD   5,000 Units at 11/18/23 0921   feeding supplement (GLUCERNA SHAKE) (GLUCERNA SHAKE) liquid 237 mL  237 mL Oral TID BM Johnson, Clanford L, MD   237 mL at 11/17/23 2022   fentaNYL  (SUBLIMAZE ) injection 12.5-25 mcg  12.5-25 mcg Intravenous Q2H PRN Madueme, Elvira C, RPH   25 mcg at 11/17/23 1715   hydrALAZINE  (APRESOLINE ) injection 10 mg  10 mg Intravenous Q4H PRN Johnson, Clanford L, MD       HYDROcodone -acetaminophen  (NORCO/VICODIN) 5-325 MG per tablet 1-2 tablet  1-2 tablet Oral Q6H PRN Vicci, Clanford  L, MD   1 tablet at 11/17/23 2023   insulin  aspart (novoLOG ) injection 0-15 Units  0-15 Units Subcutaneous TID WC Johnson, Clanford L, MD   8 Units at 11/18/23 0747   insulin  aspart (novoLOG ) injection 0-5 Units  0-5 Units Subcutaneous QHS Johnson, Clanford L, MD       insulin  aspart (novoLOG ) injection 10 Units  10 Units Subcutaneous TID WC Johnson, Clanford L, MD   10 Units at 11/17/23 1718   insulin  glargine (LANTUS ) injection 30 Units  30 Units Subcutaneous Daily Vicci, Clanford L, MD   30 Units at 11/17/23 1217   lactated ringers  infusion   Intravenous Continuous  Vicci, Clanford L, MD 30 mL/hr at 11/18/23 0923 Restarted at 11/18/23 9076   methocarbamol  (ROBAXIN ) tablet 500 mg  500 mg Oral Q6H PRN Vicci, Clanford L, MD   500 mg at 11/17/23 2023   Or   methocarbamol  (ROBAXIN ) injection 500 mg  500 mg Intravenous Q6H PRN Johnson, Clanford L, MD       multivitamin with minerals tablet 1 tablet  1 tablet Oral Daily Vicci, Clanford L, MD   1 tablet at 11/18/23 9078   mupirocin  ointment (BACTROBAN ) 2 % 1 Application  1 Application Nasal BID Margrette Taft BRAVO, MD   1 Application at 11/18/23 9077   ondansetron  (ZOFRAN ) injection 4 mg  4 mg Intravenous Q6H PRN Johnson, Clanford L, MD       pantoprazole  (PROTONIX ) EC tablet 40 mg  40 mg Oral Q0600 Johnson, Clanford L, MD   40 mg at 11/18/23 0506   povidone-iodine  10 % swab 2 Application  2 Application Topical Once Margrette Taft BRAVO, MD       prochlorperazine  (COMPAZINE ) injection 10 mg  10 mg Intravenous Q4H PRN Johnson, Clanford L, MD       sertraline  (ZOLOFT ) tablet 50 mg  50 mg Oral Daily Johnson, Clanford L, MD   50 mg at 11/18/23 9078   tranexamic acid  (CYKLOKAPRON ) IVPB 1,000 mg  1,000 mg Intravenous To OR Margrette Taft BRAVO, MD   Held at 11/18/23 0320   zolpidem  (AMBIEN ) tablet 5 mg  5 mg Oral QHS PRN Johnson, Clanford L, MD         Discharge Medications: Please see discharge summary for a list of discharge medications.  Relevant Imaging Results:  Relevant Lab Results:   Additional Information SSN: [id number]  Nena LITTIE Coffee, RN

## 2023-11-18 NOTE — Plan of Care (Signed)
  Problem: Health Behavior/Discharge Planning: Goal: Ability to manage health-related needs will improve Outcome: Progressing   Problem: Clinical Measurements: Goal: Ability to maintain clinical measurements within normal limits will improve Outcome: Progressing Goal: Will remain free from infection Outcome: Progressing Goal: Diagnostic test results will improve Outcome: Progressing Goal: Cardiovascular complication will be avoided Outcome: Progressing   Problem: Nutrition: Goal: Adequate nutrition will be maintained Outcome: Progressing   Problem: Coping: Goal: Level of anxiety will decrease Outcome: Progressing   Problem: Elimination: Goal: Will not experience complications related to bowel motility Outcome: Progressing Goal: Will not experience complications related to urinary retention Outcome: Progressing   Problem: Pain Managment: Goal: General experience of comfort will improve and/or be controlled Outcome: Progressing   Problem: Safety: Goal: Ability to remain free from injury will improve Outcome: Progressing   Problem: Skin Integrity: Goal: Risk for impaired skin integrity will decrease Outcome: Progressing

## 2023-11-18 NOTE — Inpatient Diabetes Management (Addendum)
 Inpatient Diabetes Program Recommendations  AACE/ADA: New Consensus Statement on Inpatient Glycemic Control   Target Ranges:  Prepandial:   less than 140 mg/dL      Peak postprandial:   less than 180 mg/dL (1-2 hours)      Critically ill patients:  140 - 180 mg/dL    Latest Reference Range & Units 11/18/23 05:10 11/18/23 07:16  Glucose-Capillary 70 - 99 mg/dL 706 (H) 731 (H)    Latest Reference Range & Units 11/17/23 05:32 11/17/23 07:20 11/17/23 11:25 11/17/23 15:56 11/17/23 20:14  Glucose-Capillary 70 - 99 mg/dL 738 (H) 731 (H) 542 (H) 185 (H) 82   Review of Glycemic Control  Diabetes history: DM2 Outpatient Diabetes medications: Tresiba  20 units at bedtime, 70/30 10 units with breakfast Current orders for Inpatient glycemic control: Lantus  30 units daily, Novolog  0-15 units TID with meals, Novolog  0-5 units at bedtime, Novolog  10 units TID with meals  Inpatient Diabetes Program Recommendations:    Insulin : CBG 268 mg/dl this morning. Please consider increasing Lantus  to 33 units daily.  HbgA1C: A1C 10.2% on 11/16/23 indicating an average glucose of 246 mg/dl over the past 2-3 months.  Addendum 11/18/23@11 :30-Spoke with patient at bedside regarding DM control. Patient alert and oriented but slow to respond to questions but does answer appropriately. Patient reports that she takes Tresiba  20 units at bedtime and 70/30 10 units with breakfast at home for DM control. Patient reports that she takes her medications consistently. She reports she uses a [employer] CGM for glucose monitoring. She states that her glucose is up and down but usually runs high.  Patient reports that her prior A1C was 12%.  Discussed A1C results (10.2% on 11/16/23) and explained that current A1C indicates an average glucose of 246 mg/dl over the past 2-3 months. Discussed glucose and A1C goals. Discussed importance of checking CBGs and maintaining good CBG control to prevent long-term and short-term complications.  Explained how hyperglycemia leads to damage within blood vessels which lead to the common complications seen with uncontrolled diabetes. Stressed to the patient the importance of improving glycemic control to prevent further complications from uncontrolled diabetes especially following surgery.  Encouraged patient to follow up with Dr. Zollie regarding DM management.  Patient verbalized understanding of information discussed and reports no further questions at this time related to diabetes.   Thanks, Earnie Gainer, RN, MSN, CDCES Diabetes Coordinator Inpatient Diabetes Program [phone] (Team Pager from 8am to 5pm)

## 2023-11-18 NOTE — Interval H&P Note (Signed)
 History and Physical Interval Note:  11/18/2023 1:02 PM  BP (!) 145/67   Pulse 82   Temp 98.4 F (36.9 C) (Oral)   Resp 19   Ht 5' 4 (1.626 m)   Wt 48.1 kg   SpO2 99%   BMI 18.19 kg/m      Latest Ref Rng & Units 11/18/2023    4:28 AM 11/17/2023    4:40 AM 11/16/2023   11:07 AM  CBC  WBC 4.0 - 10.5 K/uL 6.2  6.7  8.5   Hemoglobin 12.0 - 15.0 g/dL 89.2  89.6  86.7   Hematocrit 36.0 - 46.0 % 32.1  31.8  39.7   Platelets 150 - 400 K/uL 194  188  206      Audrey [name]  has presented today for surgery, with the diagnosis of left femoral neck fracture.  The various methods of treatment have been discussed with the patient and family. After consideration of risks, benefits and other options for treatment, the patient has consented to  Procedure(s) with comments: ARTHROPLASTY, HIP, TOTAL (Left) - possible bipolar as a surgical intervention.  The patient's history has been reviewed, patient examined, no change in status, stable for surgery.  I have reviewed the patient's chart and labs.  Questions were answered to the patient's satisfaction.     Taft Minerva

## 2023-11-18 NOTE — Progress Notes (Signed)
 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  She has been unable to get up and walk due to the severe pain.  She ended up lying on the floor most of the night and soiled herself with stool.  Her son called to check-in on her and because she was unable to answer the phone he came over to check on her then called 911 when he found her on the floor.  Patient reportedly did not hit her head.  She mostly complained of left side and hip pain.  She was noted to have a blood glucose of 327.  In the ED she was evaluated normal chest x-ray, but her hip x-ray showed an acute left subcapital femoral neck fracture.  Orthopedics was consulted and Dr. Margrette planning to see patient and take to the OR.  Medicine is admitting for further management.   Assessment and Plan:  Acute left subcapital femoral neck fracture -- admit to med surg -- ortho evaluation with plans for surgery later today for ORIF -- IV pain and nausea medication -- Bed rest orders -- 25-OH vitamin D  is 68 reassuring -- fall precautions  -- supportive measures ordered   Rhabdomyolysis - resolved -- IV fluid hydration ordered -- follow CK to improvement  -- CK down to 270    Type 1 DM - Uncontrolled with hyperglycemia -- A1c - 10.2%  -- SSI coverage ordered -- frequent CBG monitoring -- serum ketones reassuring pt not in DKA -- increased semglee  to 30 units, increased prandial novolog  to 10 units TID, increased SSI coverage  CBG (last 3)   Recent Labs    11/18/23 0716 11/18/23 1059 11/18/23 1224  GLUCAP 268* 113* 107*    Essential hypertension -- IV hydralazine  ordered while NPO -- resumed home lisinopril     Hyperlipidemia -- held home pravastatin  due to elevated CK level on admission  DVT prophylaxis: sq heparin  Code Status: Full  Family Communication:  Disposition: TBD    Consultants:  Orthopedics  Procedures:  Pending ORIF left hip  Antimicrobials:    Subjective: Agreeable to surgery today   Objective: Vitals:   11/17/23 1335 11/17/23 2010 11/18/23 0525 11/18/23 1228  BP: (!) 133/58 (!) 152/64 (!) 157/70 (!) 145/67  Pulse: 92 87 82 82  Resp:  17 20 19   Temp: 98.2 F (36.8 C) 99.6 F (37.6 C) 98.6 F (37 C) 98.4 F (36.9 C)  TempSrc: Oral Oral Oral Oral  SpO2: 94% 94% 97% 99%  Weight:    48.1 kg  Height:    5' 4 (1.626 m)    Intake/Output Summary (Last 24 hours) at 11/18/2023 1302 Last data filed at 11/18/2023 0518 Gross per 24 hour  Intake 621.39 ml  Output 1625 ml  Net -1003.61 ml   Filed Weights   11/16/23 1033 11/16/23 1451 11/18/23 1228  Weight: 49 kg 50.5 kg 48.1 kg   Examination:  General exam: Appears calm and not in distress but not fully comfortable.  Respiratory system: Clear to auscultation. Respiratory effort normal. Cardiovascular  system: normal S1 & S2 heard. No JVD, murmurs, rubs, gallops or clicks. No pedal edema. Gastrointestinal system: Abdomen is nondistended, soft and nontender. No organomegaly or masses felt. Normal bowel sounds heard. Central nervous system: Alert and oriented. No focal neurological deficits. Extremities: shortened ext rotated left leg. warm feet and palpable pulses left foot. Skin: No rashes, lesions or ulcers. Psychiatry: Judgement and insight appear normal. Mood & affect appropriate.   Data Reviewed: I have personally reviewed following labs and imaging studies  CBC: Recent Labs  Lab 11/16/23 1107 11/17/23 0440 11/18/23 0428  WBC  8.5 6.7 6.2  NEUTROABS 7.5  --   --   HGB 13.2 10.3* 10.7*  HCT 39.7 31.8* 32.1*  MCV 92.3 93.3 93.0  PLT 206 188 194    Basic Metabolic Panel: Recent Labs  Lab 11/16/23 1107 11/17/23 0440  NA 140 136  K 4.0 3.7  CL 100 102  CO2 26 23  GLUCOSE 247* 265*  BUN 13 14  CREATININE 0.73 0.67  CALCIUM 9.3 8.3*    CBG: Recent Labs  Lab 11/17/23 2014 11/18/23 0510 11/18/23 0716 11/18/23 1059 11/18/23 1224  GLUCAP 82 293* 268* 113* 107*    Recent Results (from the past 240 hours)  Surgical PCR screen     Status: Abnormal   Collection Time: 11/18/23  4:23 AM   Specimen: Nasal Mucosa; Nasal Swab  Result Value Ref Range Status   MRSA, PCR NEGATIVE NEGATIVE Final   Staphylococcus aureus POSITIVE (A) NEGATIVE Final    Comment: RESULT CALLED TO, READ BACK BY AND VERIFIED WITH: (NOTE) The Xpert SA Assay (FDA approved for NASAL specimens in patients 88 years of age and older), is one component of a comprehensive surveillance program. It is not intended to diagnose infection nor to guide or monitor treatment. Performed at [facility], [address], [place], [place] [address]      Radiology Studies: No results found.  Scheduled Meds:  [MAR Hold] cholecalciferol   5,000 Units Oral Daily   [MAR Hold] feeding supplement (GLUCERNA SHAKE)  237 mL Oral TID BM   [MAR Hold] insulin  aspart  0-15 Units Subcutaneous TID WC   [MAR Hold] insulin  aspart  0-5 Units Subcutaneous QHS   [MAR Hold] insulin  aspart  10 Units Subcutaneous TID WC   [MAR Hold] insulin  glargine  30 Units Subcutaneous Daily   [MAR Hold] multivitamin with minerals  1 tablet Oral Daily   [MAR Hold] mupirocin  ointment  1 Application Nasal BID   [MAR Hold] pantoprazole   40 mg Oral Q0600   [MAR Hold] sertraline   50 mg Oral Daily   Continuous Infusions:  ceFAZolin       ceFAZolin  (ANCEF ) IV Stopped (11/18/23 0508)   lactated ringers  30 mL/hr at 11/18/23 1242   tranexamic acid      tranexamic acid  Stopped (11/18/23  0320)    LOS: 2 days   Time spent: 50 mins  Burrel Legrand Vicci, MD How to contact the [facility] Attending or Consulting provider 7A - 7P or covering provider during after hours 7P -7A, for this patient?  Check the care team in [facility] and look for a) attending/consulting TRH provider listed and b) the TRH team listed Log into [email or web address] to find provider on call.  Locate the [facility] provider you are looking for under [facility] and page to a number that you can be directly reached. If you still have difficulty reaching the provider, please page the [facility] (Director on Call) for the Hospitalists listed on amion for  assistance.  11/18/2023, 1:02 PM

## 2023-11-18 NOTE — TOC Initial Note (Signed)
 Transition of Care [facility]) - Initial/Assessment Note   Patient Details  Name: Audrey [name] MRN: [id number] Date of Birth: 03-Mar-1946  [facility] CM/SW Contact:    Nena LITTIE Coffee, RN Phone Number: 11/18/2023, 10:09 AM  Clinical Narrative:                 78 y/o female from home alone, admitted c/hip fracture. OR today c/[place]. Pt plans to return home after rehab. PT eval pending. PASRR: [id number] . FL2 started.   Expected Discharge Plan: Skilled Nursing Facility Barriers to Discharge: Continued Medical Work up  Patient Goals and CMS Choice Patient states their goals for this hospitalization and ongoing recovery are:: Return home ASAP CMS Medicare.gov Compare Post Acute Care list provided to:: Patient Choice offered to / list presented to : Patient [place] ownership interest in [facility].provided to:: Patient   Expected Discharge Plan and Services In-house Referral: Clinical Social Work Discharge Planning Services: CM Consult Post Acute Care Choice: Skilled Nursing Facility Living arrangements for the past 2 months: Single Family Home       Prior Living Arrangements/Services Living arrangements for the past 2 months: Single Family Home Lives with:: Self Patient language and need for interpreter reviewed:: Yes Do you feel safe going back to the place where you live?: Yes      Need for Family Participation in Patient Care: Yes (Comment) Care giver support system in place?: Yes (comment) Current home services: DME Criminal Activity/Legal Involvement Pertinent to Current Situation/Hospitalization: No - Comment as needed  Activities of Daily Living   ADL Screening (condition at time of admission) Independently performs ADLs?: No Does the patient have a NEW difficulty with bathing/dressing/toileting/self-feeding that is expected to last >3 days?: Yes (Initiates electronic notice to provider for possible OT consult) Does the patient have a NEW  difficulty with getting in/out of bed, walking, or climbing stairs that is expected to last >3 days?: Yes (Initiates electronic notice to provider for possible PT consult) Does the patient have a NEW difficulty with communication that is expected to last >3 days?: No Is the patient deaf or have difficulty hearing?: No Does the patient have difficulty seeing, even when wearing glasses/contacts?: No Does the patient have difficulty concentrating, remembering, or making decisions?: No  Permission Sought/Granted Permission sought to share information with : Case Manager, Family Supports, Magazine features editor Permission granted to share information with : Yes, Verbal Permission Granted    Emotional Assessment Appearance:: Appears stated age Attitude/Demeanor/Rapport: Engaged Affect (typically observed): Appropriate, Calm Orientation: : Oriented to Self, Oriented to Place, Oriented to Situation Alcohol / Substance Use: Not Applicable Psych Involvement: No (comment)  Admission diagnosis:  Closed left hip fracture (HCC) [S72.002A] Patient Active Problem List   Diagnosis Date Noted   Left displaced femoral neck fracture (HCC) 11/16/2023   Rhabdomyolysis 11/16/2023   Elevated CK 11/16/2023   Left hip pain 11/16/2023   Hyperglycemia 11/16/2023   Type 1 diabetes mellitus without complications (HCC) 04/02/2020   Osteoporosis 03/27/2018   Ingrowing toenail 10/19/2017   Left elbow pain 11/15/2013   S/P pericardial surgery 02/20/2012   Second degree AV block, Mobitz type I 02/18/2012   Pericardial effusion 02/16/2012   Seasonal allergies    Osteopenia    PCP:  Zollie Lowers, MD Pharmacy:   [facility] [address] [place] - [address] [place] [address] Phone: [phone] Fax: [phone]  Social Drivers of Health (SDOH) Social History: SDOH  Screenings   Food Insecurity: No Food Insecurity (11/16/2023)  Housing: Low Risk  (11/16/2023)   Transportation Needs: No Transportation Needs (11/16/2023)  Utilities: Not At Risk (11/16/2023)  Depression (PHQ2-9): Low Risk  (10/13/2023)  Social Connections: Moderately Integrated (11/16/2023)  Tobacco Use: Low Risk  (11/17/2023)   SDOH Interventions:   Readmission Risk Interventions    11/16/2023    1:20 PM  Readmission Risk Prevention Plan  Medication Screening Complete  Transportation Screening Complete

## 2023-11-18 NOTE — Transfer of Care (Signed)
 Immediate Anesthesia Transfer of Care Note  Patient: Audrey [name]  Procedure(s) Performed: LEFT BIPOLAR HIP (Left: Hip)  Patient Location: PACU  Anesthesia Type:Spinal  Level of Consciousness: awake  Airway & Oxygen Therapy: Patient Spontanous Breathing  Post-op Assessment: Report given to RN and Post -op Vital signs reviewed and stable  Post vital signs: Reviewed and stable  Last Vitals:  Vitals Value Taken Time  BP 97/42   Temp 97.8   Pulse 73 11/18/23 15:35  Resp 15   SpO2 99 % 11/18/23 15:35  Vitals shown include unfiled device data.  Last Pain:  Vitals:   11/18/23 1228  TempSrc: Oral  PainSc: 8       Patients Stated Pain Goal: 5 (11/18/23 1228)  Complications: No notable events documented.

## 2023-11-18 NOTE — Anesthesia Procedure Notes (Signed)
 Procedure Name: MAC Date/Time: 11/18/2023 1:17 PM  Performed by: Harrold Macintosh, CRNAPre-anesthesia Checklist: Patient identified, Emergency Drugs available, Suction available, Patient being monitored and Timeout performed Oxygen Delivery Method: Simple face mask Preoxygenation: Pre-oxygenation with 100% oxygen Placement Confirmation: positive ETCO2 Dental Injury: Teeth and Oropharynx as per pre-operative assessment

## 2023-11-18 NOTE — Op Note (Signed)
 Orthopaedic Surgery Operative Note (CSN: [id number])  OCIE STANZIONE  1945-04-28 Date of Surgery: 11/18/2023   Diagnoses:  left femoral neck fracture  Procedure: Partial left hip replacement/bipolar  TXA [used/not used] used   Operative Finding Comminuted fracture of the left femoral head and neck  Note no acetabular wear was seen.  Head size was a 44 smallest acetabular cup is 48  Post-Op Diagnosis: Same Surgeons:Primary: Margrette Taft BRAVO, MD Assistants: Kristi Free Location: [facility] OR ROOM 4 Anesthesia: Spinal Antibiotics: Ancef  2 g  Estimated Blood Loss: 50 cc Complications: None Specimens: None   Implants: Implant Name Type Inv. Item Serial No. Manufacturer Lot No. LRB No. Used Action  STEM FEMORAL SZ STD ACTIS - [id number] Stem STEM FEMORAL SZ STD ACTIS  [employer] [id number] Left 1 Implanted  HEAD BIPOLAR PROS AML 44 - [id number] Hips HEAD BIPOLAR PROS AML 44  [employer] [id number] Left 1 Implanted  HEAD FEM STD 28X+8.5 STRL - [id number] Hips HEAD FEM STD 28X+8.5 STRL  [employer] [id number] Left 1 Implanted     Indication for surgery comminuted left femoral neck fracture Transexamic  acid was given yes  DRAINS: none   LOCAL MEDICATIONS USED: Marcaine  with epinephrine    SPECIMEN:  No Specimen  DISPOSITION OF SPECIMEN:  N/A  COUNTS: correct  DICTATION: .Dragon Dictation   The patient was taken to the recovery room in stable condition  PLAN OF CARE: Routine  PATIENT DISPOSITION:  PACU - hemodynamically stable.   Delay start of Pharmacological VTE agent (>24hrs) due to surgical blood loss or risk of bleeding: Yes  Details of surgery:  The patient was identified in the preop area surgical site was confirmed as left hip.  A review of the imaging studies was performed.  Implants availability was checked.  The patient was brought to the operating room for spinal anesthetic followed by insertion of Foley catheter.  She was placed  on the operating table and then placed in lateral decubitus position with axillary roll in the right axilla left side up.  Stulberg hip positioner was used to hold the patient in lateral position padding was placed on the lower extremities.  After sterile prep and drape appropriate timeout was completed and the procedure was confirmed antibiotics were given and TXA was also given IV  The incision was made over the lateral portion of the hip direct lateral approach was performed  The subcutaneous tissue was divided down to the fascia the fascia was split in line with the skin incision  The patient had a large trochanteric bursa which was excised.  The hematoma that occurred from the injury was evacuated.  The abductors was split with a muscle-splitting dissection with my fingers and then the leg was placed in external rotation and in slight hip flexion and the greater trochanteric area was dissected to remove the gluteus medius and minimus and then the capsule was excised.  2 wing retractors were placed in the acetabulum to hold the soft tissue back.  The head was removed in parts and piece back together measured a 44  I inspected the acetabular was nowhere.  I made a decision at this point not to ream her up to a 48 to put a total hip in.  It did not look like she needed a total hip based on her acetabular cartilage and also the reaming up to a 48 would remove bone and her bone was not of good quality  At that point decided to go  with a bipolar replacement  We repaired the proximal femur per manufacture technique and broached up to a size 6 and then did a trial reduction with a 5 and an 8.5 neck length with standard offset.  The reduction with the 8.5 restored the leg length and the hip was stable throughout the range of motion including flexion internal rotation extension external rotation sleeping position shuck test.  We irrigated the wound check the acetabulum for any bony debris  We  drilled 3 holes in the proximal femur and passed to #5 Ethibond sutures.  The implant was then placed the head was placed the hip was reduced.  We repeated the range of motion checked the hip was stable  We irrigated then injected the soft tissues with Marcaine  with epinephrine   We repaired the abductors with #5 Ethibond suture and then proceeded to oversew the area with #1 Vicryl  The hip was abducted #1 Braylon was used to close the fascia  The subcutaneous tissue was closed with 0 Monocryl in interrupted fashion followed by 2-0 Monocryl and Steri-Strips   The sterile dressing was applied  The patient was taken to recovery room in stable condition Postop plan  Weightbearing as tolerated Direct lateral hip precautions DVT prophylaxis for 30 days Postop appointment scheduled for 28 days  [id number]

## 2023-11-18 NOTE — Anesthesia Preprocedure Evaluation (Addendum)
 Anesthesia Evaluation  Patient identified by MRN, date of birth, ID band Patient awake    Reviewed: Allergy & Precautions, H&P , NPO status , Patient's Chart, lab work & pertinent test results  History of Anesthesia Complications (+) PONV and history of anesthetic complications  Airway Mallampati: II  TM Distance: >3 FB Neck ROM: Full    Dental no notable dental hx.    Pulmonary neg pulmonary ROS   Pulmonary exam normal breath sounds clear to auscultation       Cardiovascular Normal cardiovascular exam+ dysrhythmias  Rhythm:Regular Rate:Normal     Neuro/Psych  Neuromuscular disease  negative psych ROS   GI/Hepatic negative GI ROS, Neg liver ROS,,,  Endo/Other  diabetes, Insulin  Dependent    Renal/GU negative Renal ROS  negative genitourinary   Musculoskeletal negative musculoskeletal ROS (+)    Abdominal   Peds negative pediatric ROS (+)  Hematology negative hematology ROS (+)   Anesthesia Other Findings   Reproductive/Obstetrics negative OB ROS                              Anesthesia Physical Anesthesia Plan  ASA: 2  Anesthesia Plan: Spinal   Post-op Pain Management:    Induction:   PONV Risk Score and Plan:   Airway Management Planned:   Additional Equipment:   Intra-op Plan:   Post-operative Plan:   Informed Consent: I have reviewed the patients History and Physical, chart, labs and discussed the procedure including the risks, benefits and alternatives for the proposed anesthesia with the patient or authorized representative who has indicated his/her understanding and acceptance.     Dental advisory given  Plan Discussed with: CRNA  Anesthesia Plan Comments:          Anesthesia Quick Evaluation

## 2023-11-19 DIAGNOSIS — M25552 Pain in left hip: Secondary | ICD-10-CM | POA: Diagnosis not present

## 2023-11-19 DIAGNOSIS — E109 Type 1 diabetes mellitus without complications: Secondary | ICD-10-CM | POA: Diagnosis not present

## 2023-11-19 DIAGNOSIS — S72002A Fracture of unspecified part of neck of left femur, initial encounter for closed fracture: Secondary | ICD-10-CM | POA: Diagnosis not present

## 2023-11-19 DIAGNOSIS — R748 Abnormal levels of other serum enzymes: Secondary | ICD-10-CM | POA: Diagnosis not present

## 2023-11-19 LAB — GLUCOSE, CAPILLARY
Glucose-Capillary: 247 mg/dL — ABNORMAL HIGH (ref 70–99)
Glucose-Capillary: 211 mg/dL — ABNORMAL HIGH (ref 70–99)
Glucose-Capillary: 171 mg/dL — ABNORMAL HIGH (ref 70–99)
Glucose-Capillary: 136 mg/dL — ABNORMAL HIGH (ref 70–99)
Glucose-Capillary: 232 mg/dL — ABNORMAL HIGH (ref 70–99)

## 2023-11-19 LAB — BASIC METABOLIC PANEL WITH GFR
Anion gap: 9 (ref 5–15)
BUN: 15 mg/dL (ref 8–23)
CO2: 26 mmol/L (ref 22–32)
Calcium: 8.2 mg/dL — ABNORMAL LOW (ref 8.9–10.3)
Chloride: 100 mmol/L (ref 98–111)
Creatinine, Ser: 0.72 mg/dL (ref 0.44–1.00)
GFR, Estimated: >60 mL/min (ref >60)
Glucose, Bld: 226 mg/dL — ABNORMAL HIGH (ref 70–99)
Potassium: 4 mmol/L (ref 3.5–5.1)
Sodium: 135 mmol/L (ref 135–145)

## 2023-11-19 LAB — CBC
HCT: 30.8 % — ABNORMAL LOW (ref 36.0–46.0)
Hemoglobin: 10.2 g/dL — ABNORMAL LOW (ref 12.0–15.0)
MCH: 30.8 pg (ref 26.0–34.0)
MCHC: 33.1 g/dL (ref 30.0–36.0)
MCV: 93.1 fL (ref 80.0–100.0)
Platelets: 190 K/uL (ref 150–400)
RBC: 3.31 MIL/uL — ABNORMAL LOW (ref 3.87–5.11)
RDW: 13.2 % (ref 11.5–15.5)
WBC: 5.9 K/uL (ref 4.0–10.5)
nRBC: 0 % (ref 0.0–0.2)

## 2023-11-19 MED ORDER — FENTANYL CITRATE PF 50 MCG/ML IJ SOSY
12.5000 ug | PREFILLED_SYRINGE | INTRAMUSCULAR | Status: DC | PRN
Start: 2023-11-19 — End: 2023-11-21
  Administered 2023-11-19: 12.5 ug via INTRAVENOUS
  Filled 2023-11-19: qty 1

## 2023-11-19 MED ORDER — PRAVASTATIN SODIUM 40 MG PO TABS
80.0000 mg | ORAL_TABLET | Freq: Every day | ORAL | Status: DC
  Administered 2023-11-19 – 2023-11-21 (×3): 80 mg via ORAL
  Filled 2023-11-19 (×3): qty 2

## 2023-11-19 MED ORDER — ACETAMINOPHEN 325 MG PO TABS
650.0000 mg | ORAL_TABLET | Freq: Four times a day (QID) | ORAL | Status: DC | PRN
  Administered 2023-11-19: 650 mg via ORAL
  Filled 2023-11-19: qty 2

## 2023-11-19 NOTE — Plan of Care (Signed)
  Problem: Acute Rehab PT Goals(only PT should resolve) Goal: Pt Will Go Supine/Side To Sit Outcome: Progressing Flowsheets (Taken 11/19/2023 0920) Pt will go Supine/Side to Sit: with minimal assist Goal: Patient Will Transfer Sit To/From Stand Outcome: Progressing Flowsheets (Taken 11/19/2023 0920) Patient will transfer sit to/from stand: with minimal assist Goal: Pt Will Transfer Bed To Chair/Chair To Bed Outcome: Progressing Flowsheets (Taken 11/19/2023 0920) Pt will Transfer Bed to Chair/Chair to Bed:  with min assist  with mod assist Goal: Pt Will Ambulate Outcome: Progressing Flowsheets (Taken 11/19/2023 0920) Pt will Ambulate:  25 feet  with minimal assist  with rolling walker  Lang Ada, PT, DPT [facility] Office: [phone] 9:21 AM, 11/19/23

## 2023-11-19 NOTE — Anesthesia Postprocedure Evaluation (Signed)
 Anesthesia Post Note  Patient: Audrey [name]  Procedure(s) Performed: LEFT BIPOLAR HIP (Left: Hip)  Patient location during evaluation: PACU Anesthesia Type: Spinal Level of consciousness: oriented and awake and alert Pain management: pain level controlled Vital Signs Assessment: post-procedure vital signs reviewed and stable Respiratory status: spontaneous breathing, respiratory function stable and patient connected to nasal cannula oxygen Cardiovascular status: blood pressure returned to baseline and stable Postop Assessment: no headache, no backache and no apparent nausea or vomiting Anesthetic complications: no   No notable events documented.   Last Vitals:  Vitals:   11/19/23 0237 11/19/23 0500  BP: (!) 144/68 (!) 151/68  Pulse: 97 (!) 102  Resp: 18 18  Temp: 36.8 C 37.7 C  SpO2: 93% 92%    Last Pain:  Vitals:   11/19/23 0906  TempSrc:   PainSc: 9                  Andrea Limes

## 2023-11-19 NOTE — Progress Notes (Signed)
 Subjective: 1 Day Post-Op Procedure(s) (LRB): LEFT BIPOLAR HIP (Left) Patient reports pain as moderate.    Objective: Vital signs in last 24 hours: Temp:  [98.2 F (36.8 C)-99.9 F (37.7 C)] 99.9 F (37.7 C) (09/13 0500) Pulse Rate:  [78-102] 102 (09/13 0500) Resp:  [15-20] 18 (09/13 0500) BP: (101-152)/(55-77) 151/68 (09/13 0500) SpO2:  [92 %-99 %] 92 % (09/13 0500) Weight:  [48.1 kg] 48.1 kg (09/12 1228)  Intake/Output from previous day: 09/12 0701 - 09/13 0700 In: 1340 [P.O.:240; I.V.:900; IV Piggyback:200] Out: 2200 [Urine:2100; Blood:100] Intake/Output this shift: No intake/output data recorded.  Recent Labs    11/16/23 1107 11/17/23 0440 11/18/23 0428 11/19/23 0452  HGB 13.2 10.3* 10.7* 10.2*   Recent Labs    11/18/23 0428 11/19/23 0452  WBC 6.2 5.9  RBC 3.45* 3.31*  HCT 32.1* 30.8*  PLT 194 190   Recent Labs    11/17/23 0440 11/19/23 0452  NA 136 135  K 3.7 4.0  CL 102 100  CO2 23 26  BUN 14 15  CREATININE 0.67 0.72  GLUCOSE 265* 226*  CALCIUM 8.3* 8.2*   No results for input(s): LABPT, INR in the last 72 hours.  Neurologically intact Neurovascular intact Sensation intact distally Intact pulses distally Incision: dressing C/D/I Compartment soft The thigh is swollen and I think the ted hose is contributing to that  Change to knee hi teds   Assessment/Plan: 1 Day Post-Op Procedure(s) (LRB): LEFT BIPOLAR HIP (Left) Review pain meds and adjust as needed   Insulin  for glucose control   Therapy as tolerated    Taft Minerva 11/19/2023, 10:28 AM

## 2023-11-19 NOTE — Progress Notes (Signed)
 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  She has been unable to get up and walk due to the severe pain.  She ended up lying on the floor most of the night and soiled herself with stool.  Her son called to check-in on her and because she was unable to answer the phone he came over to check on her then called 911 when he found her on the floor.  Patient reportedly did not hit her head.  She mostly complained of left side and hip pain.  She was noted to have a blood glucose of 327.  In the ED she was evaluated normal chest x-ray, but her hip x-ray showed an acute left subcapital femoral neck fracture.  Orthopedics was consulted and Dr. Margrette planning to see patient and take to the OR.  Medicine is admitting for further management.   Assessment and Plan:  Acute left subcapital femoral neck fracture -- admit to med surg -- ortho evaluation and taken to OR on 9/12 for partial left hip replacement/bipolar -- IV pain and nausea medication -- postop orders per ortho -- 25-OH vitamin D  is 68 reassuring -- fall precautions  -- supportive measures ordered -- PT eval with plan for SNF placement for acute rehab   Rhabdomyolysis - resolved -- IV fluid hydration ordered -- follow CK to improvement  -- CK down to 270 and stopped checking   Type 1 DM - Uncontrolled with hyperglycemia -- A1c - 10.2%  -- SSI coverage ordered -- frequent CBG monitoring -- serum ketones reassuring pt not in DKA --  increased semglee  to 30 units, increased prandial novolog  to 10 units TID, increased SSI coverage  CBG (last 3)  Recent Labs    11/19/23 0306 11/19/23 0706 11/19/23 1116  GLUCAP 247* 211* 171*   Essential hypertension -- IV hydralazine  ordered while NPO -- resumed home lisinopril     Hyperlipidemia -- held home pravastatin  due to elevated CK level on admission -- resumed pravastatin  now that CK levels are down  DVT prophylaxis: sq heparin  Code Status: Full  Family Communication:  Disposition: awaiting SNF placement     Consultants:  Orthopedics   Procedures:  ORIF left hip: partial left hip replacement/bipolar 9/12   Antimicrobials:    Subjective: Pt reports she had good appetite this morning, pain is manageable. Pt says she is willing to work with PT  Objective: Vitals:   11/18/23 1955 11/19/23 0237 11/19/23 0500 11/19/23 1229  BP: (!) 152/63 (!) 144/68 (!) 151/68 (!) 131/51  Pulse: 87 97 (!) 102 94  Resp: 20 18 18 17   Temp: 99.5 F (37.5 C) 98.2 F (36.8 C) 99.9 F (37.7 C) 98.6 F (37 C)  TempSrc: Oral Oral Oral Oral  SpO2: 92% 93% 92% 95%  Weight:      Height:        Intake/Output Summary (Last 24 hours) at 11/19/2023 1259 Last data filed at 11/19/2023 0500 Gross per 24 hour  Intake 1340 ml  Output 2200 ml  Net -860 ml  Filed Weights   11/16/23 1033 11/16/23 1451 11/18/23 1228  Weight: 49 kg 50.5 kg 48.1 kg   Examination:  General exam: Appears calm and not in distress but not fully comfortable.  Respiratory system: Clear to auscultation. Respiratory effort normal. Cardiovascular system: normal S1 & S2 heard. No JVD, murmurs, rubs, gallops or clicks. No pedal edema. Gastrointestinal system: Abdomen is nondistended, soft and nontender. No organomegaly or masses felt. Normal bowel sounds heard. Central nervous system: Alert and oriented. No focal neurological deficits. Extremities: wounds clean and dry, warm feet and palpable pulses left  foot. Skin: No rashes, lesions or ulcers. Psychiatry: Judgement and insight appear normal. Mood & affect appropriate.   Data Reviewed: I have personally reviewed following labs and imaging studies  CBC: Recent Labs  Lab 11/16/23 1107 11/17/23 0440 11/18/23 0428 11/19/23 0452  WBC 8.5 6.7 6.2 5.9  NEUTROABS 7.5  --   --   --   HGB 13.2 10.3* 10.7* 10.2*  HCT 39.7 31.8* 32.1* 30.8*  MCV 92.3 93.3 93.0 93.1  PLT 206 188 194 190    Basic Metabolic Panel: Recent Labs  Lab 11/16/23 1107 11/17/23 0440 11/19/23 0452  NA 140 136 135  K 4.0 3.7 4.0  CL 100 102 100  CO2 26 23 26   GLUCOSE 247* 265* 226*  BUN 13 14 15   CREATININE 0.73 0.67 0.72  CALCIUM 9.3 8.3* 8.2*    CBG: Recent Labs  Lab 11/18/23 1637 11/18/23 1957 11/19/23 0306 11/19/23 0706 11/19/23 1116  GLUCAP 103* 251* 247* 211* 171*    Recent Results (from the past 240 hours)  Surgical PCR screen     Status: Abnormal   Collection Time: 11/18/23  4:23 AM   Specimen: Nasal Mucosa; Nasal Swab  Result Value Ref Range Status   MRSA, PCR NEGATIVE NEGATIVE Final   Staphylococcus aureus POSITIVE (A) NEGATIVE Final    Comment: RESULT CALLED TO, READ BACK BY AND VERIFIED WITH: (NOTE) The Xpert SA Assay (FDA approved for NASAL specimens in patients 14 years of age and older), is one component of a comprehensive surveillance program. It is not intended to diagnose infection nor to guide or monitor treatment. Performed at [facility], [address] [place], [place] [address]      Radiology Studies: DG HIP UNILAT WITH PELVIS 2-3 VIEWS LEFT Result Date: 11/18/2023 CLINICAL DATA:  Postoperative evaluation. EXAM: DG HIP (WITH OR WITHOUT PELVIS) 2-3V LEFT COMPARISON:  11/16/2023. FINDINGS: Status post left hip bipolar hemiarthroplasty with normal alignment. No acute fracture. Expected postoperative soft tissue swelling and soft tissue air along the left hip. IMPRESSION: Status post left hip hemiarthroplasty with normal  alignment. No acute complication. Electronically Signed   By: Harrietta Sherry M.D.   On: 11/18/2023 16:28    Scheduled Meds:  acetaminophen   500 mg Oral Once   cholecalciferol   5,000 Units Oral Daily   docusate sodium   100 mg Oral BID   enoxaparin  (LOVENOX ) injection  40 mg Subcutaneous Q24H   feeding supplement (GLUCERNA SHAKE)  237 mL Oral TID BM   insulin  aspart  0-15 Units Subcutaneous TID WC   insulin  aspart  0-5 Units Subcutaneous QHS   insulin  aspart  10 Units Subcutaneous TID WC   insulin  glargine  30 Units Subcutaneous Daily   multivitamin with minerals  1 tablet Oral Daily   mupirocin  ointment  1 Application Nasal BID   oxyCODONE   5 mg Oral Q4H   pantoprazole   40 mg Oral Q0600   sertraline   50 mg Oral Daily   Continuous Infusions:   LOS: 3 days   Time spent: 52 mins  Rakeen Gaillard Vicci, MD How to contact the [facility] Attending or Consulting provider 7A - 7P or covering provider during after hours 7P -7A, for this patient?  Check the care team in [facility] and look for a) attending/consulting [facility] provider listed and b) the [facility] team listed Log into [email or web address] to find provider on call.  Locate the [facility] provider you are looking for under [facility] and page to a number that you can be directly reached. If you still have difficulty reaching the provider, please page the [facility] (Director on Call) for the Hospitalists listed on amion for assistance.  11/19/2023, 12:59 PM

## 2023-11-19 NOTE — Plan of Care (Signed)
   Problem: Education: Goal: Knowledge of General Education information will improve Description: Including pain rating scale, medication(s)/side effects and non-pharmacologic comfort measures Outcome: Progressing   Problem: Activity: Goal: Risk for activity intolerance will decrease Outcome: Progressing   Problem: Nutrition: Goal: Adequate nutrition will be maintained Outcome: Progressing

## 2023-11-19 NOTE — Evaluation (Signed)
 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  She has been unable to get up and walk due to the severe pain.  She ended up lying on the floor most of the night and soiled herself with stool.  Her son called to check-in on her and because she was unable to answer the phone he came over to check on her then called 911 when he found her on the floor.  Patient reportedly did not hit her head.  She mostly complained of left side and hip pain.  She was noted to have a blood glucose of 327.  In the ED she was evaluated normal chest x-ray, but her hip x-ray showed an acute left subcapital femoral neck fracture.  Orthopedics was consulted and Dr. Margrette planning to see patient and take to the OR.  Medicine is admitting for further management.   Clinical Impression  Patient demonstrates increased L hip pain, decreased LLE strength, and impaired balance. Patient also demonstrates mod/max assist for bed mobility and functional transfers, RW and gait belt for safety. Patient also demonstrates need for education on role of PT, POC, type of surgery performed and direct lateral hip precautions. Patient requires verbal encouragement to move LLE throughout session. Patient would benefit from skilled acute physical therapy for increased endurance with ambulation, increased LLE strength, and balance for improved gait quality, return to higher level of function with ADLs, and progress towards therapy goals.         If plan is discharge home, recommend the  following: Two people to help with walking and/or transfers;Two people to help with bathing/dressing/bathroom;Assistance with cooking/housework;Assistance with feeding;Direct supervision/assist for medications management;Assist for transportation;Help with stairs or ramp for entrance   Can travel by private vehicle        Equipment Recommendations None recommended by PT (BSC if pt goes home immediatley)  Recommendations for Other Services       Functional Status Assessment Patient has had a recent decline in their functional status and demonstrates the ability to make significant improvements in function in a reasonable and predictable amount of time.     Precautions / Restrictions Precautions Precautions: Other (comment) (Lateral hip) Recall of Precautions/Restrictions: Intact Restrictions Weight Bearing Restrictions Per Provider Order: Yes (WBAT) LLE Weight Bearing Per Provider Order: Weight bearing as tolerated      Mobility  Bed Mobility Overal bed mobility: Needs Assistance Bed Mobility: Supine to Sit     Supine to sit: Mod assist, Max assist     General bed mobility comments: Pt hesistant to initial anterior lean and demonstrates core/UE/LE weakness    Transfers Overall transfer level: Needs assistance Equipment used: Rolling walker (2 wheels), 1 person hand held assist Transfers: Sit to/from Stand, Bed to chair/wheelchair/BSC Sit to Stand: Max assist   Step pivot transfers: Mod assist, Max assist       General transfer comment: pt demonstrates labored movement and SOB    Ambulation/Gait               General Gait Details: unable to support with UE due to decreased WB on  LLE  Stairs            Wheelchair Mobility     Tilt Bed    Modified Rankin (Stroke Patients Only)       Balance Overall balance assessment: Needs assistance Sitting-balance support: Bilateral upper extremity supported Sitting balance-Leahy Scale: Poor Sitting  balance - Comments: pt at first needs mod assist to sit up EOB but later is able to sit with bilateral UE support only Postural control: Posterior lean Standing balance support: Bilateral upper extremity supported, Reliant on assistive device for balance Standing balance-Leahy Scale: Poor Standing balance comment: mod assist required                             Pertinent Vitals/Pain Pain Assessment Pain Assessment: 0-10 Pain Score: 9  (following bed and functional mobility)    Home Living Family/patient expects to be discharged to:: Private residence Living Arrangements: Alone Available Help at Discharge: Other (Comment) (pt states she is unsure about living assistance available) Type of Home: House Home Access: Stairs to enter Entrance Stairs-Rails: Right Entrance Stairs-Number of Steps: 3 Alternate Level Stairs-Number of Steps: 12 Home Layout: Two level;Able to live on main level with bedroom/bathroom Home Equipment: Rolling Walker (2 wheels);Cane - single point      Prior Function Prior Level of Function : Independent/Modified Independent                     Extremity/Trunk Assessment   Upper Extremity Assessment Upper Extremity Assessment: Generalized weakness    Lower Extremity Assessment Lower Extremity Assessment: Generalized weakness;LLE deficits/detail LLE Deficits / Details: s/p bipolar hemiarthroplasty LLE: Unable to fully assess due to pain LLE Sensation: WNL LLE Coordination: decreased gross motor    Cervical / Trunk Assessment Cervical / Trunk Assessment: Kyphotic  Communication   Communication Communication: No apparent difficulties    Cognition Arousal: Alert, Obtunded Behavior During Therapy: WFL for tasks assessed/performed   PT - Cognitive impairments: No apparent impairments, Other                       PT - Cognition Comments: pt reports she is just having touble thinking, probably due to post op  grogginess Following commands: Intact       Cueing Cueing Techniques: Verbal cues, Tactile cues, Visual cues     General Comments      Exercises Total Joint Exercises Ankle Circles/Pumps: AROM, 15 reps   Assessment/Plan    PT Assessment Patient needs continued PT services  PT Problem List Decreased strength;Decreased mobility;Decreased knowledge of precautions;Decreased range of motion;Decreased activity tolerance;Decreased balance;Pain;Decreased knowledge of use of DME;Decreased coordination       PT Treatment Interventions DME instruction;Gait training;Stair training;Functional mobility training;Neuromuscular re-education;Balance training;Patient/family education;Therapeutic exercise;Therapeutic activities    PT Goals (Current goals can be found in the Care Plan section)  Acute Rehab PT Goals Patient Stated Goal: to regain strength before returning home PT Goal Formulation: With patient Time For Goal Achievement: 12/03/23 Potential to Achieve Goals: Good    Frequency Min 5X/week     Co-evaluation               AM-PAC PT 6 Clicks Mobility  Outcome Measure Help needed turning from your back to your side while in a flat bed without using bedrails?: A Lot Help needed moving from lying on your back to sitting on the side of a flat bed without using bedrails?: A  Lot Help needed moving to and from a bed to a chair (including a wheelchair)?: A Lot Help needed standing up from a chair using your arms (e.g., wheelchair or bedside chair)?: A Lot Help needed to walk in hospital room?: A Lot Help needed climbing 3-5 steps with a railing? : Total 6 Click Score: 11    End of Session Equipment Utilized During Treatment: Gait belt Activity Tolerance: Patient limited by fatigue;Patient limited by pain Patient left: in bed;with nursing/sitter in room Nurse Communication: Mobility status;Patient requests pain meds;Precautions PT Visit Diagnosis: Unsteadiness on feet  (R26.81);Other abnormalities of gait and mobility (R26.89);Muscle weakness (generalized) (M62.81);History of falling (Z91.81);Difficulty in walking, not elsewhere classified (R26.2)    Time:  -      Charges:   PT Evaluation $PT Eval Low Complexity: 1 Low PT Treatments $Therapeutic Activity: 23-37 mins PT General Charges $$ ACUTE PT VISIT: 1 Visit         Lang Ada, PT, DPT [facility] Office: [phone] 9:20 AM, 11/19/23

## 2023-11-20 DIAGNOSIS — S72002A Fracture of unspecified part of neck of left femur, initial encounter for closed fracture: Secondary | ICD-10-CM | POA: Diagnosis not present

## 2023-11-20 DIAGNOSIS — R748 Abnormal levels of other serum enzymes: Secondary | ICD-10-CM | POA: Diagnosis not present

## 2023-11-20 DIAGNOSIS — E109 Type 1 diabetes mellitus without complications: Secondary | ICD-10-CM | POA: Diagnosis not present

## 2023-11-20 DIAGNOSIS — M25552 Pain in left hip: Secondary | ICD-10-CM | POA: Diagnosis not present

## 2023-11-20 LAB — BASIC METABOLIC PANEL WITH GFR
Anion gap: 9 (ref 5–15)
BUN: 16 mg/dL (ref 8–23)
CO2: 28 mmol/L (ref 22–32)
Calcium: 8.3 mg/dL — ABNORMAL LOW (ref 8.9–10.3)
Chloride: 95 mmol/L — ABNORMAL LOW (ref 98–111)
Creatinine, Ser: 0.72 mg/dL (ref 0.44–1.00)
GFR, Estimated: >60 mL/min (ref >60)
Glucose, Bld: 302 mg/dL — ABNORMAL HIGH (ref 70–99)
Potassium: 4 mmol/L (ref 3.5–5.1)
Sodium: 132 mmol/L — ABNORMAL LOW (ref 135–145)

## 2023-11-20 LAB — CBC
HCT: 30.5 % — ABNORMAL LOW (ref 36.0–46.0)
Hemoglobin: 10.3 g/dL — ABNORMAL LOW (ref 12.0–15.0)
MCH: 31 pg (ref 26.0–34.0)
MCHC: 33.8 g/dL (ref 30.0–36.0)
MCV: 91.9 fL (ref 80.0–100.0)
Platelets: 180 K/uL (ref 150–400)
RBC: 3.32 MIL/uL — ABNORMAL LOW (ref 3.87–5.11)
RDW: 13.2 % (ref 11.5–15.5)
WBC: 6.6 K/uL (ref 4.0–10.5)
nRBC: 0 % (ref 0.0–0.2)

## 2023-11-20 LAB — GLUCOSE, CAPILLARY
Glucose-Capillary: 250 mg/dL — ABNORMAL HIGH (ref 70–99)
Glucose-Capillary: 296 mg/dL — ABNORMAL HIGH (ref 70–99)
Glucose-Capillary: 284 mg/dL — ABNORMAL HIGH (ref 70–99)
Glucose-Capillary: 117 mg/dL — ABNORMAL HIGH (ref 70–99)
Glucose-Capillary: 49 mg/dL — ABNORMAL LOW (ref 70–99)
Glucose-Capillary: 97 mg/dL (ref 70–99)

## 2023-11-20 MED ORDER — INSULIN GLARGINE 100 UNIT/ML ~~LOC~~ SOLN
40.0000 [IU] | Freq: Every day | SUBCUTANEOUS | Status: DC
  Administered 2023-11-20 – 2023-11-21 (×2): 40 [IU] via SUBCUTANEOUS
  Filled 2023-11-20 (×3): qty 0.4

## 2023-11-20 MED ORDER — INSULIN ASPART 100 UNIT/ML IJ SOLN
12.0000 [IU] | Freq: Three times a day (TID) | INTRAMUSCULAR | Status: DC
  Administered 2023-11-20 – 2023-11-21 (×6): 12 [IU] via SUBCUTANEOUS

## 2023-11-20 NOTE — TOC CM/SW Note (Signed)
 Per Garrel at [facility] they can offer a bed. Unable to reach family to officially accept. Auth still pending.

## 2023-11-20 NOTE — Progress Notes (Signed)
 Patient ID: Audrey [name], female   DOB: 1945/10/09, 78 y.o.   MRN: [id number]  Status post left bipolar hip replacement for left hip fracture  Patient more awake today.  Leg looks normal in terms of alignment, dressing clean dry and intact  Neurovascular exam normal patient awake and alert  Rehab when available  Weight-bear as tolerated Direct lateral hip precautions Follow-up in 4 weeks for x-ray

## 2023-11-20 NOTE — Progress Notes (Signed)
 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  She has been unable to get up and walk due to the severe pain.  She ended up lying on the floor most of the night and soiled herself with stool.  Her son called to check-in on her and because she was unable to answer the phone he came over to check on her then called 911 when he found her on the floor.  Patient reportedly did not hit her head.  She mostly complained of left side and hip pain.  She was noted to have a blood glucose of 327.  In the ED she was evaluated normal chest x-ray, but her hip x-ray showed an acute left subcapital femoral neck fracture.  Orthopedics was consulted and Dr. Margrette planning to see patient and take to the OR.  Medicine is admitting for further management.   Assessment and Plan:  Acute left subcapital femoral neck fracture -- admit to med surg -- ortho evaluation and taken to OR on 9/12 for partial left hip replacement/bipolar -- IV pain and nausea medication -- postop orders per ortho -- 25-OH vitamin D  is 68 reassuring -- fall precautions  -- supportive measures ordered -- PT eval with plan for SNF placement for acute rehab   Rhabdomyolysis - resolved -- IV fluid hydration ordered -- follow CK to improvement  -- CK down to 270 and stopped checking   Type 1 DM - Uncontrolled with hyperglycemia -- A1c - 10.2%  -- SSI coverage ordered -- frequent CBG monitoring -- serum ketones reassuring pt not in DKA --  increased semglee  to 40 units, increased prandial novolog  to 12 units TID, increased SSI coverage  CBG (last 3)  Recent Labs    11/20/23 0314 11/20/23 0714 11/20/23 1121  GLUCAP 250* 296* 284*   Essential hypertension -- IV hydralazine  ordered while NPO -- resumed home lisinopril     Hyperlipidemia -- held home pravastatin  due to elevated CK level on admission -- resumed pravastatin  now that CK levels are down  DVT prophylaxis: sq heparin  Code Status: Full  Family Communication:  Disposition: awaiting SNF placement     Consultants:  Orthopedics   Procedures:  ORIF left hip: partial left hip replacement/bipolar 9/12   Antimicrobials:    Subjective: Pt reports her pain is much improved and she is sitting up in a chair.    Objective: Vitals:   11/19/23 0500 11/19/23 1229 11/19/23 1945 11/20/23 0431  BP: (!) 151/68 (!) 131/51 (!) 141/58 (!) 148/74  Pulse: (!) 102 94 95 96  Resp: 18 17 17 18   Temp: 99.9 F (37.7 C) 98.6 F (37 C) 99.1 F (37.3 C) 98.2 F (36.8 C)  TempSrc: Oral Oral Oral   SpO2: 92% 95% 96% 96%  Weight:      Height:        Intake/Output Summary (Last 24 hours) at 11/20/2023 1517 Last data filed at 11/20/2023 1300 Gross per 24 hour  Intake 970 ml  Output 1000 ml  Net -30 ml   Filed  Weights   11/16/23 1033 11/16/23 1451 11/18/23 1228  Weight: 49 kg 50.5 kg 48.1 kg   Examination:  General exam: Appears calm and not in distress but not fully comfortable.  Respiratory system: Clear to auscultation. Respiratory effort normal. Cardiovascular system: normal S1 & S2 heard. No JVD, murmurs, rubs, gallops or clicks. No pedal edema. Gastrointestinal system: Abdomen is nondistended, soft and nontender. No organomegaly or masses felt. Normal bowel sounds heard. Central nervous system: Alert and oriented. No focal neurological deficits. Extremities: wounds clean and dry, warm feet and palpable pulses left foot. Skin: No rashes, lesions or  ulcers. Psychiatry: Judgement and insight appear normal. Mood & affect appropriate.   Data Reviewed: I have personally reviewed following labs and imaging studies  CBC: Recent Labs  Lab 11/16/23 1107 11/17/23 0440 11/18/23 0428 11/19/23 0452 11/20/23 0456  WBC 8.5 6.7 6.2 5.9 6.6  NEUTROABS 7.5  --   --   --   --   HGB 13.2 10.3* 10.7* 10.2* 10.3*  HCT 39.7 31.8* 32.1* 30.8* 30.5*  MCV 92.3 93.3 93.0 93.1 91.9  PLT 206 188 194 190 180    Basic Metabolic Panel: Recent Labs  Lab 11/16/23 1107 11/17/23 0440 11/19/23 0452 11/20/23 0456  NA 140 136 135 132*  K 4.0 3.7 4.0 4.0  CL 100 102 100 95*  CO2 26 23 26 28   GLUCOSE 247* 265* 226* 302*  BUN 13 14 15 16   CREATININE 0.73 0.67 0.72 0.72  CALCIUM 9.3 8.3* 8.2* 8.3*    CBG: Recent Labs  Lab 11/19/23 1603 11/19/23 2025 11/20/23 0314 11/20/23 0714 11/20/23 1121  GLUCAP 136* 232* 250* 296* 284*    Recent Results (from the past 240 hours)  Surgical PCR screen     Status: Abnormal   Collection Time: 11/18/23  4:23 AM   Specimen: Nasal Mucosa; Nasal Swab  Result Value Ref Range Status   MRSA, PCR NEGATIVE NEGATIVE Final   Staphylococcus aureus POSITIVE (A) NEGATIVE Final    Comment: RESULT CALLED TO, READ BACK BY AND VERIFIED WITH: (NOTE) The Xpert SA Assay (FDA approved for NASAL specimens in patients 34 years of age and older), is one component of a comprehensive surveillance program. It is not intended to diagnose infection nor to guide or monitor treatment. Performed at [facility], [address], [place], [place] [address]      Radiology Studies: DG HIP UNILAT WITH PELVIS 2-3 VIEWS LEFT Result Date: 11/18/2023 CLINICAL DATA:  Postoperative evaluation. EXAM: DG HIP (WITH OR WITHOUT PELVIS) 2-3V LEFT COMPARISON:  11/16/2023. FINDINGS: Status post left hip bipolar hemiarthroplasty with normal alignment. No acute fracture. Expected postoperative soft tissue swelling and soft tissue air along the left hip.  IMPRESSION: Status post left hip hemiarthroplasty with normal alignment. No acute complication. Electronically Signed   By: Harrietta Sherry M.D.   On: 11/18/2023 16:28    Scheduled Meds:  acetaminophen   500 mg Oral Once   cholecalciferol   5,000 Units Oral Daily   docusate sodium   100 mg Oral BID   enoxaparin  (LOVENOX ) injection  40 mg Subcutaneous Q24H   feeding supplement (GLUCERNA SHAKE)  237 mL Oral TID BM   insulin  aspart  0-15 Units Subcutaneous TID WC   insulin  aspart  0-5 Units Subcutaneous QHS   insulin  aspart  12 Units Subcutaneous TID WC   insulin  glargine  40 Units Subcutaneous Daily   multivitamin with minerals  1 tablet Oral Daily   mupirocin  ointment  1 Application Nasal BID  oxyCODONE   5 mg Oral Q4H   pantoprazole   40 mg Oral Q0600   pravastatin   80 mg Oral q1800   sertraline   50 mg Oral Daily   Continuous Infusions:   LOS: 4 days   Time spent: 50 mins  Yaritsa Savarino Vicci, MD How to contact the [facility] Attending or Consulting provider 7A - 7P or covering provider during after hours 7P -7A, for this patient?  Check the care team in [facility] and look for a) attending/consulting TR[facility] provider listed and b) the TRH team listed Log into [email or web address] to find provider on call.  Locate the TRH provider you are looking for under [facility] and page to a number that you can be directly reached. If you still have difficulty reaching the provider, please page the [facility] (Director on Call) for the Hospitalists listed on amion for assistance.  11/20/2023, 3:17 PM

## 2023-11-21 ENCOUNTER — Encounter (HOSPITAL_COMMUNITY): Payer: Self-pay | Admitting: Orthopedic Surgery

## 2023-11-21 DIAGNOSIS — M25552 Pain in left hip: Secondary | ICD-10-CM | POA: Diagnosis not present

## 2023-11-21 DIAGNOSIS — R278 Other lack of coordination: Secondary | ICD-10-CM | POA: Diagnosis not present

## 2023-11-21 DIAGNOSIS — Z96669 Presence of unspecified artificial ankle joint: Secondary | ICD-10-CM | POA: Diagnosis not present

## 2023-11-21 DIAGNOSIS — M6281 Muscle weakness (generalized): Secondary | ICD-10-CM | POA: Diagnosis not present

## 2023-11-21 DIAGNOSIS — T796XXA Traumatic ischemia of muscle, initial encounter: Secondary | ICD-10-CM | POA: Diagnosis not present

## 2023-11-21 DIAGNOSIS — Z471 Aftercare following joint replacement surgery: Secondary | ICD-10-CM | POA: Diagnosis not present

## 2023-11-21 DIAGNOSIS — K5903 Drug induced constipation: Secondary | ICD-10-CM | POA: Diagnosis not present

## 2023-11-21 DIAGNOSIS — R748 Abnormal levels of other serum enzymes: Secondary | ICD-10-CM | POA: Diagnosis not present

## 2023-11-21 DIAGNOSIS — G4700 Insomnia, unspecified: Secondary | ICD-10-CM | POA: Diagnosis not present

## 2023-11-21 DIAGNOSIS — K5900 Constipation, unspecified: Secondary | ICD-10-CM | POA: Diagnosis not present

## 2023-11-21 DIAGNOSIS — E785 Hyperlipidemia, unspecified: Secondary | ICD-10-CM | POA: Insufficient documentation

## 2023-11-21 DIAGNOSIS — F339 Major depressive disorder, recurrent, unspecified: Secondary | ICD-10-CM | POA: Insufficient documentation

## 2023-11-21 DIAGNOSIS — R2689 Other abnormalities of gait and mobility: Secondary | ICD-10-CM | POA: Diagnosis not present

## 2023-11-21 DIAGNOSIS — E109 Type 1 diabetes mellitus without complications: Secondary | ICD-10-CM | POA: Diagnosis not present

## 2023-11-21 DIAGNOSIS — R739 Hyperglycemia, unspecified: Secondary | ICD-10-CM

## 2023-11-21 DIAGNOSIS — S72002D Fracture of unspecified part of neck of left femur, subsequent encounter for closed fracture with routine healing: Secondary | ICD-10-CM | POA: Diagnosis not present

## 2023-11-21 DIAGNOSIS — L89622 Pressure ulcer of left heel, stage 2: Secondary | ICD-10-CM | POA: Diagnosis not present

## 2023-11-21 DIAGNOSIS — S72002A Fracture of unspecified part of neck of left femur, initial encounter for closed fracture: Secondary | ICD-10-CM | POA: Diagnosis not present

## 2023-11-21 DIAGNOSIS — M6289 Other specified disorders of muscle: Secondary | ICD-10-CM | POA: Diagnosis not present

## 2023-11-21 DIAGNOSIS — M8000XD Age-related osteoporosis with current pathological fracture, unspecified site, subsequent encounter for fracture with routine healing: Secondary | ICD-10-CM | POA: Diagnosis not present

## 2023-11-21 DIAGNOSIS — Z743 Need for continuous supervision: Secondary | ICD-10-CM | POA: Diagnosis not present

## 2023-11-21 DIAGNOSIS — I10 Essential (primary) hypertension: Secondary | ICD-10-CM | POA: Diagnosis not present

## 2023-11-21 DIAGNOSIS — S79911A Unspecified injury of right hip, initial encounter: Secondary | ICD-10-CM | POA: Diagnosis not present

## 2023-11-21 DIAGNOSIS — E440 Moderate protein-calorie malnutrition: Secondary | ICD-10-CM | POA: Diagnosis not present

## 2023-11-21 DIAGNOSIS — J302 Other seasonal allergic rhinitis: Secondary | ICD-10-CM | POA: Diagnosis not present

## 2023-11-21 LAB — CBC
HCT: 29.4 % — ABNORMAL LOW (ref 36.0–46.0)
Hemoglobin: 9.8 g/dL — ABNORMAL LOW (ref 12.0–15.0)
MCH: 30.7 pg (ref 26.0–34.0)
MCHC: 33.3 g/dL (ref 30.0–36.0)
MCV: 92.2 fL (ref 80.0–100.0)
Platelets: 217 K/uL (ref 150–400)
RBC: 3.19 MIL/uL — ABNORMAL LOW (ref 3.87–5.11)
RDW: 13.1 % (ref 11.5–15.5)
WBC: 7.2 K/uL (ref 4.0–10.5)
nRBC: 0 % (ref 0.0–0.2)

## 2023-11-21 LAB — BASIC METABOLIC PANEL WITH GFR
Anion gap: 11 (ref 5–15)
BUN: 22 mg/dL (ref 8–23)
CO2: 28 mmol/L (ref 22–32)
Calcium: 8.8 mg/dL — ABNORMAL LOW (ref 8.9–10.3)
Chloride: 96 mmol/L — ABNORMAL LOW (ref 98–111)
Creatinine, Ser: 0.67 mg/dL (ref 0.44–1.00)
GFR, Estimated: >60 mL/min (ref >60)
Glucose, Bld: 296 mg/dL — ABNORMAL HIGH (ref 70–99)
Potassium: 4.4 mmol/L (ref 3.5–5.1)
Sodium: 135 mmol/L (ref 135–145)

## 2023-11-21 LAB — GLUCOSE, CAPILLARY
Glucose-Capillary: 288 mg/dL — ABNORMAL HIGH (ref 70–99)
Glucose-Capillary: 316 mg/dL — ABNORMAL HIGH (ref 70–99)
Glucose-Capillary: 211 mg/dL — ABNORMAL HIGH (ref 70–99)
Glucose-Capillary: 67 mg/dL — ABNORMAL LOW (ref 70–99)
Glucose-Capillary: 135 mg/dL — ABNORMAL HIGH (ref 70–99)

## 2023-11-21 MED ORDER — ENOXAPARIN SODIUM 40 MG/0.4ML IJ SOSY: 40.0000 mg | PREFILLED_SYRINGE | INTRAMUSCULAR | Status: DC

## 2023-11-21 MED ORDER — ADULT MULTIVITAMIN W/MINERALS CH: 1.0000 | ORAL_TABLET | Freq: Every day | ORAL | Status: AC

## 2023-11-21 MED ORDER — NOVOLOG 70/30 FLEXPEN RELION (70-30) 100 UNIT/ML ~~LOC~~ SUPN: 20.0000 [IU] | PEN_INJECTOR | Freq: Two times a day (BID) | SUBCUTANEOUS | Status: DC

## 2023-11-21 MED ORDER — DOCUSATE SODIUM 100 MG PO CAPS: 100.0000 mg | ORAL_CAPSULE | Freq: Two times a day (BID) | ORAL | Status: AC

## 2023-11-21 MED ORDER — METHOCARBAMOL 500 MG PO TABS: 500.0000 mg | ORAL_TABLET | Freq: Three times a day (TID) | ORAL | Status: DC | PRN

## 2023-11-21 MED ORDER — OXYCODONE HCL 5 MG PO TABS: 5.0000 mg | ORAL_TABLET | Freq: Four times a day (QID) | ORAL | 0 refills | Status: DC | PRN

## 2023-11-21 MED ORDER — FLEET ENEMA RE ENEM
1.0000 | ENEMA | Freq: Once | RECTAL | Status: AC
Start: 2023-11-21 — End: 2023-11-21
  Administered 2023-11-21: 1 via RECTAL

## 2023-11-21 MED ORDER — MILK AND MOLASSES ENEMA
1.0000 | Freq: Once | RECTAL | Status: DC
Start: 2023-11-21 — End: 2023-11-21

## 2023-11-21 MED ORDER — ACETAMINOPHEN 325 MG PO TABS: 650.0000 mg | ORAL_TABLET | Freq: Four times a day (QID) | ORAL | Status: AC | PRN

## 2023-11-21 NOTE — TOC Transition Note (Signed)
 Transition of Care [facility]) - Discharge Note   Patient Details  Name: Audrey [name] MRN: [id number] Date of Birth: October 31, 1945  Transition of [facility] CM/SW Contact:  Sharlyne Stabs, RN Phone Number: 11/21/2023, 2:49 PM   Clinical Narrative:   Audrey [name] received. MD completed DC summary, CM updated her son. Damian provided a room number, RN calling report. TOC scheduled EMS and sent clinicals in the hub.   Final next level of care: Skilled Nursing Facility Barriers to Discharge: Barriers Resolved   Patient Goals and CMS Choice Patient states their goals for this hospitalization and ongoing recovery are:: agreeable to SNF CMS Medicare.gov Compare Post Acute Care list provided to:: Patient Choice offered to / list presented to : Patient [place] ownership interest in [facility].provided to:: Patient    Discharge Placement                Patient to be transferred to facility by: EMS Name of family member notified: Son Patient and family notified of of transfer: 11/21/23  Discharge Plan and Services Additional resources added to the After Visit Summary for   In-house Referral: Clinical Social Work Discharge Planning Services: CM Consult Post Acute Care Choice: Skilled Nursing Facility                Social Drivers of Health (SDOH) Interventions SDOH Screenings   Food Insecurity: No Food Insecurity (11/16/2023)  Housing: Low Risk  (11/16/2023)  Transportation Needs: No Transportation Needs (11/16/2023)  Utilities: Not At Risk (11/16/2023)  Depression (PHQ2-9): Low Risk  (10/13/2023)  Social Connections: Moderately Integrated (11/16/2023)  Tobacco Use: Low Risk  (11/18/2023)    Readmission Risk Interventions    11/16/2023    1:20 PM  Readmission Risk Prevention Plan  Medication Screening Complete  Transportation Screening Complete

## 2023-11-21 NOTE — Plan of Care (Signed)
  Problem: Clinical Measurements: Goal: Will remain free from infection Outcome: Progressing   Problem: Coping: Goal: Level of anxiety will decrease Outcome: Progressing   Problem: Elimination: Goal: Will not experience complications related to bowel motility Outcome: Progressing

## 2023-11-21 NOTE — Progress Notes (Signed)
 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  She has been unable to get up and walk due to the severe pain.  She ended up lying on the floor most of the night and soiled herself with stool.  Her son called to check-in on her and because she was unable to answer the phone he came over to check on her then called 911 when he found her on the floor.  Patient reportedly did not hit her head.  She mostly complained of left side and hip pain.  She was noted to have a blood glucose of 327.  In the ED she was evaluated normal chest x-ray, but her hip x-ray showed an acute left subcapital femoral neck fracture.  Orthopedics was consulted and Dr. Margrette planning to see patient and take to the OR.  Medicine is admitting for further management.    PT Comments  Patient exhibited slow and labored mobility throughout today's session. She required maxA with bed mobility and static stance for mobility and safety, respectively. She required significant multimodal cueing throughout these transfers for proper sequencing and safety. She was able to stand at EOB for 5 minutes prior to sitting and completing today's seated interventions. AA LAQ were completed with therapist assistance to facilitate quadriceps engagement to improve her ease with sit to stand transfers. She was returned to bed with call bell and bed alarm with the nurse notified upon the conclusion of treatment. Patient will benefit from continued skilled physical therapy in hospital and recommended venue  below to increase strength, balance, endurance for safe ADLs and gait.     If plan is discharge home, recommend the following: Two people to help with walking and/or transfers;Two people to help with bathing/dressing/bathroom;Assistance with cooking/housework;Assistance with feeding;Direct supervision/assist for medications management;Assist for transportation;Help with stairs or ramp for entrance   Can travel by private vehicle        Equipment Recommendations  None recommended by PT    Recommendations for Other Services       Precautions / Restrictions Precautions Precautions: Other (comment) (Lateral hip) Recall of Precautions/Restrictions: Intact Restrictions Weight Bearing Restrictions Per Provider Order: Yes LLE Weight Bearing Per Provider Order: Weight bearing as tolerated     Mobility  Bed Mobility Overal bed mobility: Needs Assistance Bed Mobility: Supine to Sit, Sit to Supine     Supine to sit: Max assist Sit to supine: Max assist        Transfers Overall transfer level: Needs assistance Equipment used: Rolling walker (2 wheels) Transfers: Sit to/from Stand Sit to Stand: Max assist   Step pivot transfers: Max assist       General transfer comment: pt demonstrated slow and labored movement.    Ambulation/Gait                   Stairs             Wheelchair Mobility     Tilt Bed    Modified Rankin (Stroke Patients Only)       Balance Overall balance assessment: Needs assistance Sitting-balance support: Bilateral upper  extremity supported, Feet supported Sitting balance-Leahy Scale: Poor Sitting balance - Comments: pt required min to mod A for static sitting on EOB Postural control: Posterior lean Standing balance support: Bilateral upper extremity supported, Reliant on assistive device for balance Standing balance-Leahy Scale: Poor Standing balance comment: maxA required                            Communication  Communication Communication: No apparent difficulties  Cognition Arousal: Alert Behavior During Therapy: Flat affect   PT - Cognitive impairments: No apparent impairments                       PT - Cognition Comments: Pt required cueing throughout treatment for appropriate next steps. Following commands: Intact      Cueing Cueing Techniques: Verbal cues, Tactile cues, Visual cues  Exercises Total Joint Exercises Ankle Circles/Pumps: AROM, Both, 15 reps, Seated Heel Slides: AAROM, Left, 10 reps, Seated Long Arc Quad: AROM, Both, 15 reps, Seated Bridges: Strengthening, Both, 5 reps, Supine    General Comments        Pertinent Vitals/Pain Pain Assessment Pain Assessment: 0-10 Pain Score: 8  Pain Descriptors / Indicators: Other (Comment) (pt did not provide pain descriptors) Pain Intervention(s): Limited activity within patient's tolerance, Monitored during session, Repositioned    Home Living                          Prior Function            PT Goals (current goals can now be found in the care plan section) Acute Rehab PT Goals Patient Stated Goal: to regain strength before returning home PT Goal Formulation: With patient Time For Goal Achievement: 12/03/23 Potential to Achieve Goals: Good Progress towards PT goals: Progressing toward goals    Frequency    Min 5X/week      PT Plan      Co-evaluation              AM-PAC PT 6 Clicks Mobility   Outcome Measure  Help needed turning from your back to your side while in a flat bed without using bedrails?: A Lot Help needed moving from lying on your back to sitting on the side of a flat bed without using bedrails?: A Lot Help needed moving to and from a bed to a chair (including a wheelchair)?: A Lot Help needed standing up from a chair using your arms (e.g., wheelchair or bedside chair)?: A Lot Help needed to walk in hospital room?: A Lot Help needed climbing 3-5 steps with a  railing? : Total 6 Click Score: 11    End of Session Equipment Utilized During Treatment: Gait belt Activity Tolerance: Patient limited by pain;Patient limited by fatigue Patient left: in bed;with call bell/phone within reach;with bed alarm set Nurse Communication: Mobility status PT Visit Diagnosis: Unsteadiness on feet (R26.81);Other abnormalities of gait and mobility (R26.89);Muscle weakness (generalized) (M62.81);History of falling (Z91.81);Difficulty in walking, not elsewhere classified (R26.2)     Time: 8944-8867 PT Time Calculation (min) (ACUTE ONLY): 37 min  Charges:    $Therapeutic Exercise: 8-22 mins $Therapeutic Activity: 8-22 mins PT General Charges $$ ACUTE PT VISIT: 1 Visit                     Lacinda Fass, PT, DPT  11/21/2023, 12:00 PM

## 2023-11-21 NOTE — Discharge Summary (Signed)
 Physician Discharge Summary  Audrey KERKMAN FMW:[id number] DOB: Jul 09, 1945 DOA: 11/16/2023  PCP: Zollie Lowers, MD Orthopedics: Dr. Margrette  Admit date: 11/16/2023 Discharge date: 11/21/2023  Admitted From:  Home  Disposition: SNF Rehab   Recommendations for Outpatient Follow-up:  Follow up with PCP in 1 weeks Please obtain BMP/CBC in 1 week Follow up with Dr. Margrette as scheduled  Please monitor blood sugars 5x per day  ORTHOPEDIC INSTRUCTIONS PER DR HARRISON Weightbearing as tolerated Direct lateral hip precautions DVT prophylaxis for 30 days Postop appointment with Dr. Margrette scheduled for 28 days  Discharge Condition: STABLE   CODE STATUS: FULL DIET:  carb modified    Brief Hospitalization Summary: Please see all hospital notes, images, labs for full details of the hospitalization. Admission provider HPI:  78 year old female with type 1 diabetes mellitus, osteopenia, hyperlipidemia, PONV, history of pericardial effusion status post subxiphoid pericardial window placement about 10 years ago, rectal polyps presented to the emergency department by EMS after reportedly falling off of her sofa last night onto the floor and injured the left hip.  She has been unable to get up and walk due to the severe pain.  She ended up lying on the floor most of the night and soiled herself with stool.  Her son called to check-in on her and because she was unable to answer the phone he came over to check on her then called 911 when he found her on the floor.  Patient reportedly did not hit her head.  She mostly complained of left side and hip pain.  She was noted to have a blood glucose of 327.  In the ED she was evaluated normal chest x-ray, but her hip x-ray showed an acute left subcapital femoral neck fracture.  Orthopedics was consulted and Dr. Margrette planning to see patient and take to the OR.  Medicine is admitting for further management.  Hospital Course by listed problems addressed  Acute  left subcapital femoral neck fracture -- admit to med surg -- ortho evaluation and taken to OR on 9/12 for partial left hip replacement/bipolar -- IV pain and nausea medication -- postop orders per ortho -- 25-OH vitamin D  is 68 reassuring -- fall precautions  -- supportive measures ordered -- PT eval with plan for SNF placement for acute rehab  ORTHOPEDIC INSTRUCTIONS PER DR HARRISON Weightbearing as tolerated Direct lateral hip precautions DVT prophylaxis for 30 days Postop appointment with Dr. Margrette scheduled for 28 days   Rhabdomyolysis - resolved -- IV fluid hydration ordered -- follow CK to improvement  -- CK down to 270 and stopped checking   Type 1 DM - Uncontrolled with hyperglycemia -- A1c - 10.2%  -- SSI coverage ordered -- frequent CBG monitoring -- serum ketones reassuring pt not in DKA -- resume home 70/30 insulin  at discharge -- titrate doses as needed for better glycemic control as she recovers from surgery  Essential hypertension -- IV hydralazine  ordered while NPO -- resumed home lisinopril     Hyperlipidemia -- held home pravastatin  due to elevated CK level on admission -- resumed pravastatin  now that CK levels are down   Discharge Diagnoses:  Principal Problem:   Left displaced femoral neck fracture (HCC) Active Problems:   Second degree AV block, Mobitz type I   Osteoporosis   Type 1 diabetes mellitus without complications (HCC)   Rhabdomyolysis   Elevated CK   Left hip pain   Hyperglycemia   Malnutrition of moderate degree   Discharge Instructions: Discharge Instructions  Amb Referral to Nutrition and Diabetic Education   Complete by: As directed    Schedule for [place] location      Allergies as of 11/21/2023       Reactions   Morphine And Codeine    Januvia [sitagliptin]         Medication List     STOP taking these medications    aspirin 81 MG tablet   meloxicam  15 MG tablet Commonly known as: MOBIC     Tresiba  FlexTouch 100 UNIT/ML FlexTouch Pen Generic drug: insulin  degludec   TURMERIC PO       TAKE these medications    acetaminophen  325 MG tablet Commonly known as: TYLENOL  Take 2 tablets (650 mg total) by mouth every 6 (six) hours as needed for mild pain (pain score 1-3), moderate pain (pain score 4-6), fever or headache.   ascorbic acid 500 MG tablet Commonly known as: VITAMIN C Take 500 mg by mouth daily.   Calcium Carb-Cholecalciferol  600-800 MG-UNIT Tabs Take by mouth.   chlorhexidine  4 % external liquid Commonly known as: HIBICLENS  Apply 15 mLs (1 Application total) topically as directed for 30 doses. Use as directed daily for 5 days every other week for 6 weeks.   docusate sodium  100 MG capsule Commonly known as: COLACE Take 1 capsule (100 mg total) by mouth 2 (two) times daily.   enoxaparin  40 MG/0.4ML injection Commonly known as: LOVENOX  Inject 0.4 mLs (40 mg total) into the skin daily for 24 days. Start taking on: November 22, 2023   FreeStyle Libre 3 Plus Sensor Misc Change every 14 days   Insulin  Pen Needle 32G X 4 MM Misc Use 4x a day   lisinopril  5 MG tablet Commonly known as: ZESTRIL  Take 1 tablet (5 mg total) by mouth every other day.   Magnesium 250 MG Tabs Take 250 mg by mouth daily.   Melatonin 10 MG Tabs Take 10 mg by mouth at bedtime.   methocarbamol  500 MG tablet Commonly known as: ROBAXIN  Take 1 tablet (500 mg total) by mouth every 8 (eight) hours as needed for muscle spasms.   multivitamin with minerals Tabs tablet Take 1 tablet by mouth daily. Start taking on: November 22, 2023   NovoLOG  70/30 FlexPen (70-30) 100 UNIT/ML FlexPen Generic drug: insulin  aspart protamine - aspart Inject 20 Units into the skin 2 (two) times daily with a meal. What changed:  how much to take when to take this   onetouch ultrasoft lancets 1 each by Other route 4 (four) times daily. E11.9   oxyCODONE  5 MG immediate release tablet Commonly  known as: Oxy IR/ROXICODONE  Take 1 tablet (5 mg total) by mouth every 6 (six) hours as needed for severe pain (pain score 7-10).   pravastatin  80 MG tablet Commonly known as: PRAVACHOL  TAKE 1 TABLET BY MOUTH EVERY DAY   sertraline  50 MG tablet Commonly known as: ZOLOFT  TAKE 1 TABLET BY MOUTH EVERY DAY   Vitamin D -3 125 MCG (5000 UT) Tabs Take 125 mcg by mouth daily.        Contact information for follow-up providers     Margrette Taft BRAVO, MD. Schedule an appointment as soon as possible for a visit in 1 month(s).   Specialties: Orthopedic Surgery, Radiology Why: Hospital Follow Up Contact information: [address] [place] [address] [phone]              Contact information for after-discharge care     Destination     [facility] .  Service: Skilled Nursing Contact information: [address] [place]  [address] [phone]                    Allergies  Allergen Reactions   Morphine And Codeine    Januvia [Sitagliptin]    Allergies as of 11/21/2023       Reactions   Morphine And Codeine    Januvia [sitagliptin]         Medication List     STOP taking these medications    aspirin 81 MG tablet   meloxicam  15 MG tablet Commonly known as: MOBIC    Tresiba  FlexTouch 100 UNIT/ML FlexTouch Pen Generic drug: insulin  degludec   TURMERIC PO       TAKE these medications    acetaminophen  325 MG tablet Commonly known as: TYLENOL  Take 2 tablets (650 mg total) by mouth every 6 (six) hours as needed for mild pain (pain score 1-3), moderate pain (pain score 4-6), fever or headache.   ascorbic acid 500 MG tablet Commonly known as: VITAMIN C Take 500 mg by mouth daily.   Calcium Carb-Cholecalciferol  600-800 MG-UNIT Tabs Take by mouth.   chlorhexidine  4 % external liquid Commonly known as: HIBICLENS  Apply 15 mLs (1 Application total) topically as directed for 30 doses. Use as directed daily for 5  days every other week for 6 weeks.   docusate sodium  100 MG capsule Commonly known as: COLACE Take 1 capsule (100 mg total) by mouth 2 (two) times daily.   enoxaparin  40 MG/0.4ML injection Commonly known as: LOVENOX  Inject 0.4 mLs (40 mg total) into the skin daily for 24 days. Start taking on: November 22, 2023   FreeStyle Libre 3 Plus Sensor Misc Change every 14 days   Insulin  Pen Needle 32G X 4 MM Misc Use 4x a day   lisinopril  5 MG tablet Commonly known as: ZESTRIL  Take 1 tablet (5 mg total) by mouth every other day.   Magnesium 250 MG Tabs Take 250 mg by mouth daily.   Melatonin 10 MG Tabs Take 10 mg by mouth at bedtime.   methocarbamol  500 MG tablet Commonly known as: ROBAXIN  Take 1 tablet (500 mg total) by mouth every 8 (eight) hours as needed for muscle spasms.   multivitamin with minerals Tabs tablet Take 1 tablet by mouth daily. Start taking on: November 22, 2023   NovoLOG  70/30 FlexPen (70-30) 100 UNIT/ML FlexPen Generic drug: insulin  aspart protamine - aspart Inject 20 Units into the skin 2 (two) times daily with a meal. What changed:  how much to take when to take this   onetouch ultrasoft lancets 1 each by Other route 4 (four) times daily. E11.9   oxyCODONE  5 MG immediate release tablet Commonly known as: Oxy IR/ROXICODONE  Take 1 tablet (5 mg total) by mouth every 6 (six) hours as needed for severe pain (pain score 7-10).   pravastatin  80 MG tablet Commonly known as: PRAVACHOL  TAKE 1 TABLET BY MOUTH EVERY DAY   sertraline  50 MG tablet Commonly known as: ZOLOFT  TAKE 1 TABLET BY MOUTH EVERY DAY   Vitamin D -3 125 MCG (5000 UT) Tabs Take 125 mcg by mouth daily.        Procedures/Studies: DG HIP UNILAT WITH PELVIS 2-3 VIEWS LEFT Result Date: 11/18/2023 CLINICAL DATA:  Postoperative evaluation. EXAM: DG HIP (WITH OR WITHOUT PELVIS) 2-3V LEFT COMPARISON:  11/16/2023. FINDINGS: Status post left hip bipolar hemiarthroplasty with normal  alignment. No acute fracture. Expected postoperative soft tissue swelling and soft tissue  air along the left hip. IMPRESSION: Status post left hip hemiarthroplasty with normal alignment. No acute complication. Electronically Signed   By: Harrietta Sherry M.D.   On: 11/18/2023 16:28   DG Chest Portable 1 View Result Date: 11/16/2023 EXAM: 1 VIEW XRAY OF THE CHEST 11/16/2023 11:19:00 AM COMPARISON: 10/07/2022 CLINICAL HISTORY: FALL. Per triage: Pt arrived via [facility] for a fall that happened sometime after dinner last night. Cbg in route was 327. EMS administered a 500 bolus. Patient does have noticeable left and right leg length difference. FINDINGS: LUNGS AND PLEURA: No focal pulmonary opacity. No pulmonary edema. No pleural effusion. No pneumothorax. HEART AND MEDIASTINUM: No acute abnormality of the cardiac and mediastinal silhouettes. Aortic atherosclerosis. BONES AND SOFT TISSUES: No acute osseous abnormality. Degenerative changes of spine. IMPRESSION: 1. No acute process. Electronically signed by: Waddell Calk MD 11/16/2023 11:38 AM EDT RP Workstation: [id number]   DG Hip Unilat W or Wo Pelvis 2-3 Views Left Result Date: 11/16/2023 EXAM: 2 or more VIEW(S) XRAY OF THE UNILATERAL HIP 11/16/2023 11:19:00 AM COMPARISON: None available. CLINICAL HISTORY: L hip pain sp fall. Per triage: Pt arrived via [facility] for a fall that happened sometime after dinner last night. Cbg in route was 327. EMS administered a 500 bolus. Patient does have noticeable left and right leg length difference. FINDINGS: BONES AND JOINTS: Acute subcapital left femoral neck fracture. Proximal migration of distal femur. SOFT TISSUES: The soft tissues are unremarkable. IMPRESSION: 1. Acute subcapital left femoral neck fracture with proximal migration of the distal femur. Electronically signed by: Waddell Calk MD 11/16/2023 11:37 AM EDT RP Workstation: [id number]     Subjective: Pt reports pain controlled, she is eating and drinking well,  agreeable to acute rehab SNF   Discharge Exam: Vitals:   11/20/23 1951 11/21/23 0308  BP: (!) 143/62 (!) 153/85  Pulse: (!) 106 (!) 102  Resp: 18 18  Temp: 99.8 F (37.7 C) 99.3 F (37.4 C)  SpO2: 94% 95%   Vitals:   11/20/23 0431 11/20/23 1606 11/20/23 1951 11/21/23 0308  BP: (!) 148/74 (!) 124/47 (!) 143/62 (!) 153/85  Pulse: 96 (!) 101 (!) 106 (!) 102  Resp: 18 18 18 18   Temp: 98.2 F (36.8 C) 98.9 F (37.2 C) 99.8 F (37.7 C) 99.3 F (37.4 C)  TempSrc:  Oral Oral Oral  SpO2: 96% 94% 94% 95%  Weight:      Height:       General exam: Appears calm and not in distress but not fully comfortable.  Respiratory system: Clear to auscultation. Respiratory effort normal. Cardiovascular system: normal S1 & S2 heard. No JVD, murmurs, rubs, gallops or clicks. No pedal edema. Gastrointestinal system: Abdomen is nondistended, soft and nontender. No organomegaly or masses felt. Normal bowel sounds heard. Central nervous system: Alert and oriented. No focal neurological deficits. Extremities: wounds clean and dry, warm feet and palpable pulses left foot. Skin: No rashes, lesions or ulcers. Psychiatry: Judgement and insight appear normal. Mood & affect appropriate.    The results of significant diagnostics from this hospitalization (including imaging, microbiology, ancillary and laboratory) are listed below for reference.     Microbiology: Recent Results (from the past 240 hours)  Surgical PCR screen     Status: Abnormal   Collection Time: 11/18/23  4:23 AM   Specimen: Nasal Mucosa; Nasal Swab  Result Value Ref Range Status   MRSA, PCR NEGATIVE NEGATIVE Final   Staphylococcus aureus POSITIVE (A) NEGATIVE Final    Comment: RESULT CALLED TO,  READ BACK BY AND VERIFIED WITH: (NOTE) The Xpert SA Assay (FDA approved for NASAL specimens in patients 72 years of age and older), is one component of a comprehensive surveillance program. It is not intended to diagnose infection nor to guide  or monitor treatment. Performed at [facility], [address], [place], [place] [address]      Labs: BNP (last 3 results) No results for input(s): BNP in the last 8760 hours. Basic Metabolic Panel: Recent Labs  Lab 11/16/23 1107 11/17/23 0440 11/19/23 0452 11/20/23 0456 11/21/23 0337  NA 140 136 135 132* 135  K 4.0 3.7 4.0 4.0 4.4  CL 100 102 100 95* 96*  CO2 26 23 26 28 28   GLUCOSE 247* 265* 226* 302* 296*  BUN 13 14 15 16 22   CREATININE 0.73 0.67 0.72 0.72 0.67  CALCIUM 9.3 8.3* 8.2* 8.3* 8.8*   Liver Function Tests: No results for input(s): AST, ALT, ALKPHOS, BILITOT, PROT, ALBUMIN in the last 168 hours. No results for input(s): LIPASE, AMYLASE in the last 168 hours. No results for input(s): AMMONIA in the last 168 hours. CBC: Recent Labs  Lab 11/16/23 1107 11/17/23 0440 11/18/23 0428 11/19/23 0452 11/20/23 0456 11/21/23 0337  WBC 8.5 6.7 6.2 5.9 6.6 7.2  NEUTROABS 7.5  --   --   --   --   --   HGB 13.2 10.3* 10.7* 10.2* 10.3* 9.8*  HCT 39.7 31.8* 32.1* 30.8* 30.5* 29.4*  MCV 92.3 93.3 93.0 93.1 91.9 92.2  PLT 206 188 194 190 180 217   Cardiac Enzymes: Recent Labs  Lab 11/16/23 1107 11/17/23 0440 11/18/23 0428  CKTOTAL 1,175* 528* 270*   BNP: Invalid input(s): POCBNP CBG: Recent Labs  Lab 11/20/23 1953 11/20/23 2105 11/21/23 0311 11/21/23 0745 11/21/23 1129  GLUCAP 49* 97 288* 316* 211*   D-Dimer No results for input(s): DDIMER in the last 72 hours. Hgb A1c No results for input(s): HGBA1C in the last 72 hours. Lipid Profile No results for input(s): CHOL, HDL, LDLCALC, TRIG, CHOLHDL, LDLDIRECT in the last 72 hours. Thyroid function studies No results for input(s): TSH, T4TOTAL, T3FREE, THYROIDAB in the last 72 hours.  Invalid input(s): FREET3 Anemia work up No results for input(s): VITAMINB12, FOLATE, FERRITIN, TIBC, IRON, RETICCTPCT in the last 72 hours. Urinalysis     Component Value Date/Time   COLORURINE YELLOW 10/07/2022 2329   APPEARANCEUR Clear 10/12/2022 0926   LABSPEC 1.011 10/07/2022 2329   PHURINE 6.0 10/07/2022 2329   GLUCOSEU Negative 10/12/2022 0926   HGBUR NEGATIVE 10/07/2022 2329   BILIRUBINUR Negative 10/12/2022 0926   KETONESUR 5 (A) 10/07/2022 2329   PROTEINUR Negative 10/12/2022 0926   PROTEINUR NEGATIVE 10/07/2022 2329   UROBILINOGEN 1.0 02/18/2012 0802   NITRITE Negative 10/12/2022 0926   NITRITE NEGATIVE 10/07/2022 2329   LEUKOCYTESUR Negative 10/12/2022 0926   LEUKOCYTESUR NEGATIVE 10/07/2022 2329   Sepsis Labs Recent Labs  Lab 11/18/23 0428 11/19/23 0452 11/20/23 0456 11/21/23 0337  WBC 6.2 5.9 6.6 7.2   Microbiology Recent Results (from the past 240 hours)  Surgical PCR screen     Status: Abnormal   Collection Time: 11/18/23  4:23 AM   Specimen: Nasal Mucosa; Nasal Swab  Result Value Ref Range Status   MRSA, PCR NEGATIVE NEGATIVE Final   Staphylococcus aureus POSITIVE (A) NEGATIVE Final    Comment: RESULT CALLED TO, READ BACK BY AND VERIFIED WITH: (NOTE) The Xpert SA Assay (FDA approved for NASAL specimens in patients 20 years of age and  older), is one component of a comprehensive surveillance program. It is not intended to diagnose infection nor to guide or monitor treatment. Performed at [facility], [address] [place], [place] [address]    Time coordinating discharge: 38 mins   SIGNED:  Afton Louder, MD  [facility] 11/21/2023, 2:19 PM How to contact the [facility] Attending or Consulting provider 7A - 7P or covering provider during after hours 7P -7A, for this patient?  Check the care team in [facility] and look for a) attending/consulting [facility] provider listed and b) the [facility] team listed Log into [email or web address] and use [place]'s universal password to access. If you do not have the password, please contact the hospital operator. Locate the [facility] provider you are looking for under [facility]  and page to a number that you can be directly reached. If you still have difficulty reaching the provider, please page the [facility] (Director on Call) for the Hospitalists listed on amion for assistance.

## 2023-11-21 NOTE — Progress Notes (Signed)
 Patient is being discharged. Patient ate 75% of her dinner tray and received both the sliding scale insulin  and her meal coverage insulin . Her blood sugar was initially 67 and with oral intake, prior to receiving her tray, her CBG came up to 135 making her eligible for both insulins. I notified the RN on the 300 hall at [facility] and rehab of the insulin  dosage as well.

## 2023-11-21 NOTE — TOC Progression Note (Signed)
 Transition of Care [facility]) - Progression Note    Patient Details  Name: Audrey [name] MRN: [id number] Date of Birth: [birth year]  Transition of [facility]) CM/SW Contact  Sharlyne Stabs, RN Phone Number: 11/21/2023, 11:11 AM  Clinical Narrative:   Patient did not have bed offer this morning, TOC expanded the search. [employer], Son accepted. TOC CMA added to Auth, TOC following for approval. Team updated.   Expected Discharge Plan: Skilled Nursing Facility Barriers to Discharge: Continued Medical Work up    Expected Discharge Plan and Services In-house Referral: Clinical Social Work Discharge Planning Services: CM Consult Post Acute Care Choice: Skilled Nursing Facility Living arrangements for the past 2 months: Single Family Home        Social Drivers of Health (SDOH) Interventions SDOH Screenings   Food Insecurity: No Food Insecurity (11/16/2023)  Housing: Low Risk  (11/16/2023)  Transportation Needs: No Transportation Needs (11/16/2023)  Utilities: Not At Risk (11/16/2023)  Depression (PHQ2-9): Low Risk  (10/13/2023)  Social Connections: Moderately Integrated (11/16/2023)  Tobacco Use: Low Risk  (11/18/2023)    Readmission Risk Interventions    11/16/2023    1:20 PM  Readmission Risk Prevention Plan  Medication Screening Complete  Transportation Screening Complete

## 2023-11-21 NOTE — Care Management Important Message (Signed)
 Important Message  Patient Details  Name: Audrey [name] MRN: [id number] Date of Birth: Apr 27, 1945   Important Message Given:  Yes - Medicare IM     Wana Mount L Agness Sibrian 11/21/2023, 3:30 PM

## 2023-11-21 NOTE — Progress Notes (Signed)
 Patient ID: Audrey [name], female   DOB: Nov 15, 1945, 78 y.o.   MRN: [id number] Postop left hip bipolar replacement for left hip fracture BP (!) 153/85 (BP Location: Right Arm)   Pulse (!) 102   Temp 99.3 F (37.4 C) (Oral)   Resp 18   Ht 5' 4 (1.626 m)   Wt 48.1 kg   SpO2 95%   BMI 18.19 kg/m  Arrange appointment for follow-up per last note in OR note  Patient stable

## 2023-11-22 DIAGNOSIS — S72002D Fracture of unspecified part of neck of left femur, subsequent encounter for closed fracture with routine healing: Secondary | ICD-10-CM | POA: Diagnosis not present

## 2023-11-22 DIAGNOSIS — R279 Unspecified lack of coordination: Secondary | ICD-10-CM | POA: Insufficient documentation

## 2023-11-22 DIAGNOSIS — M629 Disorder of muscle, unspecified: Secondary | ICD-10-CM | POA: Insufficient documentation

## 2023-11-24 DIAGNOSIS — Z09 Encounter for follow-up examination after completed treatment for conditions other than malignant neoplasm: Secondary | ICD-10-CM | POA: Insufficient documentation

## 2023-11-24 NOTE — Progress Notes (Deleted)
 Subjective:  Patient ID: Audrey [name], female    DOB: December 02, 1945, 78 y.o.   MRN: [id number]  Patient Care Team: Zollie Lowers, MD as PCP - General (Family Medicine) Jeffrie Oneil BROCKS, MD as Attending Physician (Cardiology) Obadiah Coy, MD as Attending Physician (Cardiothoracic Surgery)   Chief Complaint:  No chief complaint on file.   HPI: Audrey [name] is a 78 y.o. female presenting on 12/01/2023 for No chief complaint on file.   Discussed the use of AI scribe software for clinical note transcription with the patient, who gave verbal consent to proceed.  History of Present Illness  Seen at the ED 11/16/2023 and admitted and d/c had surgery 9/1/32025   Discharge on Lovenox  prophylaxis for 28 days  PMH PMH  type 1 diabetes mellitus, osteopenia, hyperlipidemia, PONV, history of pericardial effusion, rectal polyps  BG  327 at the ED  Follow up with PCP in 1 weeks Please obtain BMP/CBC in 1 week Follow up with Dr. Margrette as scheduled  Please monitor blood sugars 5x per day   ORTHOPEDIC INSTRUCTIONS PER DR HARRISON Weightbearing as tolerated Direct lateral hip precautions DVT prophylaxis for 30 days Postop appointment with Dr. Margrette scheduled for 28 days    DG Chest Portable 1 View (11/16/2023): Chest X-ray shows no acute cardiopulmonary findings. Lungs are clear without evidence of pneumonia, pulmonary edema, effusion, or pneumothorax. The heart and mediastinum are normal in appearance with incidental finding of aortic atherosclerosis. No acute osseous abnormality is identified, though degenerative changes of the spine are noted. Overall impression: no acute process.  DG Hip Unilat W or Wo Pelvis 2-3 Views Left (11/16/2023): Left hip X-ray demonstrates an acute subcapital fracture of the left femoral neck with proximal migration of the distal femur, consistent with a displaced fracture. No additional osseous or soft tissue abnormalities noted. Impression: acute  displaced left femoral neck fracture.  Relevant past medical, surgical, family, and social history reviewed and updated as indicated.  Allergies and medications reviewed and updated. Data reviewed: Chart in Epic.   Past Medical History:  Diagnosis Date   Diabetes mellitus (HCC)    Hyperlipidemia    Osteopenia    30-Nov-2000 T-Score -2   Pericardial effusion    PONV (postoperative nausea and vomiting)    Seasonal allergies     Past Surgical History:  Procedure Laterality Date   rectal polyps removed     right wrist fx     SUBXYPHOID PERICARDIAL WINDOW  02/18/2012   Procedure: SUBXYPHOID PERICARDIAL WINDOW;  Surgeon: Coy Fleeta Ochoa, MD;  Location: [facility] OR;  Service: Thoracic;  Laterality: N/A;  TEE   TOTAL HIP ARTHROPLASTY Left 11/18/2023   Procedure: LEFT BIPOLAR HIP;  Surgeon: Margrette Taft BRAVO, MD;  Location: [facility] ORS;  Service: Orthopedics;  Laterality: Left;    Social History   Socioeconomic History   Marital status: Widowed    Spouse name: died 01-Dec-2011   Number of children: 1   Years of education: Not on file   Highest education level: Not on file  Occupational History   Occupation: computer/ desk top  Tobacco Use   Smoking status: Never   Smokeless tobacco: Never  Vaping Use   Vaping status: Never Used  Substance and Sexual Activity   Alcohol use: No   Drug use: Never   Sexual activity: Not on file  Other Topics Concern   Not on file  Social History Narrative   Not on file   Social Drivers of Health  Financial Resource Strain: Not on file  Food Insecurity: No Food Insecurity (11/16/2023)   Hunger Vital Sign    Worried About Running Out of Food in the Last Year: Never true    Ran Out of Food in the Last Year: Never true  Transportation Needs: No Transportation Needs (11/16/2023)   PRAPARE - Administrator, Civil Service (Medical): No    Lack of Transportation (Non-Medical): No  Physical Activity: Not on file  Stress: Not on file  Social Connections:  Moderately Integrated (11/16/2023)   Social Connection and Isolation Panel    Frequency of Communication with Friends and Family: More than three times a week    Frequency of Social Gatherings with Friends and Family: Once a week    Attends Religious Services: More than 4 times per year    Active Member of [employer] or Organizations: Yes    Attends Banker Meetings: More than 4 times per year    Marital Status: Widowed  Intimate Partner Violence: Not At Risk (11/16/2023)   Humiliation, Afraid, Rape, and Kick questionnaire    Fear of Current or Ex-Partner: No    Emotionally Abused: No    Physically Abused: No    Sexually Abused: No    Outpatient Encounter Medications as of 11/28/2023  Medication Sig   acetaminophen  (TYLENOL ) 325 MG tablet Take 2 tablets (650 mg total) by mouth every 6 (six) hours as needed for mild pain (pain score 1-3), moderate pain (pain score 4-6), fever or headache.   Calcium Carb-Cholecalciferol  600-800 MG-UNIT TABS Take by mouth.   chlorhexidine  (HIBICLENS ) 4 % external liquid Apply 15 mLs (1 Application total) topically as directed for 30 doses. Use as directed daily for 5 days every other week for 6 weeks.   Cholecalciferol  (VITAMIN D -3) 125 MCG (5000 UT) TABS Take 125 mcg by mouth daily.   Continuous Glucose Sensor (FREESTYLE LIBRE 3 PLUS SENSOR) MISC Change every 14 days   docusate sodium  (COLACE) 100 MG capsule Take 1 capsule (100 mg total) by mouth 2 (two) times daily.   enoxaparin  (LOVENOX ) 40 MG/0.4ML injection Inject 0.4 mLs (40 mg total) into the skin daily for 24 days.   insulin  aspart protamine - aspart (NOVOLOG  70/30 FLEXPEN) (70-30) 100 UNIT/ML FlexPen Inject 20 Units into the skin 2 (two) times daily with a meal.   Insulin  Pen Needle 32G X 4 MM MISC Use 4x a day   Lancets (ONETOUCH ULTRASOFT) lancets 1 each by Other route 4 (four) times daily. E11.9   lisinopril  (ZESTRIL ) 5 MG tablet Take 1 tablet (5 mg total) by mouth every other day.    Magnesium 250 MG TABS Take 250 mg by mouth daily.   Melatonin 10 MG TABS Take 10 mg by mouth at bedtime.   methocarbamol  (ROBAXIN ) 500 MG tablet Take 1 tablet (500 mg total) by mouth every 8 (eight) hours as needed for muscle spasms.   Multiple Vitamin (MULTIVITAMIN WITH MINERALS) TABS tablet Take 1 tablet by mouth daily.   oxyCODONE  (OXY IR/ROXICODONE ) 5 MG immediate release tablet Take 1 tablet (5 mg total) by mouth every 6 (six) hours as needed for severe pain (pain score 7-10).   pravastatin  (PRAVACHOL ) 80 MG tablet TAKE 1 TABLET BY MOUTH EVERY DAY   sertraline  (ZOLOFT ) 50 MG tablet TAKE 1 TABLET BY MOUTH EVERY DAY   vitamin C (ASCORBIC ACID) 500 MG tablet Take 500 mg by mouth daily.   No facility-administered encounter medications on file as of 11/28/2023.  Allergies  Allergen Reactions   Morphine And Codeine    Januvia [Sitagliptin]     Pertinent ROS per HPI, otherwise unremarkable      Objective:  There were no vitals taken for this visit.   Wt Readings from Last 3 Encounters:  11/18/23 106 lb (48.1 kg)  10/13/23 108 lb 3.2 oz (49.1 kg)  09/01/23 109 lb (49.4 kg)    Physical Exam Physical Exam      Results for orders placed or performed during the hospital encounter of 11/16/23  POC CBG, ED   Collection Time: 11/16/23 11:05 AM  Result Value Ref Range   Glucose-Capillary 229 (H) 70 - 99 mg/dL  CBC with Differential   Collection Time: 11/16/23 11:07 AM  Result Value Ref Range   WBC 8.5 4.0 - 10.5 K/uL   RBC 4.30 3.87 - 5.11 MIL/uL   Hemoglobin 13.2 12.0 - 15.0 g/dL   HCT 60.2 63.9 - 53.9 %   MCV 92.3 80.0 - 100.0 fL   MCH 30.7 26.0 - 34.0 pg   MCHC 33.2 30.0 - 36.0 g/dL   RDW 86.6 88.4 - 84.4 %   Platelets 206 150 - 400 K/uL   nRBC 0.0 0.0 - 0.2 %   Neutrophils Relative % 88 %   Neutro Abs 7.5 1.7 - 7.7 K/uL   Lymphocytes Relative 5 %   Lymphs Abs 0.4 (L) 0.7 - 4.0 K/uL   Monocytes Relative 5 %   Monocytes Absolute 0.5 0.1 - 1.0 K/uL   Eosinophils  Relative 1 %   Eosinophils Absolute 0.1 0.0 - 0.5 K/uL   Basophils Relative 1 %   Basophils Absolute 0.0 0.0 - 0.1 K/uL   Immature Granulocytes 0 %   Abs Immature Granulocytes 0.03 0.00 - 0.07 K/uL  Basic metabolic panel   Collection Time: 11/16/23 11:07 AM  Result Value Ref Range   Sodium 140 135 - 145 mmol/L   Potassium 4.0 3.5 - 5.1 mmol/L   Chloride 100 98 - 111 mmol/L   CO2 26 22 - 32 mmol/L   Glucose, Bld 247 (H) 70 - 99 mg/dL   BUN 13 8 - 23 mg/dL   Creatinine, Ser 9.26 0.44 - 1.00 mg/dL   Calcium 9.3 8.9 - 89.6 mg/dL   GFR, Estimated >39 >39 mL/min   Anion gap 14 5 - 15  CK   Collection Time: 11/16/23 11:07 AM  Result Value Ref Range   Total CK 1,175 (H) 38 - 234 U/L  Blood gas, venous (at [facility] and [facility])   Collection Time: 11/16/23 11:07 AM  Result Value Ref Range   pH, Ven 7.47 (H) 7.25 - 7.43   pCO2, Ven 49 44 - 60 mmHg   pO2, Ven <31 (LL) 32 - 45 mmHg   Bicarbonate 35.7 (H) 20.0 - 28.0 mmol/L   Acid-Base Excess 10.4 (H) 0.0 - 2.0 mmol/L   O2 Saturation 18.4 %   Patient temperature 37.1    Collection site LEFT ANTECUBITAL    Drawn by 442   Beta-hydroxybutyric acid   Collection Time: 11/16/23 11:07 AM  Result Value Ref Range   Beta-Hydroxybutyric Acid 0.37 (H) 0.05 - 0.27 mmol/L  Hemoglobin A1c   Collection Time: 11/16/23 12:40 PM  Result Value Ref Range   Hgb A1c MFr Bld 10.2 (H) 4.8 - 5.6 %   Mean Plasma Glucose 246.04 mg/dL  VITAMIN D  25 Hydroxy (Vit-D Deficiency, Fractures)   Collection Time: 11/16/23 12:40 PM  Result Value Ref Range  Vit D, 25-Hydroxy 68.07 30 - 100 ng/mL  Type and screen [facility]   Collection Time: 11/16/23 12:40 PM  Result Value Ref Range   ABO/RH(D) A NEG    Antibody Screen NEG    Sample Expiration      11/19/2023,2359 Performed at [facility], [address] [place], [place] [address]   Glucose, capillary   Collection Time: 11/16/23  4:16 PM  Result Value Ref Range   Glucose-Capillary 203 (H) 70 - 99 mg/dL   Glucose, capillary   Collection Time: 11/16/23  8:18 PM  Result Value Ref Range   Glucose-Capillary 226 (H) 70 - 99 mg/dL  CBC   Collection Time: 11/17/23  4:40 AM  Result Value Ref Range   WBC 6.7 4.0 - 10.5 K/uL   RBC 3.41 (L) 3.87 - 5.11 MIL/uL   Hemoglobin 10.3 (L) 12.0 - 15.0 g/dL   HCT 68.1 (L) 63.9 - 53.9 %   MCV 93.3 80.0 - 100.0 fL   MCH 30.2 26.0 - 34.0 pg   MCHC 32.4 30.0 - 36.0 g/dL   RDW 86.4 88.4 - 84.4 %   Platelets 188 150 - 400 K/uL   nRBC 0.0 0.0 - 0.2 %  Basic metabolic panel   Collection Time: 11/17/23  4:40 AM  Result Value Ref Range   Sodium 136 135 - 145 mmol/L   Potassium 3.7 3.5 - 5.1 mmol/L   Chloride 102 98 - 111 mmol/L   CO2 23 22 - 32 mmol/L   Glucose, Bld 265 (H) 70 - 99 mg/dL   BUN 14 8 - 23 mg/dL   Creatinine, Ser 9.32 0.44 - 1.00 mg/dL   Calcium 8.3 (L) 8.9 - 10.3 mg/dL   GFR, Estimated >39 >39 mL/min   Anion gap 11 5 - 15  CK   Collection Time: 11/17/23  4:40 AM  Result Value Ref Range   Total CK 528 (H) 38 - 234 U/L  Glucose, capillary   Collection Time: 11/17/23  5:32 AM  Result Value Ref Range   Glucose-Capillary 261 (H) 70 - 99 mg/dL  Glucose, capillary   Collection Time: 11/17/23  7:20 AM  Result Value Ref Range   Glucose-Capillary 268 (H) 70 - 99 mg/dL  Glucose, capillary   Collection Time: 11/17/23 11:25 AM  Result Value Ref Range   Glucose-Capillary 457 (H) 70 - 99 mg/dL  Glucose, capillary   Collection Time: 11/17/23  3:56 PM  Result Value Ref Range   Glucose-Capillary 185 (H) 70 - 99 mg/dL  Glucose, capillary   Collection Time: 11/17/23  8:14 PM  Result Value Ref Range   Glucose-Capillary 82 70 - 99 mg/dL  Surgical PCR screen   Collection Time: 11/18/23  4:23 AM   Specimen: Nasal Mucosa; Nasal Swab  Result Value Ref Range   MRSA, PCR NEGATIVE NEGATIVE   Staphylococcus aureus POSITIVE (A) NEGATIVE  CK   Collection Time: 11/18/23  4:28 AM  Result Value Ref Range   Total CK 270 (H) 38 - 234 U/L  CBC    Collection Time: 11/18/23  4:28 AM  Result Value Ref Range   WBC 6.2 4.0 - 10.5 K/uL   RBC 3.45 (L) 3.87 - 5.11 MIL/uL   Hemoglobin 10.7 (L) 12.0 - 15.0 g/dL   HCT 67.8 (L) 63.9 - 53.9 %   MCV 93.0 80.0 - 100.0 fL   MCH 31.0 26.0 - 34.0 pg   MCHC 33.3 30.0 - 36.0 g/dL   RDW 86.6 88.4 -  15.5 %   Platelets 194 150 - 400 K/uL   nRBC 0.0 0.0 - 0.2 %  Glucose, capillary   Collection Time: 11/18/23  5:10 AM  Result Value Ref Range   Glucose-Capillary 293 (H) 70 - 99 mg/dL  Glucose, capillary   Collection Time: 11/18/23  7:16 AM  Result Value Ref Range   Glucose-Capillary 268 (H) 70 - 99 mg/dL  Glucose, capillary   Collection Time: 11/18/23 10:59 AM  Result Value Ref Range   Glucose-Capillary 113 (H) 70 - 99 mg/dL  Glucose, capillary   Collection Time: 11/18/23 12:24 PM  Result Value Ref Range   Glucose-Capillary 107 (H) 70 - 99 mg/dL  Glucose, capillary   Collection Time: 11/18/23  3:42 PM  Result Value Ref Range   Glucose-Capillary 119 (H) 70 - 99 mg/dL  Glucose, capillary   Collection Time: 11/18/23  4:37 PM  Result Value Ref Range   Glucose-Capillary 103 (H) 70 - 99 mg/dL  Glucose, capillary   Collection Time: 11/18/23  7:57 PM  Result Value Ref Range   Glucose-Capillary 251 (H) 70 - 99 mg/dL   Comment 1 Notify RN    Comment 2 Document in Chart   Glucose, capillary   Collection Time: 11/19/23  3:06 AM  Result Value Ref Range   Glucose-Capillary 247 (H) 70 - 99 mg/dL  CBC   Collection Time: 11/19/23  4:52 AM  Result Value Ref Range   WBC 5.9 4.0 - 10.5 K/uL   RBC 3.31 (L) 3.87 - 5.11 MIL/uL   Hemoglobin 10.2 (L) 12.0 - 15.0 g/dL   HCT 69.1 (L) 63.9 - 53.9 %   MCV 93.1 80.0 - 100.0 fL   MCH 30.8 26.0 - 34.0 pg   MCHC 33.1 30.0 - 36.0 g/dL   RDW 86.7 88.4 - 84.4 %   Platelets 190 150 - 400 K/uL   nRBC 0.0 0.0 - 0.2 %  Basic metabolic panel with GFR   Collection Time: 11/19/23  4:52 AM  Result Value Ref Range   Sodium 135 135 - 145 mmol/L   Potassium 4.0 3.5 -  5.1 mmol/L   Chloride 100 98 - 111 mmol/L   CO2 26 22 - 32 mmol/L   Glucose, Bld 226 (H) 70 - 99 mg/dL   BUN 15 8 - 23 mg/dL   Creatinine, Ser 9.27 0.44 - 1.00 mg/dL   Calcium 8.2 (L) 8.9 - 10.3 mg/dL   GFR, Estimated >39 >39 mL/min   Anion gap 9 5 - 15  Glucose, capillary   Collection Time: 11/19/23  7:06 AM  Result Value Ref Range   Glucose-Capillary 211 (H) 70 - 99 mg/dL  Glucose, capillary   Collection Time: 11/19/23 11:16 AM  Result Value Ref Range   Glucose-Capillary 171 (H) 70 - 99 mg/dL  Glucose, capillary   Collection Time: 11/19/23  4:03 PM  Result Value Ref Range   Glucose-Capillary 136 (H) 70 - 99 mg/dL  Glucose, capillary   Collection Time: 11/19/23  8:25 PM  Result Value Ref Range   Glucose-Capillary 232 (H) 70 - 99 mg/dL   Comment 1 Notify RN    Comment 2 Document in Chart   Glucose, capillary   Collection Time: 11/20/23  3:14 AM  Result Value Ref Range   Glucose-Capillary 250 (H) 70 - 99 mg/dL  CBC   Collection Time: 11/20/23  4:56 AM  Result Value Ref Range   WBC 6.6 4.0 - 10.5 K/uL   RBC 3.32 (L) 3.87 -  5.11 MIL/uL   Hemoglobin 10.3 (L) 12.0 - 15.0 g/dL   HCT 69.4 (L) 63.9 - 53.9 %   MCV 91.9 80.0 - 100.0 fL   MCH 31.0 26.0 - 34.0 pg   MCHC 33.8 30.0 - 36.0 g/dL   RDW 86.7 88.4 - 84.4 %   Platelets 180 150 - 400 K/uL   nRBC 0.0 0.0 - 0.2 %  Basic metabolic panel with GFR   Collection Time: 11/20/23  4:56 AM  Result Value Ref Range   Sodium 132 (L) 135 - 145 mmol/L   Potassium 4.0 3.5 - 5.1 mmol/L   Chloride 95 (L) 98 - 111 mmol/L   CO2 28 22 - 32 mmol/L   Glucose, Bld 302 (H) 70 - 99 mg/dL   BUN 16 8 - 23 mg/dL   Creatinine, Ser 9.27 0.44 - 1.00 mg/dL   Calcium 8.3 (L) 8.9 - 10.3 mg/dL   GFR, Estimated >39 >39 mL/min   Anion gap 9 5 - 15  Glucose, capillary   Collection Time: 11/20/23  7:14 AM  Result Value Ref Range   Glucose-Capillary 296 (H) 70 - 99 mg/dL  Glucose, capillary   Collection Time: 11/20/23 11:21 AM  Result Value Ref  Range   Glucose-Capillary 284 (H) 70 - 99 mg/dL  Glucose, capillary   Collection Time: 11/20/23  4:05 PM  Result Value Ref Range   Glucose-Capillary 117 (H) 70 - 99 mg/dL  Glucose, capillary   Collection Time: 11/20/23  7:53 PM  Result Value Ref Range   Glucose-Capillary 49 (L) 70 - 99 mg/dL  Glucose, capillary   Collection Time: 11/20/23  9:05 PM  Result Value Ref Range   Glucose-Capillary 97 70 - 99 mg/dL  Glucose, capillary   Collection Time: 11/21/23  3:11 AM  Result Value Ref Range   Glucose-Capillary 288 (H) 70 - 99 mg/dL  CBC   Collection Time: 11/21/23  3:37 AM  Result Value Ref Range   WBC 7.2 4.0 - 10.5 K/uL   RBC 3.19 (L) 3.87 - 5.11 MIL/uL   Hemoglobin 9.8 (L) 12.0 - 15.0 g/dL   HCT 70.5 (L) 63.9 - 53.9 %   MCV 92.2 80.0 - 100.0 fL   MCH 30.7 26.0 - 34.0 pg   MCHC 33.3 30.0 - 36.0 g/dL   RDW 86.8 88.4 - 84.4 %   Platelets 217 150 - 400 K/uL   nRBC 0.0 0.0 - 0.2 %  Basic metabolic panel with GFR   Collection Time: 11/21/23  3:37 AM  Result Value Ref Range   Sodium 135 135 - 145 mmol/L   Potassium 4.4 3.5 - 5.1 mmol/L   Chloride 96 (L) 98 - 111 mmol/L   CO2 28 22 - 32 mmol/L   Glucose, Bld 296 (H) 70 - 99 mg/dL   BUN 22 8 - 23 mg/dL   Creatinine, Ser 9.32 0.44 - 1.00 mg/dL   Calcium 8.8 (L) 8.9 - 10.3 mg/dL   GFR, Estimated >39 >39 mL/min   Anion gap 11 5 - 15  Glucose, capillary   Collection Time: 11/21/23  7:45 AM  Result Value Ref Range   Glucose-Capillary 316 (H) 70 - 99 mg/dL  Glucose, capillary   Collection Time: 11/21/23 11:29 AM  Result Value Ref Range   Glucose-Capillary 211 (H) 70 - 99 mg/dL  Glucose, capillary   Collection Time: 11/21/23  4:11 PM  Result Value Ref Range   Glucose-Capillary 67 (L) 70 - 99 mg/dL  Glucose, capillary  Collection Time: 11/21/23  5:02 PM  Result Value Ref Range   Glucose-Capillary 135 (H) 70 - 99 mg/dL       Pertinent labs & imaging results that were available during my care of the patient were reviewed  by me and considered in my medical decision making.  Assessment & Plan:  There are no diagnoses linked to this encounter.   Assessment and Plan Assessment & Plan       Continue all other maintenance medications.  Follow up plan: No follow-ups on file.   Continue healthy lifestyle choices, including diet (rich in fruits, vegetables, and lean proteins, and low in salt and simple carbohydrates) and exercise (at least 30 minutes of moderate physical activity daily).  Educational handout given for ***  The above assessment and management plan was discussed with the patient. The patient verbalized understanding of and has agreed to the management plan. Patient is aware to call the clinic if they develop any new symptoms or if symptoms persist or worsen. Patient is aware when to return to the clinic for a follow-up visit. Patient educated on when it is appropriate to go to the emergency department.  @SIGNATURE @

## 2023-11-28 ENCOUNTER — Ambulatory Visit: Admitting: Family Medicine

## 2023-11-28 ENCOUNTER — Other Ambulatory Visit: Payer: Self-pay | Admitting: Family Medicine

## 2023-11-28 ENCOUNTER — Inpatient Hospital Stay: Admitting: Nurse Practitioner

## 2023-11-28 ENCOUNTER — Telehealth: Payer: Self-pay | Admitting: Family Medicine

## 2023-11-28 DIAGNOSIS — R748 Abnormal levels of other serum enzymes: Secondary | ICD-10-CM

## 2023-11-28 DIAGNOSIS — Z09 Encounter for follow-up examination after completed treatment for conditions other than malignant neoplasm: Secondary | ICD-10-CM

## 2023-11-28 DIAGNOSIS — S72002A Fracture of unspecified part of neck of left femur, initial encounter for closed fracture: Secondary | ICD-10-CM

## 2023-11-28 DIAGNOSIS — R739 Hyperglycemia, unspecified: Secondary | ICD-10-CM

## 2023-11-28 DIAGNOSIS — T796XXD Traumatic ischemia of muscle, subsequent encounter: Secondary | ICD-10-CM

## 2023-11-28 NOTE — Telephone Encounter (Signed)
 Copied from CRM [id number]. Topic: Medical Record Request - Other >> Nov 28, 2023 12:36 PM Diannia H wrote: Reason for CRM: Patients son Carliss is requesting a letter from the provider stating the patient is in right sound and mind to turn over power of attorney to her son Carliss. He needs it for the lawyers office. Could you assist? Patients son callback number is [phone]. He is needing this done as soon as possible due to some other pressing issues he is trying to resolve.

## 2023-11-29 ENCOUNTER — Encounter: Payer: Self-pay | Admitting: Family Medicine

## 2023-11-29 ENCOUNTER — Encounter: Payer: Self-pay | Admitting: *Deleted

## 2023-11-29 NOTE — Telephone Encounter (Signed)
 I wrote the letter. It shoould print on the printer near my office.

## 2023-11-29 NOTE — Telephone Encounter (Signed)
 Notified son.  He will come by and pick it up

## 2023-11-30 DIAGNOSIS — L89622 Pressure ulcer of left heel, stage 2: Secondary | ICD-10-CM | POA: Diagnosis not present

## 2023-11-30 DIAGNOSIS — E109 Type 1 diabetes mellitus without complications: Secondary | ICD-10-CM | POA: Diagnosis not present

## 2023-11-30 DIAGNOSIS — M6281 Muscle weakness (generalized): Secondary | ICD-10-CM | POA: Diagnosis not present

## 2023-11-30 DIAGNOSIS — E440 Moderate protein-calorie malnutrition: Secondary | ICD-10-CM | POA: Diagnosis not present

## 2023-12-02 DIAGNOSIS — Z96669 Presence of unspecified artificial ankle joint: Secondary | ICD-10-CM | POA: Diagnosis not present

## 2023-12-02 DIAGNOSIS — E109 Type 1 diabetes mellitus without complications: Secondary | ICD-10-CM | POA: Diagnosis not present

## 2023-12-02 DIAGNOSIS — K5903 Drug induced constipation: Secondary | ICD-10-CM | POA: Diagnosis not present

## 2023-12-02 DIAGNOSIS — G4700 Insomnia, unspecified: Secondary | ICD-10-CM | POA: Diagnosis not present

## 2023-12-02 DIAGNOSIS — Z471 Aftercare following joint replacement surgery: Secondary | ICD-10-CM | POA: Diagnosis not present

## 2023-12-02 DIAGNOSIS — I10 Essential (primary) hypertension: Secondary | ICD-10-CM | POA: Diagnosis not present

## 2023-12-02 DIAGNOSIS — S72002D Fracture of unspecified part of neck of left femur, subsequent encounter for closed fracture with routine healing: Secondary | ICD-10-CM | POA: Diagnosis not present

## 2023-12-07 DIAGNOSIS — L89622 Pressure ulcer of left heel, stage 2: Secondary | ICD-10-CM | POA: Diagnosis not present

## 2023-12-07 DIAGNOSIS — E440 Moderate protein-calorie malnutrition: Secondary | ICD-10-CM | POA: Diagnosis not present

## 2023-12-07 DIAGNOSIS — M6281 Muscle weakness (generalized): Secondary | ICD-10-CM | POA: Diagnosis not present

## 2023-12-07 DIAGNOSIS — E109 Type 1 diabetes mellitus without complications: Secondary | ICD-10-CM | POA: Diagnosis not present

## 2023-12-09 DIAGNOSIS — Z471 Aftercare following joint replacement surgery: Secondary | ICD-10-CM | POA: Diagnosis not present

## 2023-12-09 DIAGNOSIS — S72002D Fracture of unspecified part of neck of left femur, subsequent encounter for closed fracture with routine healing: Secondary | ICD-10-CM | POA: Diagnosis not present

## 2023-12-09 DIAGNOSIS — K5903 Drug induced constipation: Secondary | ICD-10-CM | POA: Diagnosis not present

## 2023-12-09 DIAGNOSIS — E109 Type 1 diabetes mellitus without complications: Secondary | ICD-10-CM | POA: Diagnosis not present

## 2023-12-09 DIAGNOSIS — Z96669 Presence of unspecified artificial ankle joint: Secondary | ICD-10-CM | POA: Diagnosis not present

## 2023-12-09 DIAGNOSIS — G4700 Insomnia, unspecified: Secondary | ICD-10-CM | POA: Diagnosis not present

## 2023-12-09 DIAGNOSIS — I10 Essential (primary) hypertension: Secondary | ICD-10-CM | POA: Diagnosis not present

## 2023-12-12 DIAGNOSIS — S72002D Fracture of unspecified part of neck of left femur, subsequent encounter for closed fracture with routine healing: Secondary | ICD-10-CM | POA: Diagnosis not present

## 2023-12-12 DIAGNOSIS — I10 Essential (primary) hypertension: Secondary | ICD-10-CM | POA: Diagnosis not present

## 2023-12-12 DIAGNOSIS — Z96669 Presence of unspecified artificial ankle joint: Secondary | ICD-10-CM | POA: Diagnosis not present

## 2023-12-12 DIAGNOSIS — K5903 Drug induced constipation: Secondary | ICD-10-CM | POA: Diagnosis not present

## 2023-12-12 DIAGNOSIS — E109 Type 1 diabetes mellitus without complications: Secondary | ICD-10-CM | POA: Diagnosis not present

## 2023-12-12 DIAGNOSIS — Z471 Aftercare following joint replacement surgery: Secondary | ICD-10-CM | POA: Diagnosis not present

## 2023-12-12 DIAGNOSIS — G4700 Insomnia, unspecified: Secondary | ICD-10-CM | POA: Diagnosis not present

## 2023-12-13 ENCOUNTER — Ambulatory Visit: Admitting: Internal Medicine

## 2023-12-14 DIAGNOSIS — E109 Type 1 diabetes mellitus without complications: Secondary | ICD-10-CM | POA: Diagnosis not present

## 2023-12-14 DIAGNOSIS — E440 Moderate protein-calorie malnutrition: Secondary | ICD-10-CM | POA: Diagnosis not present

## 2023-12-14 DIAGNOSIS — M6281 Muscle weakness (generalized): Secondary | ICD-10-CM | POA: Diagnosis not present

## 2023-12-14 DIAGNOSIS — L89622 Pressure ulcer of left heel, stage 2: Secondary | ICD-10-CM | POA: Diagnosis not present

## 2023-12-16 ENCOUNTER — Ambulatory Visit (INDEPENDENT_AMBULATORY_CARE_PROVIDER_SITE_OTHER): Admitting: Orthopedic Surgery

## 2023-12-16 DIAGNOSIS — S72002D Fracture of unspecified part of neck of left femur, subsequent encounter for closed fracture with routine healing: Secondary | ICD-10-CM

## 2023-12-16 NOTE — Progress Notes (Signed)
  First postop visit 4 weeks after surgery  12/16/2023   Chief Complaint  Patient presents with   Pain    Post op left hip. DOS 11/18/23    Encounter Diagnosis  Name Primary?   Closed fracture of neck of left femur with routine healing, subsequent encounter bipolar surgery fixation on 11/18/23 Yes    What pharmacy do you use ? [facility] DOI/DOS/ Date: 11/18/23  Patient walked 130 feet but her family member says she is struggling walking.  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

## 2023-12-17 DIAGNOSIS — E1059 Type 1 diabetes mellitus with other circulatory complications: Secondary | ICD-10-CM | POA: Diagnosis not present

## 2023-12-17 DIAGNOSIS — M2041 Other hammer toe(s) (acquired), right foot: Secondary | ICD-10-CM | POA: Diagnosis not present

## 2023-12-17 DIAGNOSIS — M2042 Other hammer toe(s) (acquired), left foot: Secondary | ICD-10-CM | POA: Diagnosis not present

## 2023-12-17 DIAGNOSIS — B351 Tinea unguium: Secondary | ICD-10-CM | POA: Diagnosis not present

## 2023-12-21 DIAGNOSIS — L89622 Pressure ulcer of left heel, stage 2: Secondary | ICD-10-CM | POA: Diagnosis not present

## 2023-12-21 DIAGNOSIS — E109 Type 1 diabetes mellitus without complications: Secondary | ICD-10-CM | POA: Diagnosis not present

## 2023-12-21 DIAGNOSIS — M6281 Muscle weakness (generalized): Secondary | ICD-10-CM | POA: Diagnosis not present

## 2023-12-21 DIAGNOSIS — E440 Moderate protein-calorie malnutrition: Secondary | ICD-10-CM | POA: Diagnosis not present

## 2023-12-28 DIAGNOSIS — E109 Type 1 diabetes mellitus without complications: Secondary | ICD-10-CM | POA: Diagnosis not present

## 2023-12-28 DIAGNOSIS — E440 Moderate protein-calorie malnutrition: Secondary | ICD-10-CM | POA: Diagnosis not present

## 2023-12-28 DIAGNOSIS — L89622 Pressure ulcer of left heel, stage 2: Secondary | ICD-10-CM | POA: Diagnosis not present

## 2023-12-28 DIAGNOSIS — M6281 Muscle weakness (generalized): Secondary | ICD-10-CM | POA: Diagnosis not present

## 2024-01-02 ENCOUNTER — Encounter: Payer: Self-pay | Admitting: Family Medicine

## 2024-01-03 ENCOUNTER — Telehealth: Payer: Self-pay | Admitting: Family Medicine

## 2024-01-03 NOTE — Telephone Encounter (Unsigned)
 Copied from CRM [id number]. Topic: General - Other >> Jan 03, 2024  8:30 AM Ahlexyia S wrote: Reason for CRM: Audrey [name] called in to provide the correct fax number to [facility] in [facility]. Audrey [name] dropped off FL2 paperwork Monday morning for Audrey and just wanted to make sure clinic has the correct info.  [address] [place], [place], [place] [address] Phone Number [phone] Fax Number: [phone]

## 2024-01-05 NOTE — Telephone Encounter (Signed)
 Responded to in MyChart mssg that this has been faxed.

## 2024-01-12 ENCOUNTER — Telehealth: Payer: Self-pay

## 2024-01-12 NOTE — Telephone Encounter (Signed)
 Called Audrey and gave her verbal approval.    Copied from CRM [id number]. Topic: Clinical - Home Health Verbal Orders >> Jan 12, 2024  4:48 PM Mercer PEDLAR wrote: Caller/Agency:  Audrey [name] [facility] Callback Number: [phone] (secure line) Service Requested: Occupational Therapy Frequency: 1 week 1, 2 week 2, 1 week 2 Any new concerns about the patient? No

## 2024-01-15 NOTE — ED Provider Notes (Signed)
 Emergency Department Provider Note    ED Clinical Impression   Final diagnoses:  Hyperglycemia due to diabetes mellitus (CMS-HCC) (Primary)    ED Assessment/Plan    History   Chief Complaint  Patient presents with  . Elevated Blood Glucose Symptomatic   The patient was brought from [facility] because of elevated glucose.  She was seen yesterday for similar problem.  By the patient's records she receives insulin  subcutaneous 20 units twice daily.  The patient has no specific complaints    Past Medical History[1]  Past Surgical History[2]  Family History[3]  Social History[4]  Review of Systems  Constitutional:  Negative for fever.  Respiratory:  Negative for shortness of breath.   Gastrointestinal:  Negative for vomiting.  Neurological:  Negative for dizziness and syncope.    Physical Exam   BP 149/75   Pulse 89   Temp 36.7 C (98 F) (Oral)   Resp 16   Ht 162.6 cm (5' 4)   Wt 54.2 kg (119 lb 8 oz)   SpO2 99%   BMI 20.51 kg/m   Physical Exam Vitals and nursing note reviewed.  Constitutional:      Appearance: Normal appearance.  HENT:     Head: Normocephalic and atraumatic.     Mouth/Throat:     Mouth: Mucous membranes are moist.  Cardiovascular:     Rate and Rhythm: Normal rate and regular rhythm.  Pulmonary:     Effort: Pulmonary effort is normal.  Musculoskeletal:        General: Normal range of motion.  Skin:    General: Skin is warm and dry.  Neurological:     General: No focal deficit present.     Mental Status: She is alert and oriented to person, place, and time.     ED Course   ED Course as of 01/15/24 1447  Sun Jan 15, 2024  1433 The patient is able to ambulate     Medical Decision Making The patient was brought to emergency department because of concern for hyperglycemia.  She was seen yesterday here for the same.  The concerns are for occult infection and appropriate medication ministration and appropriate diet and other  electrolyte imbalances  Electrolytes showed a glucose of 286 that on repeat was 161 after IV fluids.  Otherwise CBC and electrolytes were unremarkable.  Venous blood gas showed a normal pH  Patient will be allowed to go home and continue her current medical regimen which includes twice daily subcutaneous insulin , 20 units.  Amount and/or Complexity of Data Reviewed Labs: ordered.                 [1] Past Medical History: Diagnosis Date  . Arthritis   . Diabetes mellitus    (CMS-HCC)   . Hyperlipidemia   . Hypertension   [2] No past surgical history on file. [3] No family history on file. [4] Social History Socioeconomic History  . Marital status: Widowed  Tobacco Use  . Smoking status: Never    Passive exposure: Never  . Smokeless tobacco: Never  Vaping Use  . Vaping status: Never Used  Substance and Sexual Activity  . Alcohol use: Never  . Drug use: Never   Social Drivers of Health   Food Insecurity: No Food Insecurity (11/16/2023)   Received from [facility]   Hunger Vital Sign   . Within the past 12 months, you worried that your food would run out before you got the money to buy more.: Never true   .  Within the past 12 months, the food you bought just didn't last and you didn't have money to get more.: Never true  Tobacco Use: Low Risk  (11/18/2023)   Received from [facility]   Patient History   . Smoking Tobacco Use: Never   . Smokeless Tobacco Use: Never  Transportation Needs: No Transportation Needs (11/16/2023)   Received from [facility]   . In the past 12 months, has lack of transportation kept you from medical appointments or from getting medications?: No   . In the past 12 months, has lack of transportation kept you from meetings, work, or from getting things needed for daily living?: No  Utilities: Not At Risk (11/16/2023)   Received from [facility]   . In the past 12 months has the electric, gas,  oil, or water company threatened to shut off services in your home?: No  Social Connections: Moderately Integrated (11/16/2023)   Received from [facility]   Social Connection and Isolation Panel   . In a typical week, how many times do you talk on the phone with family, friends, or neighbors?: More than three times a week   . How often do you get together with friends or relatives?: Once a week   . How often do you attend church or religious services?: More than 4 times per year   . Do you belong to any clubs or organizations such as church groups, unions, fraternal or athletic groups, or school groups?: Yes   . How often do you attend meetings of the clubs or organizations you belong to?: More than 4 times per year   . Are you married, widowed, divorced, separated, never married, or living with a partner?: Widowed   Roots, Lyndy Beagle, MD 01/15/24 1447

## 2024-01-15 NOTE — ED Triage Notes (Signed)
 PT RECEIVED VIA EMS FROM [facility] FOR ELEVATED GLUCOSE

## 2024-01-16 ENCOUNTER — Telehealth: Payer: Self-pay | Admitting: Family Medicine

## 2024-01-16 ENCOUNTER — Encounter: Payer: Self-pay | Admitting: Family Medicine

## 2024-01-16 ENCOUNTER — Ambulatory Visit: Admitting: Orthopedic Surgery

## 2024-01-16 ENCOUNTER — Encounter: Payer: Self-pay | Admitting: Orthopedic Surgery

## 2024-01-16 ENCOUNTER — Ambulatory Visit: Admitting: Family Medicine

## 2024-01-16 ENCOUNTER — Ambulatory Visit (INDEPENDENT_AMBULATORY_CARE_PROVIDER_SITE_OTHER): Payer: Self-pay

## 2024-01-16 VITALS — BP 112/62 | HR 90 | Temp 97.9°F | Ht 64.0 in | Wt 114.0 lb

## 2024-01-16 DIAGNOSIS — S72002D Fracture of unspecified part of neck of left femur, subsequent encounter for closed fracture with routine healing: Secondary | ICD-10-CM

## 2024-01-16 DIAGNOSIS — E10622 Type 1 diabetes mellitus with other skin ulcer: Secondary | ICD-10-CM | POA: Diagnosis not present

## 2024-01-16 DIAGNOSIS — E782 Mixed hyperlipidemia: Secondary | ICD-10-CM | POA: Diagnosis not present

## 2024-01-16 DIAGNOSIS — L98499 Non-pressure chronic ulcer of skin of other sites with unspecified severity: Secondary | ICD-10-CM

## 2024-01-16 DIAGNOSIS — R739 Hyperglycemia, unspecified: Secondary | ICD-10-CM

## 2024-01-16 DIAGNOSIS — E162 Hypoglycemia, unspecified: Secondary | ICD-10-CM | POA: Diagnosis not present

## 2024-01-16 DIAGNOSIS — F02A0 Dementia in other diseases classified elsewhere, mild, without behavioral disturbance, psychotic disturbance, mood disturbance, and anxiety: Secondary | ICD-10-CM

## 2024-01-16 DIAGNOSIS — G301 Alzheimer's disease with late onset: Secondary | ICD-10-CM

## 2024-01-16 MED ORDER — INSULIN LISPRO (1 UNIT DIAL) 100 UNIT/ML (KWIKPEN): PEN_INJECTOR | SUBCUTANEOUS | 11 refills | Status: AC

## 2024-01-16 MED ORDER — TRESIBA FLEXTOUCH 100 UNIT/ML ~~LOC~~ SOPN: 25.0000 [IU] | PEN_INJECTOR | Freq: Every day | SUBCUTANEOUS | 2 refills | Status: AC

## 2024-01-16 MED ORDER — SERTRALINE HCL 50 MG PO TABS: 50.0000 mg | ORAL_TABLET | Freq: Every day | ORAL | 0 refills | Status: AC

## 2024-01-16 NOTE — Progress Notes (Signed)
 Subjective:  Patient ID: Audrey [name], female    DOB: Aug 16, 1945  Age: 78 y.o. MRN: [id number]  CC: Hyperglycemia (Insulin  was switched while in the hospital. BS is all over the place. ), Foot Injury (Place on left foot that popped up at nursing home. ), and Altered Mental Status (Daughter discretely mentioned confusion. Pravastatin  could be causing?)   HPI  Discussed the use of AI scribe software for clinical note transcription with the patient, who gave verbal consent to proceed.  History of Present Illness Audrey [name] is a 78 year old female with diabetes who presents for management of her blood sugar levels and medication review.  She has been experiencing significant fluctuations in her blood sugar levels, with readings ranging from 50 to 400. Her previous insulin  regimen included 70/30 insulin  in the morning and Tresiba  20 units at bedtime, but she has not taken Tresiba  since her recent hospitalization. During her hospital stay, her insulin  regimen was adjusted to a sliding scale. Her caregivers report that her blood sugar was 260 when she fell, and 20 minutes later, it was 360 when EMS arrived. She uses a Libre device for monitoring, but there are concerns about its accuracy. Her A1c was last recorded at 10 in September.  She underwent left hip surgery and is currently residing in a skilled nursing facility. This transition has been challenging, contributing to mood changes and possible signs of depression. She feels low and sad, which may be exacerbated by the new environment and recent surgery.  She is experiencing sleep disturbances, characterized by being awake and active at night, which is affecting her daily routine.  Her current medications include lisinopril , and she was previously on Lovenox  shots post-surgery, which have since been discontinued. There is a discussion about discontinuing pravastatin  due to concerns about its impact on memory.  She has developed a sore on  the back of her heel, which was noticed while she was in the nursing home. Her caregivers are concerned about this due to her diabetic status.          10/13/2023    8:53 AM 07/20/2023   10:51 AM 06/16/2023    8:51 AM  Depression screen PHQ 2/9  Decreased Interest 0 0 0  Down, Depressed, Hopeless 0 0 0  PHQ - 2 Score 0 0 0  Altered sleeping  0 0  Tired, decreased energy  0 0  Change in appetite  0 0  Feeling bad or failure about yourself   0 0  Trouble concentrating  0 0  Moving slowly or fidgety/restless  0 0  Suicidal thoughts  0 0  PHQ-9 Score  0  0   Difficult doing work/chores  Not difficult at all Not difficult at all     Data saved with a previous flowsheet row definition    History Audrey [name] has a past medical history of Diabetes mellitus (HCC), Hyperlipidemia, Osteopenia, Pericardial effusion, PONV (postoperative nausea and vomiting), and Seasonal allergies.   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   Her family history includes Cancer in her sister; Diabetes in her brother; Heart attack in her father; Heart disease in her brother.She reports that she has never smoked. She has never used smokeless tobacco. She reports that she does not drink alcohol and does not use drugs.    ROS Review of Systems  Constitutional: Negative.   HENT:  Negative for congestion.   Eyes:  Negative for visual disturbance.  Respiratory:  Negative for shortness of breath.   Cardiovascular:  Negative for chest pain.  Gastrointestinal:  Negative for abdominal pain, constipation, diarrhea, nausea and vomiting.  Genitourinary:  Negative for difficulty urinating.  Musculoskeletal:  Negative for arthralgias and myalgias.  Neurological:  Negative for headaches.  Psychiatric/Behavioral:  Negative for sleep disturbance.     Objective:  BP 112/62   Pulse 90   Temp  97.9 F (36.6 C)   Ht 5' 4 (1.626 m)   Wt 114 lb (51.7 kg)   SpO2 96%   BMI 19.57 kg/m   BP Readings from Last 3 Encounters:  01/16/24 112/62  11/21/23 137/66  10/13/23 139/66    Wt Readings from Last 3 Encounters:  01/16/24 114 lb (51.7 kg)  11/18/23 106 lb (48.1 kg)  10/13/23 108 lb 3.2 oz (49.1 kg)     Physical Exam Constitutional:      General: She is not in acute distress.    Appearance: She is well-developed.  Cardiovascular:     Rate and Rhythm: Normal rate and regular rhythm.  Pulmonary:     Breath sounds: Normal breath sounds.  Musculoskeletal:        General: Normal range of motion.  Skin:    General: Skin is warm and dry.     Findings: Lesion (1 cm gtrade 1 decubitus, left heel.) present.  Neurological:     Mental Status: She is alert and oriented to person, place, and time.    Physical Exam GENERAL: Alert, cooperative, well developed, no acute distress. HEENT: Normocephalic, normal oropharynx, moist mucous membranes. CHEST: Clear to auscultation bilaterally, no wheezes, rhonchi, or crackles. CARDIOVASCULAR: Normal heart rate and rhythm, S1 and S2 normal without murmurs. ABDOMEN: Soft, non-tender, non-distended, without organomegaly, normal bowel sounds. EXTREMITIES: No cyanosis or edema. NEUROLOGICAL: Cranial nerves grossly intact, moves all extremities without gross motor or sensory deficit. SKIN: Skin examined, especially foot. Dressing to be applied to foot.   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  Dispense: 90 tablet; Refill: 0 -     Tresiba  FlexTouch; Inject 25 Units  into the skin daily.  Dispense: 15 mL; Refill: 2 -     Insulin  Lispro (1 Unit Dial); Check glucose 10-15 minutes before each meal. For glucose <100 use 0 units. 101-150 use 2 units. For glucose 151-200 use 3 units. For insulin  201-250, 4 units. 251-300, 6 units. 301-350, 8 units. Over 350 , 10 units  Dispense: 15 mL; Refill: 11    Assessment and Plan Assessment & Plan Type 1 diabetes mellitus   Blood glucose levels fluctuate between 50 and 400 mg/dL, with recent hospitalization leading to insulin  regimen adjustments. She currently uses 70/30 insulin  in the morning and Tresiba  at bedtime. There are concerns about hypoglycemia, and she prefers hyperglycemia due to its lower risk of severe complications. Sliding scale insulin  is considered but not preferred due to complexity in the skilled nursing facility. Pravastatin  is discontinued. Continue 70/30 insulin  in the morning and restart Tresiba  at bedtime. Use Lispro insulin  Humalog  for sliding scale if needed. Blood glucose levels  will be monitored closely. A follow-up appointment is scheduled for December.  Depression   Depression is likely worsened by recent life changes, including hip surgery and moving to assisted living. Sertraline  is discontinued.  Insomnia   Insomnia is likely related to environmental changes and sundowning effects. Melatonin is continued, although it is not effective.  Pressure ulcer of heel   A pressure ulcer on the heel developed in the nursing home, requiring daily dressing and monitoring due to diabetes. Apply daily dressing with Neosporin and gauze, and monitor the ulcer for signs of infection or worsening.  53 minutes was spent with pt. More than 1/2 in consultation regarding mental status and diabetes control.      Follow-up: Return in about 1 month (around 02/15/2024).  Butler Der, M.D.

## 2024-01-16 NOTE — Telephone Encounter (Signed)
 Yes DC the med insulin  70/30

## 2024-01-16 NOTE — Telephone Encounter (Unsigned)
 Copied from CRM #[id number]. Topic: Clinical - Home Health Verbal Orders >> Jan 16, 2024 10:04 AM Laurier BROCKS wrote: Caller/Agency: [facility] Callback Number: [phone] Service Requested: Physical Therapy Frequency: 1 week 1, 2 week 3 weeks and 1 week 3 Any new concerns about the patient? Yes Small wound on left heel (maybe pressure ulcer)

## 2024-01-16 NOTE — Progress Notes (Signed)
    01/16/2024   Chief Complaint  Patient presents with   Post-op Follow-up    11/18/23 left hip bipolar replacement s/p fracture     No diagnosis found.  What pharmacy do you use ? ______[facility]_____________________  DOI/DOS/ Date: 11/18/23  Did you get better, worse or no change (Answer below)   Improved

## 2024-01-16 NOTE — Progress Notes (Signed)
 POST OP VISIT   Patient: Audrey [name]           Date of Birth: 04/01/1945           MRN: [id number] Visit Date: 01/16/2024 Requested by: Zollie Lowers, MD [address] [place],  [place] [address] PCP: Zollie Lowers, MD   Encounter Diagnosis  Name Primary?   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 Use as directed daily for 5 days every other week for 6 weeks., Disp: 946 mL, Rfl: 1   Cholecalciferol  (VITAMIN D -3) 125 MCG (5000 UT) TABS, Take 125 mcg by mouth daily., Disp: , Rfl:    Continuous Glucose Sensor (FREESTYLE LIBRE 3 PLUS SENSOR) MISC, Change every 14 days, Disp: 6 each, Rfl: 3   docusate sodium  (COLACE) 100 MG capsule, Take 1 capsule (100 mg total) by mouth 2 (two) times daily., Disp: , Rfl:    enoxaparin  (LOVENOX ) 40 MG/0.4ML injection, Inject 0.4 mLs (40 mg total) into the skin daily for 24 days., Disp: , Rfl:    insulin  aspart protamine - aspart (NOVOLOG  70/30 FLEXPEN) (70-30) 100 UNIT/ML FlexPen, Inject 20 Units into the skin 2 (two) times daily with a meal., Disp: , Rfl:    Insulin  Pen Needle 32G X 4 MM MISC, Use 4x a day, Disp: 100 each, Rfl: 3   Lancets (ONETOUCH ULTRASOFT) lancets, 1 each by Other route 4 (four) times daily.  E11.9, Disp: 400 each, Rfl: 0   lisinopril  (ZESTRIL ) 5 MG tablet, Take 1 tablet (5 mg total) by mouth every other day., Disp: 45 tablet, Rfl: 3   Magnesium 250 MG TABS, Take 250 mg by mouth daily., Disp: , Rfl:    Melatonin 10 MG TABS, Take 10 mg by mouth at bedtime., Disp: , Rfl:    methocarbamol  (ROBAXIN ) 500 MG tablet, Take 1 tablet (500 mg total) by mouth every 8 (eight) hours as needed for muscle spasms., Disp: , Rfl:    Multiple Vitamin (MULTIVITAMIN WITH MINERALS) TABS tablet, Take 1 tablet by mouth daily., Disp: , Rfl:    oxyCODONE  (OXY IR/ROXICODONE ) 5 MG immediate release tablet, Take 1 tablet (5 mg total) by mouth every 6 (six) hours as needed for severe pain (pain score 7-10)., Disp: 20 tablet, Rfl: 0   pravastatin  (PRAVACHOL ) 80 MG tablet, TAKE 1 TABLET BY MOUTH EVERY DAY, Disp: 90 tablet, Rfl: 0   sertraline  (ZOLOFT ) 50 MG tablet, TAKE 1 TABLET BY MOUTH EVERY DAY, Disp: 90 tablet, Rfl: 0   vitamin C (ASCORBIC ACID) 500 MG tablet, Take 500 mg by mouth daily., Disp: , Rfl:   PAIN MED: Oxycodone  listed patient not really having a lot of pain DVTPREV Lovenox   Examination mild  ankle swelling no sign of DVT  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   IMAGING: DG HIP UNILAT WITH PELVIS 2-3 VIEWS LEFT Result Date: 01/16/2024 AP pelvis bipolar replacement follow-up X-ray shows the prosthesis is in excellent position the leg lengths again appear to be equal No complication seen with the implant     ASSESSMENT AND PLAN:  78 year old female 2 months postop left hip fracture left bipolar 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  Per [facility] clinic policy, our goal is ensure optimal postoperative pain control with a multimodal pain management strategy. For all [facility] patients, our goal is to wean post-operative narcotic medications by 6 weeks post-operatively. If this is not possible due to utilization of pain medication prior to surgery, your [facility] doctor will support your acute post-operative pain control for the first 6 weeks postoperatively, with a plan to transition you back to your primary pain team following that. Audrey [name] will work to ensure a therapist, occupational.

## 2024-01-16 NOTE — Telephone Encounter (Unsigned)
 Copied from CRM [id number]. Topic: Clinical - Medication Question >> Jan 16, 2024  4:15 PM Antwanette L wrote: Reason for CRM: Lauraine, a nurse from [facility], is calling to request that Dr. Zollie fax an order confirming the discontinuation of Novolog  70/30. The order should be faxed to [phone]. Lauraine can be reached at [phone].

## 2024-01-17 ENCOUNTER — Telehealth: Payer: Self-pay | Admitting: Family Medicine

## 2024-01-17 ENCOUNTER — Other Ambulatory Visit: Payer: Self-pay | Admitting: Family Medicine

## 2024-01-17 NOTE — Telephone Encounter (Signed)
 Copied from CRM [id number]. Topic: Clinical - Home Health Verbal Orders >> Jan 17, 2024  2:17 PM Leonette SQUIBB wrote: Caller/Agency: Liji  with  [facility]  Callback Number: [phone]  Service Requested: Physical Therapy Frequency: 1 week 1 week , 2 week 3 and 1 week 3  Any new concerns about the patient? Yes  nursing orders for a pressure wound left heel.

## 2024-01-17 NOTE — Telephone Encounter (Signed)
 I called and spoke with Liji and gave her verbal approval for PT orders and Nursing order.

## 2024-01-17 NOTE — Telephone Encounter (Signed)
 Faxed this morning 11/11 LS

## 2024-01-18 ENCOUNTER — Inpatient Hospital Stay: Admitting: Family Medicine

## 2024-01-18 ENCOUNTER — Telehealth: Payer: Self-pay

## 2024-01-18 NOTE — Telephone Encounter (Signed)
 They have been notified. LS

## 2024-01-18 NOTE — Telephone Encounter (Signed)
 Copied from CRM [id number]. Topic: Clinical - Medication Question >> Jan 18, 2024  1:56 PM Emylou G wrote: Reason for CRM: Sarah w/[facility] ( Nurse ) where she lives.. patients sugar is 275.. she is late taking the humalog .. should she wait til before dinner to take it so it doesn't bottom her out - instead of taking it now.. Pls call her back [phone]

## 2024-01-18 NOTE — Telephone Encounter (Signed)
 They cannot be late giving the humalog  or else the plan will not work!!!!!!!!!! Do not give it randomly. Give it right before a meal.

## 2024-01-23 ENCOUNTER — Encounter: Payer: Self-pay | Admitting: Family Medicine

## 2024-01-25 ENCOUNTER — Telehealth: Payer: Self-pay

## 2024-01-25 ENCOUNTER — Other Ambulatory Visit (HOSPITAL_COMMUNITY): Payer: Self-pay

## 2024-01-25 ENCOUNTER — Other Ambulatory Visit: Payer: Self-pay | Admitting: Family Medicine

## 2024-01-25 MED ORDER — MAGNESIUM GLYCINATE 100 MG PO CAPS: 100.0000 mg | ORAL_CAPSULE | Freq: Every day | ORAL | 3 refills | Status: AC

## 2024-01-25 NOTE — Telephone Encounter (Signed)
 I sent a scrip for magnesium  glycinate to the pharmacy in [place]. Please send verbal order to Fredick quin one at bedtime for sleep.

## 2024-01-25 NOTE — Telephone Encounter (Signed)
Letter faxed.    LS

## 2024-01-25 NOTE — Telephone Encounter (Signed)
 Please call the facility and DC her pravastatin . Thanks.

## 2024-01-26 ENCOUNTER — Ambulatory Visit

## 2024-01-26 DIAGNOSIS — M80052D Age-related osteoporosis with current pathological fracture, left femur, subsequent encounter for fracture with routine healing: Secondary | ICD-10-CM

## 2024-01-26 DIAGNOSIS — Z96642 Presence of left artificial hip joint: Secondary | ICD-10-CM

## 2024-01-26 DIAGNOSIS — L89622 Pressure ulcer of left heel, stage 2: Secondary | ICD-10-CM

## 2024-01-26 DIAGNOSIS — E1065 Type 1 diabetes mellitus with hyperglycemia: Secondary | ICD-10-CM | POA: Diagnosis not present

## 2024-01-26 DIAGNOSIS — I10 Essential (primary) hypertension: Secondary | ICD-10-CM | POA: Diagnosis not present

## 2024-01-26 DIAGNOSIS — Z9181 History of falling: Secondary | ICD-10-CM

## 2024-01-26 DIAGNOSIS — E440 Moderate protein-calorie malnutrition: Secondary | ICD-10-CM | POA: Diagnosis not present

## 2024-02-07 ENCOUNTER — Telehealth: Payer: Self-pay | Admitting: Family Medicine

## 2024-02-07 NOTE — Telephone Encounter (Unsigned)
 Copied from CRM #[id number]. Topic: Clinical - Home Health Verbal Orders >> Feb 07, 2024 11:29 AM Zebedee SAUNDERS wrote: Caller/Agency: [facility] per Rosaline Lent Callback Number: [phone] secure line Service Requested: Occupational Therapy Frequency: 02/13/2024, 2xweek for 2w , 1xweek for 2weeks Any new concerns about the patient? No

## 2024-02-07 NOTE — Telephone Encounter (Signed)
 Verbal orders given on voice mail. LS

## 2024-02-09 ENCOUNTER — Telehealth: Payer: Self-pay | Admitting: Family Medicine

## 2024-02-09 ENCOUNTER — Ambulatory Visit: Payer: Self-pay

## 2024-02-09 NOTE — Telephone Encounter (Signed)
 Left message on secure voicemail giving verbal orders for PT. LS

## 2024-02-09 NOTE — ED Provider Notes (Signed)
 ------------------------------------------------------------------------------- Attestation signed by Cherie Ardeen Hanger, MD at 02/11/24 [phone] I was the attending physician on duty, and was available in Emergency Department for any consultations. I did not personally care for this patient. The patient was managed by the mid-level provider. I am co-signing the chart as the attending physician.   Signed by Ardeen SHAUNNA Cherie, MD February 11, 2024 at 6:18 AM  -------------------------------------------------------------------------------                                                                                     Emergency Department Provider Note    ED Clinical Impression   Final diagnoses:  Type 1 diabetes mellitus with hyperglycemia (CMS-HCC) (Primary)    ED Assessment/Plan    Condition: Stable Disposition: Discharge  This chart has been completed using Dragon Medical Dictation software, and while attempts have been made to ensure accuracy, certain words and phrases may not be transcribed as intended.   History   Chief Complaint  Patient presents with   Elevated Blood Glucose Symptomatic   HPI  Audrey [name] is a 78 y.o. female with history of Alzheimer's, DM1, HLD presents to the emergency department for evaluation of high blood sugar.  Patient is somewhat a poor historian secondary to Alzheimer's.  History taken by EMS who just transferred the patient from nursing facility.  Patient reportedly has been eating the peach cobbler and had blood sugar checked noted to be high.  Patient was sent over for further evaluation given hyperglycemia.  Patient reports feeling well when asked, has no complaints but does feel like her blood sugars running high.    Allergies: is allergic to morphine, codeine, and sitagliptin. Medications: has a current medication list which includes the following long-term medication(s): aspirin, prolia , ibandronate , insulin  aspart, lisinopril ,  metoprolol  tartrate, pravastatin , and sertraline . PMHx:  has a past medical history of Arthritis, Diabetes mellitus    (CMS-HCC), Hyperlipidemia, and Hypertension. PSHx:  has no past surgical history on file. SocHx:  reports that she has never smoked. She has never been exposed to tobacco smoke. She has never used smokeless tobacco. She reports that she does not drink alcohol and does not use drugs. Allergies, Medications, Medical, Surgical, and Social History were reviewed as documented above.   Social Drivers of Health with Concerns   Alcohol Use: Not on file  Housing: Not on file  Physical Activity: Unknown (01/16/2024)   Received from [facility]   Exercise Vital Sign    On average, how many days per week do you engage in moderate to strenuous exercise (like a brisk walk)?: Patient declined    Minutes of Exercise per Session: Not on file  Interpersonal Safety: Not on file  Substance Use: Not on file (01/12/2023)  Social Connections: Unknown (01/16/2024)   Received from [facility]   Social Connection and Isolation Panel    In a typical week, how many times do you talk on the phone with family, friends, or neighbors?: Patient declined    How often do you get together with friends or relatives?: Patient declined    How often do you attend church or religious services?: Patient declined    Do  you belong to any clubs or organizations such as church groups, unions, fraternal or athletic groups, or school groups?: Patient declined    Attends Banker Meetings: Not on file    Are you married, widowed, divorced, separated, never married, or living with a partner?: Patient declined  Health Literacy: Not on file  Internet Connectivity: Not on file     Review Of Systems  Review of Systems  Physical Exam   BP 122/47   Pulse 78   Temp 36.7 C (98.1 F) (Oral)   Resp 17   Ht 165.1 cm (5' 5)   Wt 49.9 kg (110 lb)   SpO2 98%   BMI 18.30 kg/m   Physical  Exam Vitals reviewed.  Constitutional:      General: She is not in acute distress.    Appearance: Normal appearance. She is normal weight. She is not ill-appearing or toxic-appearing.  HENT:     Head: Normocephalic.     Mouth/Throat:     Mouth: Mucous membranes are moist.  Eyes:     Extraocular Movements: Extraocular movements intact.     Conjunctiva/sclera: Conjunctivae normal.  Cardiovascular:     Rate and Rhythm: Normal rate and regular rhythm.     Heart sounds: Normal heart sounds.  Pulmonary:     Effort: Pulmonary effort is normal.     Breath sounds: Normal breath sounds.  Abdominal:     General: Abdomen is flat. There is no distension.     Palpations: Abdomen is soft.     Tenderness: There is no abdominal tenderness. There is no guarding.  Musculoskeletal:     Cervical back: Normal range of motion.  Neurological:     General: No focal deficit present.     Mental Status: She is alert. Mental status is at baseline.  Psychiatric:        Mood and Affect: Mood normal.        Behavior: Behavior normal.     ED Course  Medical Decision Making Ddx: Hyperglycemia, medication side effect, DM1 complication, DKA/HHS, dehydration, acute infection  Afebrile, nontoxic-appearing 78 year old female presents to the emergency department for evaluation of hyperglycemia.  Initial vitals reassuring, unremarkable.  Physical exam without acute findings.  Patient is answering most questions appropriately with history of Alzheimer's, GCS 14-15, overall nontoxic-appearing.  Will continue IV fluids, were started by EMS prior to arrival.  Initial glucose noted to be in the 500s with repeat on the 600s.  Patient received a subsequent IV fluid bolus as well as 2 boluses of insulin  with improvement to 11.  VBG without acid-base abnormality.  No overt signs of infectious etiology, urine specimen does not appear consistent with UTI.  Patient reports feeling improved on subsequent evaluation.  Vital signs  remained overall reassuring.  Patient stable for discharge back to nursing facility.  Will recommend close follow-up for continued glycemic control.  Discussed lab results and imaging findings with patient who is agreeable to discharge at this time.  Patient advised return for new or worsening symptoms.  Amount and/or Complexity of Data Reviewed Labs: ordered. Decision-making details documented in ED Course.  Risk OTC drugs.     Procedures   No results found for this visit on 02/09/24 (from the past 4464 hours).  ED Course as of 02/09/24 1955  Thu Feb 09, 2024  1545 Hyperglycemia 541.  1549 VBG with normal pH.  Mildly elevated bicarb.  1600 CBC shows no leukocytosis, H&H stable.  1621 Magnesium  unremarkable.  1627  Pseudohyponatremia 134, hyperkalemia 5.1, hyperglycemia 628, mildly elevated creatinine 1.24, no transaminitis.  Will give 10 units of insulin .  1656 Urine specimen without ketones, does not appear consistent with UTI.  1815 Repeat glucose 399.  1952 Hyperglycemia improved 211.     ED Results Results for orders placed or performed during the hospital encounter of 02/09/24  Comprehensive Metabolic Panel  Result Value Ref Range   Sodium 134 (L) 135 - 145 mmol/L   Potassium 5.1 (H) 3.5 - 5.0 mmol/L   Chloride 97 (L) 98 - 107 mmol/L   CO2 30.8 21.0 - 32.0 mmol/L   Anion Gap 6 3 - 11 mmol/L   BUN 19 8 - 20 mg/dL   Creatinine 8.75 (H) 9.39 - 1.10 mg/dL   BUN/Creatinine Ratio 15    eGFR CKD-EPI (2021) Female 45 (L) >=60 mL/min/1.58m2   Glucose 628 (HH) 70 - 179 mg/dL   Calcium 9.2 8.5 - 89.8 mg/dL   Albumin 3.4 (L) 3.5 - 5.0 g/dL   Total Protein 6.3 6.0 - 8.0 g/dL   Total Bilirubin 0.4 0.3 - 1.2 mg/dL   AST 16 15 - 40 U/L   ALT 16 12 - 78 U/L   Alkaline Phosphatase 110 46 - 116 U/L  Magnesium   Result Value Ref Range   Magnesium  1.8 1.6 - 2.6 mg/dL  POCT Glucose  Result Value Ref Range   Glucose, POC 541 (HH) 70 - 105 mg/dL  Venous Blood Gas  Result Value Ref  Range   pH, Venous 7.38 7.32 - 7.43   pCO2, Ven 52.2 40.0 - 60.0 mmHg   pO2, Ven 35.6 30.0 - 55.0 mmHg   HCO3, Ven 31.0 (H) 22.0 - 27.0 mmol/L   Base Excess, Ven 4.8 (H) -2.0 - 3.0 mmol/L   O2 Saturation, Venous 66.5 40.0 - 85.0 %   Operator ID    POCT Glucose  Result Value Ref Range   Glucose, POC 399 (H) 70 - 105 mg/dL  POCT Glucose  Result Value Ref Range   Glucose, POC 211 (H) 70 - 105 mg/dL  CBC w/ Differential  Result Value Ref Range   WBC 4.6 4.0 - 10.5 10*9/L   RBC 4.03 3.80 - 5.10 10*12/L   HGB 11.5 11.5 - 15.0 g/dL   HCT 64.9 65.9 - 55.9 %   MCV 86.8 80.0 - 98.0 fL   MCH 28.5 27.0 - 34.0 pg   MCHC 32.9 32.0 - 36.0 g/dL   RDW 86.6 88.4 - 85.4 %   MPV 9.7 7.4 - 10.4 fL   Platelet 239 140 - 415 10*9/L   Neutrophils % 73.1 %   Lymphocytes % 15.2 %   Monocytes % 8.8 %   Eosinophils % 2.0 %   Basophils % 0.7 %   Absolute Neutrophils 3.3 1.8 - 7.8 10*9/L   Absolute Lymphocytes 0.7 0.7 - 4.5 10*9/L   Absolute Monocytes 0.4 0.1 - 1.0 10*9/L   Absolute Eosinophils 0.1 0.0 - 0.4 10*9/L   Absolute Basophils 0.0 0.0 - 0.2 10*9/L  Urinalysis with Microscopy with Culture Reflex  Result Value Ref Range   Color, UA Colorless    Clarity, UA Clear Clear   Specific Gravity, UA 1.023 1.010 - 1.025   pH, UA 6.5 5.0 - 8.0   Leukocyte Esterase, UA Negative Negative   Nitrite, UA Negative Negative   Protein, UA Negative Negative   Glucose, UA >1000 mg/dL (A) Negative, Trace   Ketones, UA Negative Negative   Urobilinogen, UA <2.0  mg/dL <7.9 mg/dL   Bilirubin, UA Negative Negative   Blood, UA Negative Negative   RBC, UA 0 0 - 3 /HPF   WBC, UA 0 0 - 3 /HPF   Squam Epithel, UA 1 0 - 10 /HPF   Bacteria, UA None Seen None Seen /HPF   WBC Clumps None Seen None Seen /HPF   Hyphal Yeast None Seen None Seen /HPF   Yeast, UA None Seen None Seen /HPF   No results found.  Medications Administered:  Medications  lactated ringers  bolus 1,000 mL (0 mL Intravenous Stopped 02/09/24 1702)   insulin  regular (HumuLIN,NovoLIN) injection 10 Units (10 Units Intravenous Given 02/09/24 1650)  insulin  regular (HumuLIN,NovoLIN) injection 6 Units (6 Units Intravenous Given 02/09/24 1835)    Discharge Medications (Medications Prescribed during this  ED visit and Patient's Home Medications) :    Your Medication List     ASK your doctor about these medications    ascorbic acid 500 MG tablet Generic drug: ascorbic acid (vitamin C) 1 tablet   aspirin 81 MG chewable tablet 1 tablet   calcium carbonate-vitamin D3 600 mg-20 mcg (800 unit) Tab Take by mouth.   diphenhydrAMINE  25 mg capsule Commonly known as: BENADRYL  Take 25 mg by mouth.   ibandronate  150 mg tablet Commonly known as: BONIVA    lisinopril  5 MG tablet Commonly known as: PRINIVIL ,ZESTRIL  lisinopril  5 mg tablet   meloxicam  15 MG tablet Commonly known as: MOBIC  meloxicam  15 mg tablet   meloxicam  15 MG tablet Commonly known as: MOBIC  Take 1 tablet (15 mg total) by mouth daily. Take with food   metoPROLOL  tartrate 25 MG tablet Commonly known as: Lopressor  metoprolol  tartrate 25 mg tablet   NovoLOG  U-100 Insulin  aspart 100 unit/mL vial Generic drug: insulin  aspart Novolog  U-100 Insulin  aspart 100 unit/mL subcutaneous solution   pravastatin  80 MG tablet Commonly known as: PRAVACHOL    PROLIA  60 mg/mL Syrg Generic drug: denosumab  60 mg   sertraline  100 MG tablet Commonly known as: ZOLOFT  sertraline  100 mg tablet          Kopel, Andrew Lee, [place] 02/09/24 1955

## 2024-02-09 NOTE — Telephone Encounter (Signed)
 Patient aware and agreed to go to the ER

## 2024-02-09 NOTE — Telephone Encounter (Unsigned)
 Copied from CRM #[id number]. Topic: Clinical - Home Health Verbal Orders >> Feb 09, 2024 10:04 AM Willma SAUNDERS wrote: Caller/Agency: Amy / [facility] Callback Number: [phone] Service Requested: Physical Therapy Frequency: extend PT for 1 time a week for 2 weeks Any new concerns about the patient? No

## 2024-02-09 NOTE — Telephone Encounter (Signed)
 FYI Only or Action Required?: FYI only for provider: ED advised.  Patient was last seen in primary care on 01/16/2024 by Audrey Lowers, MD.  Called Nurse Triage reporting Blood Sugar Problem.  Symptoms began today.  Interventions attempted: Nothing.  Symptoms are: unchanged.  Triage Disposition: Go to ED Now (or PCP Triage)  Patient/caregiver understands and will follow disposition?: Yes  Copied from CRM [id number]. Topic: Clinical - Red Word Triage >> Feb 09, 2024  2:17 PM Audrey [name] wrote: Red Word that prompted transfer to Nurse Triage: sugar over 600 manual Reason for Disposition  Blood glucose > 500 mg/dL (72.1 mmol/L)  Answer Assessment - Initial Assessment Questions Patient's blood sugar Reading as over 600, machine can not accurately read because 600 is max. Med tech states patient is breathing like normal. Alert and aware. Instructed patient needs to go to hospital due to high reading and med tech verbalizes understanding.  1. BLOOD GLUCOSE: What is your blood glucose level?      Over 600 2. ONSET: When did you check the blood glucose?     Just now  Protocols used: Diabetes - High Blood Sugar-A-AH

## 2024-02-16 ENCOUNTER — Ambulatory Visit: Admitting: Family Medicine

## 2024-02-16 ENCOUNTER — Encounter: Payer: Self-pay | Admitting: Family Medicine

## 2024-02-16 VITALS — BP 124/68 | HR 86 | Temp 97.0°F | Ht 64.0 in | Wt 114.0 lb

## 2024-02-16 DIAGNOSIS — Z111 Encounter for screening for respiratory tuberculosis: Secondary | ICD-10-CM

## 2024-02-16 DIAGNOSIS — E1065 Type 1 diabetes mellitus with hyperglycemia: Secondary | ICD-10-CM | POA: Diagnosis not present

## 2024-02-16 DIAGNOSIS — F02B0 Dementia in other diseases classified elsewhere, moderate, without behavioral disturbance, psychotic disturbance, mood disturbance, and anxiety: Secondary | ICD-10-CM

## 2024-02-16 DIAGNOSIS — G301 Alzheimer's disease with late onset: Secondary | ICD-10-CM | POA: Diagnosis not present

## 2024-02-16 DIAGNOSIS — R739 Hyperglycemia, unspecified: Secondary | ICD-10-CM

## 2024-02-17 LAB — BAYER DCA HB A1C WAIVED: HB A1C (BAYER DCA - WAIVED): 9.5 % — ABNORMAL HIGH (ref 4.8–5.6)

## 2024-02-18 ENCOUNTER — Encounter: Payer: Self-pay | Admitting: Family Medicine

## 2024-02-18 ENCOUNTER — Ambulatory Visit: Payer: Self-pay | Admitting: Family Medicine

## 2024-02-18 LAB — QUANTIFERON-TB GOLD PLUS

## 2024-02-18 NOTE — Progress Notes (Signed)
 Subjective:  Patient ID: Audrey [name], female    DOB: 05/09/1945  Age: 78 y.o. MRN: [id number]  CC: Medical Management of Chronic Issues (Form completion and TB blood test)   HPI  Discussed the use of AI scribe software for clinical note transcription with the patient, who gave verbal consent to proceed.  History of Present Illness Audrey [name] is a 78 year old female with Type 1 diabetes who presents with frequent ER visits due to uncontrolled blood sugar levels.  She has experienced three trips to the ER in the last month due to uncontrolled blood sugar levels. Her blood sugar readings have been highly variable, with levels dropping to the fifties and sixties and rising above 350, which is the maximum reading on her glucose monitor.  Her caregiver reports that there have been challenges with dietary choices and managing her insulin  regimen at the assisted living facility where she resides. She has been living at her current assisted living facility for about a month. The facility has struggled to provide a consistent diabetic diet, and there have been issues with timely administration of insulin , according to her caregiver. Her caregiver reports that communication delays regarding insulin  dosing have also been problematic.  Her most recent A1c test, conducted three months ago, was 10. She is due for another A1c test today. There have been instances of dehydration, which have exacerbated her blood sugar issues, leading to hospital visits where she received IV fluids.          10/13/2023    8:53 AM 07/20/2023   10:51 AM 06/16/2023    8:51 AM  Depression screen PHQ 2/9  Decreased Interest 0 0 0  Down, Depressed, Hopeless 0 0 0  PHQ - 2 Score 0 0 0  Altered sleeping  0 0  Tired, decreased energy  0 0  Change in appetite  0 0  Feeling bad or failure about yourself   0 0  Trouble concentrating  0 0  Moving slowly or fidgety/restless  0 0  Suicidal thoughts  0 0  PHQ-9 Score  0   0   Difficult doing work/chores  Not difficult at all Not difficult at all     Data saved with a previous flowsheet row definition    History Audrey [name] has a past medical history of Diabetes mellitus (HCC), Hyperlipidemia, Osteopenia, Pericardial effusion, PONV (postoperative nausea and vomiting), and Seasonal allergies.   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   Her family history includes Cancer in her sister; Diabetes in her brother; Heart attack in her father; Heart disease in her brother.She reports that she has never smoked. She has never used smokeless tobacco. She reports that she does not drink alcohol and does not use drugs.    ROS Review of Systems  Constitutional: Negative.   HENT:  Negative for congestion.   Eyes:  Negative for visual disturbance.  Respiratory:  Negative for shortness of breath.   Cardiovascular:  Negative for chest pain.  Gastrointestinal:  Negative for abdominal pain, constipation, diarrhea, nausea and vomiting.  Genitourinary:  Negative for difficulty urinating.  Musculoskeletal:  Negative for arthralgias and myalgias.  Neurological:  Negative for headaches.  Psychiatric/Behavioral:  Negative for sleep disturbance.     Objective:  BP 124/68   Pulse 86   Temp (!) 97 F (36.1 C)   Ht 5' 4 (1.626 m)  Wt 114 lb (51.7 kg)   SpO2 95%   BMI 19.57 kg/m   BP Readings from Last 3 Encounters:  02/16/24 124/68  01/16/24 112/62  11/21/23 137/66    Wt Readings from Last 3 Encounters:  02/16/24 114 lb (51.7 kg)  01/16/24 114 lb (51.7 kg)  11/18/23 106 lb (48.1 kg)     Physical Exam Physical Exam GENERAL: Alert, cooperative, well developed, no acute distress. HEENT: Normocephalic, normal oropharynx, moist mucous membranes. CHEST: Clear to auscultation bilaterally, no wheezes, rhonchi, or  crackles. CARDIOVASCULAR: Normal heart rate and rhythm, S1 and S2 normal without murmurs. ABDOMEN: Soft, non-tender, non-distended, without organomegaly, normal bowel sounds. EXTREMITIES: No cyanosis or edema. NEUROLOGICAL: Cranial nerves grossly intact, moves all extremities without gross motor or sensory deficit.   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 Current sliding scale insulin  management is ineffective due to dietary choices and timing issues. Dehydration contributes to hyperglycemia. An A1c test and a blood test for a skin test are ordered. Facility physician involvement will be considered for improved diabetes management, and alternative insulin  strategies will be discussed.  Late onset Alzheimer's disease   She has mild late onset Alzheimer's dementia without behavioral, psychotic, mood disturbances, or anxiety. Memory issues complicate diabetes management, especially with sliding scale insulin .  General health maintenance   A facility change to [facility] is facilitated for better memory support. An updated skin test is ordered due to the facility change.       Follow-up: Return if symptoms worsen or fail to improve.  Butler Der, M.D.

## 2024-02-20 LAB — QUANTIFERON-TB GOLD PLUS

## 2024-03-15 ENCOUNTER — Ambulatory Visit: Payer: Self-pay | Admitting: Family Medicine

## 2024-03-15 ENCOUNTER — Telehealth: Payer: Self-pay | Admitting: Pharmacist

## 2024-03-15 NOTE — Telephone Encounter (Signed)
 BG> 400 per E2C2 call:  Returned call to patient's nurse at [phone] per E2C2 call No answer Left VM encouraging return call  Patient does have Humalog  (lispro) sliding  scale:  Check glucose 10-15 minutes before each meal. For glucose <100 use 0 units. 101-150 use 2 units. For glucose 151-200 use 3 units. For insulin  201-250, 4 units. 251-300, 6 units. 301-350, 8 units. Over 350 , 10 units, Normal   She needs to administer lispro Appears to have already given Tresiba  today  Please f/u with patient/home health nurse

## 2024-03-15 NOTE — Telephone Encounter (Signed)
" °  FYI Only or Action Required?: Action required by provider: clinical question for provider and update on patient condition.  Patient was last seen in primary care on 02/16/2024 by Audrey Lowers, MD.  Called Nurse Triage reporting Hyperglycemia.  Symptoms began several days ago.  Interventions attempted: Prescription medications: Tresiba  and Lispro.  Symptoms are: gradually worsening.  Triage Disposition: Call PCP Now  Patient/caregiver understands and will follow disposition?: Yes  Copied from CRM [id number]. Topic: Clinical - Red Word Triage >> Mar 15, 2024 11:41 AM Audrey [name] wrote: Red Word that prompted transfer to Nurse Triage:   Blood sugar 454 and dr stated to notify if it is over 451 and above. Reason for Disposition  Blood glucose > 400 mg/dL (77.7 mmol/L)  Answer Assessment - Initial Assessment Questions 1. BLOOD GLUCOSE: What is your blood glucose level?      454 x five minutes ago  2. ONSET: When did you check the blood glucose?     X days   5. TYPE 1 or 2:  Do you know what type of diabetes you have?  (e.g., Type 1, Type 2, Gestational; doesn't know)      Type 1  6. INSULIN : Do you take insulin ? What type of insulin (s) do you use? What is the mode of delivery? (syringe, pen; injection or pump)?      Tresiba  and Lispro  7. DIABETES PILLS: Do you take any pills for your diabetes? If Yes, ask: Have you missed taking any pills recently?     None  8. OTHER SYMPTOMS: Do you have any symptoms? (e.g., fever, frequent urination, difficulty breathing, dizziness, weakness, vomiting)     Pt's nurse states pt is alert and oriented, preparing to eat   Pt's nurse reports Hyperglycemia Pt's nurse, [name], states patient was given Tresiba  25 units, however, has not done Lispro yet. Pt's nurse unable to give time of injection due to computer lag.  Routing to clinic as PCP is following pt's glucose readings and informed pt to update if glucose is over  451. Routing to clinic for update and further plan of care Pt's nurse can be reached at [phone]  Protocols used: Diabetes - High Blood Sugar-A-AH  "

## 2024-03-15 NOTE — Telephone Encounter (Signed)
 How much lispro that she normally give.  Ask her every meal how much she takes, I know you mention sliding scale but see if she has scheduled lispro every time she eats

## 2024-03-16 ENCOUNTER — Ambulatory Visit: Admitting: Orthopedic Surgery

## 2024-03-16 NOTE — Telephone Encounter (Signed)
 Left message with patients nurse to call back.

## 2024-03-22 NOTE — Telephone Encounter (Signed)
 Left message with patients nurse to call back.
